# Patient Record
Sex: Male | Born: 1960 | Race: Black or African American | Hispanic: No | Marital: Married | Smoking: Light tobacco smoker
Health system: Southern US, Community
[De-identification: ages and names within clinical notes are randomized; demographics above are authoritative.]

## PROBLEM LIST (undated history)

## (undated) DIAGNOSIS — M1990 Unspecified osteoarthritis, unspecified site: Secondary | ICD-10-CM

## (undated) DIAGNOSIS — E785 Hyperlipidemia, unspecified: Secondary | ICD-10-CM

## (undated) DIAGNOSIS — I10 Essential (primary) hypertension: Secondary | ICD-10-CM

## (undated) DIAGNOSIS — T7840XA Allergy, unspecified, initial encounter: Secondary | ICD-10-CM

## (undated) DIAGNOSIS — R634 Abnormal weight loss: Secondary | ICD-10-CM

## (undated) DIAGNOSIS — E119 Type 2 diabetes mellitus without complications: Secondary | ICD-10-CM

## (undated) HISTORY — DX: Allergy, unspecified, initial encounter: T78.40XA

## (undated) HISTORY — PX: COLONOSCOPY: SHX174

## (undated) HISTORY — PX: LAPAROSCOPIC GASTRIC SLEEVE RESECTION: SHX5895

---

## 2010-07-23 ENCOUNTER — Inpatient Hospital Stay (HOSPITAL_COMMUNITY): Admission: EM | Admit: 2010-07-23 | Discharge: 2010-07-27 | Payer: Self-pay | Admitting: Emergency Medicine

## 2010-08-10 ENCOUNTER — Encounter: Admission: RE | Admit: 2010-08-10 | Discharge: 2010-08-20 | Payer: Self-pay | Admitting: Internal Medicine

## 2011-02-04 LAB — DIFFERENTIAL
Eosinophils Absolute: 0 10*3/uL (ref 0.0–0.7)
Eosinophils Relative: 0 % (ref 0–5)
Lymphs Abs: 2.7 10*3/uL (ref 0.7–4.0)
Monocytes Relative: 8 % (ref 3–12)
Neutrophils Relative %: 65 % (ref 43–77)

## 2011-02-04 LAB — GLUCOSE, CAPILLARY
Glucose-Capillary: 156 mg/dL — ABNORMAL HIGH (ref 70–99)
Glucose-Capillary: 163 mg/dL — ABNORMAL HIGH (ref 70–99)
Glucose-Capillary: 163 mg/dL — ABNORMAL HIGH (ref 70–99)
Glucose-Capillary: 177 mg/dL — ABNORMAL HIGH (ref 70–99)
Glucose-Capillary: 185 mg/dL — ABNORMAL HIGH (ref 70–99)
Glucose-Capillary: 187 mg/dL — ABNORMAL HIGH (ref 70–99)
Glucose-Capillary: 201 mg/dL — ABNORMAL HIGH (ref 70–99)
Glucose-Capillary: 228 mg/dL — ABNORMAL HIGH (ref 70–99)
Glucose-Capillary: 237 mg/dL — ABNORMAL HIGH (ref 70–99)
Glucose-Capillary: 274 mg/dL — ABNORMAL HIGH (ref 70–99)
Glucose-Capillary: 295 mg/dL — ABNORMAL HIGH (ref 70–99)
Glucose-Capillary: 323 mg/dL — ABNORMAL HIGH (ref 70–99)
Glucose-Capillary: 372 mg/dL — ABNORMAL HIGH (ref 70–99)
Glucose-Capillary: 395 mg/dL — ABNORMAL HIGH (ref 70–99)
Glucose-Capillary: 395 mg/dL — ABNORMAL HIGH (ref 70–99)
Glucose-Capillary: 411 mg/dL — ABNORMAL HIGH (ref 70–99)
Glucose-Capillary: 424 mg/dL — ABNORMAL HIGH (ref 70–99)
Glucose-Capillary: 532 mg/dL — ABNORMAL HIGH (ref 70–99)
Glucose-Capillary: 579 mg/dL (ref 70–99)
Glucose-Capillary: >600 mg/dL (ref 70–99)

## 2011-02-04 LAB — POCT I-STAT, CHEM 8
Creatinine, Ser: 1.6 mg/dL — ABNORMAL HIGH (ref 0.4–1.5)
Glucose, Bld: 674 mg/dL (ref 70–99)
Hemoglobin: 15.3 g/dL (ref 13.0–17.0)
Potassium: 3.9 mEq/L (ref 3.5–5.1)

## 2011-02-04 LAB — CBC
HCT: 34.7 % — ABNORMAL LOW (ref 39.0–52.0)
HCT: 36.1 % — ABNORMAL LOW (ref 39.0–52.0)
Hemoglobin: 13.9 g/dL (ref 13.0–17.0)
MCH: 27.9 pg (ref 26.0–34.0)
MCHC: 34.3 g/dL (ref 30.0–36.0)
MCHC: 34.3 g/dL (ref 30.0–36.0)
MCV: 77.8 fL — ABNORMAL LOW (ref 78.0–100.0)
MCV: 78 fL (ref 78.0–100.0)
MCV: 81.2 fL (ref 78.0–100.0)
Platelets: 178 10*3/uL (ref 150–400)
Platelets: 185 10*3/uL (ref 150–400)
RBC: 4.99 MIL/uL (ref 4.22–5.81)
RDW: 14.5 % (ref 11.5–15.5)
RDW: 14.6 % (ref 11.5–15.5)
WBC: 9.6 10*3/uL (ref 4.0–10.5)

## 2011-02-04 LAB — BASIC METABOLIC PANEL
BUN: 14 mg/dL (ref 6–23)
CO2: 27 mEq/L (ref 19–32)
Calcium: 9.9 mg/dL (ref 8.4–10.5)
Chloride: 106 mEq/L (ref 96–112)
Creatinine, Ser: 1.13 mg/dL (ref 0.4–1.5)
GFR calc Af Amer: 46 mL/min — ABNORMAL LOW (ref >60)
GFR calc Af Amer: >60 mL/min (ref >60)
GFR calc non Af Amer: 38 mL/min — ABNORMAL LOW (ref >60)
GFR calc non Af Amer: >60 mL/min (ref >60)
Glucose, Bld: 374 mg/dL — ABNORMAL HIGH (ref 70–99)
Potassium: 3.3 mEq/L — ABNORMAL LOW (ref 3.5–5.1)
Potassium: 4 mEq/L (ref 3.5–5.1)
Sodium: 136 mEq/L (ref 135–145)
Sodium: 139 mEq/L (ref 135–145)

## 2011-02-04 LAB — CARDIAC PANEL(CRET KIN+CKTOT+MB+TROPI)
CK, MB: 4.8 ng/mL — ABNORMAL HIGH (ref 0.3–4.0)
Relative Index: 1.6 (ref 0.0–2.5)
Total CK: 291 U/L — ABNORMAL HIGH (ref 7–232)
Troponin I: 0.03 ng/mL (ref 0.00–0.06)
Troponin I: 0.03 ng/mL (ref 0.00–0.06)

## 2011-02-04 LAB — HEPATIC FUNCTION PANEL
Albumin: 3.6 g/dL (ref 3.5–5.2)
Alkaline Phosphatase: 59 U/L (ref 39–117)
Indirect Bilirubin: 0.8 mg/dL (ref 0.3–0.9)
Total Bilirubin: 1 mg/dL (ref 0.3–1.2)
Total Protein: 6.6 g/dL (ref 6.0–8.3)

## 2011-02-04 LAB — LIPID PANEL
Cholesterol: 191 mg/dL (ref 0–200)
HDL: 37 mg/dL — ABNORMAL LOW (ref >39)
LDL Cholesterol: 121 mg/dL — ABNORMAL HIGH (ref 0–99)
Total CHOL/HDL Ratio: 5.2 RATIO

## 2011-02-04 LAB — URINALYSIS, ROUTINE W REFLEX MICROSCOPIC
Ketones, ur: 40 mg/dL — AB
Leukocytes, UA: NEGATIVE
Nitrite: NEGATIVE
Protein, ur: NEGATIVE mg/dL

## 2011-02-04 LAB — KETONES, QUALITATIVE

## 2011-02-04 LAB — HEMOGLOBIN A1C: Mean Plasma Glucose: 358 mg/dL — ABNORMAL HIGH (ref <117)

## 2011-04-13 ENCOUNTER — Ambulatory Visit (HOSPITAL_COMMUNITY)
Admission: RE | Admit: 2011-04-13 | Discharge: 2011-04-13 | Disposition: A | Discharge status: Home / Self Care | Payer: BC Managed Care – PPO | Source: Ambulatory Visit | Attending: Cardiology | Admitting: Cardiology

## 2011-04-13 DIAGNOSIS — Z01810 Encounter for preprocedural cardiovascular examination: Secondary | ICD-10-CM | POA: Insufficient documentation

## 2011-04-13 DIAGNOSIS — E119 Type 2 diabetes mellitus without complications: Secondary | ICD-10-CM | POA: Insufficient documentation

## 2011-04-13 DIAGNOSIS — E785 Hyperlipidemia, unspecified: Secondary | ICD-10-CM | POA: Insufficient documentation

## 2011-04-13 DIAGNOSIS — I10 Essential (primary) hypertension: Secondary | ICD-10-CM | POA: Insufficient documentation

## 2011-04-13 LAB — GLUCOSE, CAPILLARY: Glucose-Capillary: 148 mg/dL — ABNORMAL HIGH (ref 70–99)

## 2011-05-18 NOTE — Procedures (Signed)
  NAMEELROY, SCHEMBRI NO.:  [id number]  MEDICAL RECORD NO.:  [id number]           PATIENT TYPE:  O  LOCATION:  [facility]                         FACILITY:  [facility]  PHYSICIAN:  Pamella Pert, MD DATE OF BIRTH:  1961/10/02  DATE OF PROCEDURE:  04/13/2011 DATE OF DISCHARGE:                   PERIPHERAL VASCULAR INVASIVE PROCEDURE   PROCEDURES PERFORMED: 1. Abdominal aortogram. 2. Selective right and left renal arteriogram.  INDICATION:  Mr. [name] is a pleasant 50 year old gentleman who is morbid obesity with the BMI of 48, his height is 79 inches.  He is having difficulty in controlling his hypertension.  He also has diabetes, hyperlipidemia.  In spite of him being on more than 6 medications, he continues to have difficult to control hypertension. Hence he was brought to the Peripheral [facility] to evaluate for renovascular hypertension.  Abdominal aortogram.  Abdominal aortogram reveals presence of 2 renal arteries.  These were extremely difficult to visualize in spite of pigtail injection of 30 mL of contrast.  Selective right and left arteriography.  There was 1 renal artery on each side.  They were widely patent.  There was no evidence of renal artery stenosis.  A total of 100 mL of contrast was utilized for diagnostic angiography.  Please note that procedure was performed via radial access.  A 5-French 125 cm pigtail and a 125 cm JR-6 diagnostic catheters were utilized for performing angiography.  TECHNIQUE OF THE PROCEDURE:  Under sterile precautions using a 6 French right radial access, a 125 cm pigtail catheter was advanced in the abdominal aorta and the abdominal aortogram was performed  to well visualize the renal artery and to selectively engage the renal artery, a 125 cm JR-4 diagnostic catheter 5 [place] was utilized. Selective right and left renal arteriography was performed.  The catheter was then pulled out of the body over an  exchange length J-wire. The patient tolerated the procedure well.  Hemostasis was obtained by applying TR band.     Pamella Pert, MD    JRG/MEDQ  D:  04/13/2011  T:  04/13/2011  Job:  [id number]  cc:   Candyce Churn. Allyne Gee, M.D.  Electronically Signed by Yates Decamp MD on 05/18/2011 09:28:06 AM

## 2013-11-08 ENCOUNTER — Other Ambulatory Visit: Payer: Self-pay | Admitting: Neurosurgery

## 2013-11-08 DIAGNOSIS — R2689 Other abnormalities of gait and mobility: Secondary | ICD-10-CM

## 2014-07-15 ENCOUNTER — Encounter (HOSPITAL_COMMUNITY): Payer: Self-pay | Admitting: Emergency Medicine

## 2014-07-15 ENCOUNTER — Emergency Department (HOSPITAL_COMMUNITY): Payer: BC Managed Care – PPO

## 2014-07-15 ENCOUNTER — Observation Stay (HOSPITAL_COMMUNITY)
Admission: EM | Admit: 2014-07-15 | Discharge: 2014-07-17 | Disposition: A | Discharge status: Home / Self Care | Payer: BC Managed Care – PPO | Attending: Internal Medicine | Admitting: Internal Medicine

## 2014-07-15 DIAGNOSIS — Y9389 Activity, other specified: Secondary | ICD-10-CM | POA: Insufficient documentation

## 2014-07-15 DIAGNOSIS — R42 Dizziness and giddiness: Secondary | ICD-10-CM | POA: Diagnosis not present

## 2014-07-15 DIAGNOSIS — R55 Syncope and collapse: Secondary | ICD-10-CM | POA: Diagnosis present

## 2014-07-15 DIAGNOSIS — R404 Transient alteration of awareness: Secondary | ICD-10-CM | POA: Insufficient documentation

## 2014-07-15 DIAGNOSIS — Z043 Encounter for examination and observation following other accident: Principal | ICD-10-CM | POA: Insufficient documentation

## 2014-07-15 DIAGNOSIS — I10 Essential (primary) hypertension: Secondary | ICD-10-CM | POA: Diagnosis not present

## 2014-07-15 DIAGNOSIS — E876 Hypokalemia: Secondary | ICD-10-CM | POA: Insufficient documentation

## 2014-07-15 DIAGNOSIS — Y92410 Unspecified street and highway as the place of occurrence of the external cause: Secondary | ICD-10-CM | POA: Insufficient documentation

## 2014-07-15 DIAGNOSIS — Z7982 Long term (current) use of aspirin: Secondary | ICD-10-CM | POA: Diagnosis not present

## 2014-07-15 DIAGNOSIS — R001 Bradycardia, unspecified: Secondary | ICD-10-CM | POA: Diagnosis present

## 2014-07-15 DIAGNOSIS — E119 Type 2 diabetes mellitus without complications: Secondary | ICD-10-CM | POA: Diagnosis not present

## 2014-07-15 DIAGNOSIS — Z79899 Other long term (current) drug therapy: Secondary | ICD-10-CM | POA: Insufficient documentation

## 2014-07-15 DIAGNOSIS — I498 Other specified cardiac arrhythmias: Secondary | ICD-10-CM | POA: Insufficient documentation

## 2014-07-15 HISTORY — DX: Type 2 diabetes mellitus without complications: E11.9

## 2014-07-15 HISTORY — DX: Essential (primary) hypertension: I10

## 2014-07-15 HISTORY — DX: Hyperlipidemia, unspecified: E78.5

## 2014-07-15 LAB — COMPREHENSIVE METABOLIC PANEL
ALBUMIN: 3.7 g/dL (ref 3.5–5.2)
ALT: 28 U/L (ref 0–53)
ANION GAP: 11 (ref 5–15)
AST: 34 U/L (ref 0–37)
Alkaline Phosphatase: 60 U/L (ref 39–117)
BUN: 19 mg/dL (ref 6–23)
CALCIUM: 9.3 mg/dL (ref 8.4–10.5)
CO2: 30 mEq/L (ref 19–32)
CREATININE: 1.23 mg/dL (ref 0.50–1.35)
Chloride: 101 mEq/L (ref 96–112)
GFR calc Af Amer: 75 mL/min — ABNORMAL LOW (ref >90)
GFR, EST NON AFRICAN AMERICAN: 65 mL/min — AB (ref >90)
Glucose, Bld: 105 mg/dL — ABNORMAL HIGH (ref 70–99)
Potassium: 3.5 mEq/L — ABNORMAL LOW (ref 3.7–5.3)
Sodium: 142 mEq/L (ref 137–147)
Total Bilirubin: 1.1 mg/dL (ref 0.3–1.2)
Total Protein: 6.7 g/dL (ref 6.0–8.3)

## 2014-07-15 LAB — CBC WITH DIFFERENTIAL/PLATELET
BASOS ABS: 0 10*3/uL (ref 0.0–0.1)
BASOS PCT: 0 % (ref 0–1)
EOS PCT: 1 % (ref 0–5)
Eosinophils Absolute: 0 10*3/uL (ref 0.0–0.7)
HEMATOCRIT: 40.4 % (ref 39.0–52.0)
Hemoglobin: 13.8 g/dL (ref 13.0–17.0)
Lymphocytes Relative: 32 % (ref 12–46)
Lymphs Abs: 1.7 10*3/uL (ref 0.7–4.0)
MCH: 28.6 pg (ref 26.0–34.0)
MCHC: 34.2 g/dL (ref 30.0–36.0)
MCV: 83.8 fL (ref 78.0–100.0)
MONO ABS: 0.6 10*3/uL (ref 0.1–1.0)
MONOS PCT: 12 % (ref 3–12)
Neutro Abs: 2.8 10*3/uL (ref 1.7–7.7)
Neutrophils Relative %: 55 % (ref 43–77)
Platelets: 152 10*3/uL (ref 150–400)
RBC: 4.82 MIL/uL (ref 4.22–5.81)
RDW: 13.9 % (ref 11.5–15.5)
WBC: 5.2 10*3/uL (ref 4.0–10.5)

## 2014-07-15 LAB — CK: CK TOTAL: 314 U/L — AB (ref 7–232)

## 2014-07-15 LAB — TROPONIN I: Troponin I: <0.3 ng/mL (ref <0.30)

## 2014-07-15 LAB — I-STAT CG4 LACTIC ACID, ED: LACTIC ACID, VENOUS: 0.44 mmol/L — AB (ref 0.5–2.2)

## 2014-07-15 MED ORDER — SODIUM CHLORIDE 0.9 % IJ SOLN
3.0000 mL | Freq: Two times a day (BID) | INTRAMUSCULAR | Status: DC
  Administered 2014-07-15 – 2014-07-17 (×4): 3 mL via INTRAVENOUS

## 2014-07-15 MED ORDER — HYDRALAZINE HCL 20 MG/ML IJ SOLN
10.0000 mg | INTRAMUSCULAR | Status: DC | PRN
  Administered 2014-07-15: 10 mg via INTRAVENOUS
  Filled 2014-07-15: qty 1

## 2014-07-15 MED ORDER — ONDANSETRON HCL 4 MG/2ML IJ SOLN: 4.0000 mg | Freq: Four times a day (QID) | INTRAMUSCULAR | Status: DC | PRN

## 2014-07-15 MED ORDER — HYDROCODONE-ACETAMINOPHEN 5-325 MG PO TABS: 1.0000 | ORAL_TABLET | ORAL | Status: DC | PRN

## 2014-07-15 MED ORDER — ONDANSETRON HCL 4 MG PO TABS: 4.0000 mg | ORAL_TABLET | Freq: Four times a day (QID) | ORAL | Status: DC | PRN

## 2014-07-15 MED ORDER — SODIUM CHLORIDE 0.9 % IV SOLN
Freq: Once | INTRAVENOUS | Status: AC
  Administered 2014-07-15: 20 mL/h via INTRAVENOUS

## 2014-07-15 MED ORDER — ACETAMINOPHEN 325 MG PO TABS: 650.0000 mg | ORAL_TABLET | Freq: Four times a day (QID) | ORAL | Status: DC | PRN

## 2014-07-15 MED ORDER — CHLORTHALIDONE 25 MG PO TABS
25.0000 mg | ORAL_TABLET | Freq: Every day | ORAL | Status: DC
  Administered 2014-07-16 – 2014-07-17 (×2): 25 mg via ORAL
  Filled 2014-07-15 (×2): qty 1

## 2014-07-15 MED ORDER — AMLODIPINE BESYLATE 5 MG PO TABS
5.0000 mg | ORAL_TABLET | Freq: Every day | ORAL | Status: DC
  Administered 2014-07-16: 5 mg via ORAL
  Filled 2014-07-15 (×2): qty 1

## 2014-07-15 MED ORDER — HEPARIN SODIUM (PORCINE) 5000 UNIT/ML IJ SOLN
5000.0000 [IU] | Freq: Three times a day (TID) | INTRAMUSCULAR | Status: DC
  Administered 2014-07-16 – 2014-07-17 (×4): 5000 [IU] via SUBCUTANEOUS
  Filled 2014-07-15 (×6): qty 1

## 2014-07-15 MED ORDER — SPIRONOLACTONE 25 MG PO TABS
25.0000 mg | ORAL_TABLET | Freq: Every day | ORAL | Status: DC
  Administered 2014-07-16 – 2014-07-17 (×2): 25 mg via ORAL
  Filled 2014-07-15 (×2): qty 1

## 2014-07-15 MED ORDER — ACETAMINOPHEN 650 MG RE SUPP: 650.0000 mg | Freq: Four times a day (QID) | RECTAL | Status: DC | PRN

## 2014-07-15 MED ORDER — NEBIVOLOL HCL 10 MG PO TABS
10.0000 mg | ORAL_TABLET | Freq: Every day | ORAL | Status: DC
  Administered 2014-07-16: 10 mg via ORAL
  Filled 2014-07-15 (×2): qty 1
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
## 2014-07-15 NOTE — ED Notes (Signed)
I Stat Lactic Acid results shown to Dr. Peggye Pitt
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
## 2014-07-16 ENCOUNTER — Encounter (HOSPITAL_COMMUNITY): Payer: Self-pay | Admitting: Cardiology

## 2014-07-16 DIAGNOSIS — R404 Transient alteration of awareness: Secondary | ICD-10-CM | POA: Diagnosis not present

## 2014-07-16 DIAGNOSIS — Z043 Encounter for examination and observation following other accident: Secondary | ICD-10-CM | POA: Diagnosis not present

## 2014-07-16 DIAGNOSIS — I10 Essential (primary) hypertension: Secondary | ICD-10-CM | POA: Diagnosis not present

## 2014-07-16 DIAGNOSIS — R55 Syncope and collapse: Secondary | ICD-10-CM | POA: Diagnosis not present

## 2014-07-16 LAB — LIPID PANEL
Cholesterol: 149 mg/dL (ref 0–200)
HDL: 77 mg/dL (ref >39)
LDL CALC: 60 mg/dL (ref 0–99)
Total CHOL/HDL Ratio: 1.9 RATIO
Triglycerides: 59 mg/dL (ref <150)
VLDL: 12 mg/dL (ref 0–40)

## 2014-07-16 LAB — BASIC METABOLIC PANEL
Anion gap: 11 (ref 5–15)
BUN: 16 mg/dL (ref 6–23)
CO2: 26 mEq/L (ref 19–32)
Calcium: 8.9 mg/dL (ref 8.4–10.5)
Chloride: 102 mEq/L (ref 96–112)
Creatinine, Ser: 0.97 mg/dL (ref 0.50–1.35)
GFR calc Af Amer: >90 mL/min (ref >90)
GLUCOSE: 73 mg/dL (ref 70–99)
POTASSIUM: 3.4 meq/L — AB (ref 3.7–5.3)
Sodium: 139 mEq/L (ref 137–147)

## 2014-07-16 LAB — RAPID URINE DRUG SCREEN, HOSP PERFORMED
Amphetamines: NOT DETECTED
BARBITURATES: NOT DETECTED
Benzodiazepines: NOT DETECTED
COCAINE: NOT DETECTED
Opiates: NOT DETECTED
TETRAHYDROCANNABINOL: NOT DETECTED

## 2014-07-16 LAB — GLUCOSE, CAPILLARY: GLUCOSE-CAPILLARY: 111 mg/dL — AB (ref 70–99)

## 2014-07-16 LAB — HEMOGLOBIN A1C
Hgb A1c MFr Bld: 5.1 % (ref <5.7)
Mean Plasma Glucose: 100 mg/dL (ref <117)

## 2014-07-16 MED ORDER — POTASSIUM CHLORIDE CRYS ER 20 MEQ PO TBCR
40.0000 meq | EXTENDED_RELEASE_TABLET | Freq: Once | ORAL | Status: AC
  Administered 2014-07-16: 40 meq via ORAL
  Filled 2014-07-16: qty 2
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
## 2014-07-16 NOTE — Progress Notes (Signed)
  Echocardiogram 2D Echocardiogram has been performed.  Mauricio Po 07/16/2014, 12:55 PM

## 2014-07-17 DIAGNOSIS — I498 Other specified cardiac arrhythmias: Secondary | ICD-10-CM

## 2014-07-17 DIAGNOSIS — I10 Essential (primary) hypertension: Secondary | ICD-10-CM | POA: Diagnosis present

## 2014-07-17 DIAGNOSIS — R001 Bradycardia, unspecified: Secondary | ICD-10-CM | POA: Diagnosis present

## 2014-07-17 MED ORDER — AMLODIPINE BESYLATE 10 MG PO TABS: 10.0000 mg | ORAL_TABLET | Freq: Every day | ORAL | Status: DC

## 2014-07-17 MED ORDER — LISINOPRIL 5 MG PO TABS: 5.0000 mg | ORAL_TABLET | Freq: Every day | ORAL | Status: DC

## 2014-07-17 MED ORDER — AMLODIPINE BESYLATE 10 MG PO TABS
10.0000 mg | ORAL_TABLET | Freq: Every day | ORAL | Status: DC
  Administered 2014-07-17: 10 mg via ORAL
  Filled 2014-07-17: qty 1

## 2014-07-17 MED ORDER — LISINOPRIL 5 MG PO TABS
5.0000 mg | ORAL_TABLET | Freq: Every day | ORAL | Status: DC
  Administered 2014-07-17: 5 mg via ORAL
  Filled 2014-07-17: qty 1
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
## 2014-07-17 NOTE — Discharge Instructions (Signed)
No driving for 6 months per Dr. Einar Gip  Cardiac Diet This diet can help prevent heart disease and stroke. Many factors influence your heart health, including eating and exercise habits. Coronary risk rises a lot with abnormal blood fat (lipid) levels. Cardiac meal planning includes limiting unhealthy fats, increasing healthy fats, and making other small dietary changes. General guidelines are as follows:  Adjust calorie intake to reach and maintain desirable body weight.  Limit total fat intake to less than 30% of total calories. Saturated fat should be less than 7% of calories.  Saturated fats are found in animal products and in some vegetable products. Saturated vegetable fats are found in coconut oil, cocoa butter, palm oil, and palm kernel oil. Read labels carefully to avoid these products as much as possible. Use butter in moderation. Choose tub margarines and oils that have 2 grams of fat or less. Good cooking oils are canola and olive oils.  Practice low-fat cooking techniques. Do not fry food. Instead, broil, bake, boil, steam, grill, roast on a rack, stir-fry, or microwave it. Other fat reducing suggestions include:  Remove the skin from poultry.  Remove all visible fat from meats.  Skim the fat off stews, soups, and gravies before serving them.  Steam vegetables in water or broth instead of sauting them in fat.  Avoid foods with trans fat (or hydrogenated oils), such as commercially fried foods and commercially baked goods. Commercial shortening and deep-frying fats will contain trans fat.  Increase intake of fruits, vegetables, whole grains, and legumes to replace foods high in fat.  Increase consumption of nuts, legumes, and seeds to at least 4 servings weekly. One serving of a legume equals  cup, and 1 serving of nuts or seeds equals  cup.  Choose whole grains more often. Have 3 servings per day (a serving is 1 ounce [oz]).  Eat 4 to 5 servings of vegetables per day. A  serving of vegetables is 1 cup of raw leafy vegetables;  cup of raw or cooked cut-up vegetables;  cup of vegetable juice.  Eat 4 to 5 servings of fruit per day. A serving of fruit is 1 medium whole fruit;  cup of dried fruit;  cup of fresh, frozen, or canned fruit;  cup of 100% fruit juice.  Increase your intake of dietary fiber to 20 to 30 grams per day. Insoluble fiber may help lower your risk of heart disease and may help curb your appetite.  Soluble fiber binds cholesterol to be removed from the blood. Foods high in soluble fiber are dried beans, citrus fruits, oats, apples, bananas, broccoli, Brussels sprouts, and eggplant.  Try to include foods fortified with plant sterols or stanols, such as yogurt, breads, juices, or margarines. Choose several fortified foods to achieve a daily intake of 2 to 3 grams of plant sterols or stanols.  Foods with omega-3 fats can help reduce your risk of heart disease. Aim to have a 3.5 oz portion of fatty fish twice per week, such as salmon, mackerel, albacore tuna, sardines, lake trout, or herring. If you wish to take a fish oil supplement, choose one that contains 1 gram of both DHA and EPA.  Limit processed meats to 2 servings (3 oz portion) weekly.  Limit the sodium in your diet to 1500 milligrams (mg) per day. If you have high blood pressure, talk to a registered dietitian about a DASH (Dietary Approaches to Stop Hypertension) eating plan.  Limit sweets and beverages with added sugar, such as soda,  to no more than 5 servings per week. One serving is:   1 tablespoon sugar.  1 tablespoon jelly or jam.   cup sorbet.  1 cup lemonade.   cup regular soda. CHOOSING FOODS Starches  Allowed: Breads: All kinds (wheat, rye, raisin, white, oatmeal, [place], [place], and English muffin bread). Low-fat rolls: English muffins, frankfurter and hamburger buns, bagels, pita bread, tortillas (not fried). Pancakes, waffles, biscuits, and muffins made with  recommended oil.  Avoid: Products made with saturated or trans fats, oils, or whole milk products. Butter rolls, cheese breads, croissants. Commercial doughnuts, muffins, sweet rolls, biscuits, waffles, pancakes, store-bought mixes. Crackers  Allowed: Low-fat crackers and snacks: Animal, graham, rye, saltine (with recommended oil, no lard), oyster, and matzo crackers. Bread sticks, melba toast, rusks, flatbread, pretzels, and light popcorn.  Avoid: High-fat crackers: cheese crackers, butter crackers, and those made with coconut, palm oil, or trans fat (hydrogenated oils). Buttered popcorn. Cereals  Allowed: Hot or cold whole-grain cereals.  Avoid: Cereals containing coconut, hydrogenated vegetable fat, or animal fat. Potatoes / Pasta / Rice  Allowed: All kinds of potatoes, rice, and pasta (such as macaroni, spaghetti, and noodles).  Avoid: Pasta or rice prepared with cream sauce or high-fat cheese. Chow mein noodles, [place] fries. Vegetables  Allowed: All vegetables and vegetable juices.  Avoid: Fried vegetables. Vegetables in cream, butter, or high-fat cheese sauces. Limit coconut. Fruit in cream or custard. Protein  Allowed: Limit your intake of meat, seafood, and poultry to no more than 6 oz (cooked weight) per day. All lean, well-trimmed beef, veal, pork, and lamb. All chicken and [place] without skin. All fish and shellfish. Wild game: wild duck, rabbit, pheasant, and venison. Egg whites or low-cholesterol egg substitutes may be used as desired. Meatless dishes: recipes with dried beans, peas, lentils, and tofu (soybean curd). Seeds and nuts: all seeds and most nuts.  Avoid: Prime grade and other heavily marbled and fatty meats, such as short ribs, spare ribs, rib eye roast or steak, frankfurters, sausage, bacon, and high-fat luncheon meats, mutton. Caviar. Commercially fried fish. Domestic duck, goose, venison sausage. Organ meats: liver, gizzard, heart, chitterlings, brains, kidney,  sweetbreads. Dairy  Allowed: Low-fat cheeses: nonfat or low-fat cottage cheese (1% or 2% fat), cheeses made with part skim milk, such as mozzarella, farmers, string, or ricotta. (Cheeses should be labeled no more than 2 to 6 grams fat per oz.). Skim (or 1%) milk: liquid, powdered, or evaporated. Buttermilk made with low-fat milk. Drinks made with skim or low-fat milk or cocoa. Chocolate milk or cocoa made with skim or low-fat (1%) milk. Nonfat or low-fat yogurt.  Avoid: Whole milk cheeses, including colby, cheddar, [place], Monterey Jack, [place], [place], [place], American, Swiss, and blue. Creamed cottage cheese, cream cheese. Whole milk and whole milk products, including buttermilk or yogurt made from whole milk, drinks made from whole milk. Condensed milk, evaporated whole milk, and 2% milk. Soups and Combination Foods  Allowed: Low-fat low-sodium soups: broth, dehydrated soups, homemade broth, soups with the fat removed, homemade cream soups made with skim or low-fat milk. Low-fat spaghetti, lasagna, chili, and Spanish rice if low-fat ingredients and low-fat cooking techniques are used.  Avoid: Cream soups made with whole milk, cream, or high-fat cheese. All other soups. Desserts and Sweets  Allowed: Sherbet, fruit ices, gelatins, meringues, and angel food cake. Homemade desserts with recommended fats, oils, and milk products. Jam, jelly, honey, marmalade, sugars, and syrups. Pure sugar candy, such as gum drops, hard candy, jelly beans, marshmallows, mints, and  small amounts of dark chocolate.  Avoid: Commercially prepared cakes, pies, cookies, frosting, pudding, or mixes for these products. Desserts containing whole milk products, chocolate, coconut, lard, palm oil, or palm kernel oil. Ice cream or ice cream drinks. Candy that contains chocolate, coconut, butter, hydrogenated fat, or unknown ingredients. Buttered syrups. Fats and Oils  Allowed: Vegetable oils: safflower, sunflower, corn,  soybean, cottonseed, sesame, canola, olive, or peanut. Non-hydrogenated margarines. Salad dressing or mayonnaise: homemade or commercial, made with a recommended oil. Low or nonfat salad dressing or mayonnaise.  Limit added fats and oils to 6 to 8 tsp per day (includes fats used in cooking, baking, salads, and spreads on bread). Remember to count the "hidden fats" in foods.  Avoid: Solid fats and shortenings: butter, lard, salt pork, bacon drippings. Gravy containing meat fat, shortening, or suet. Cocoa butter, coconut. Coconut oil, palm oil, palm kernel oil, or hydrogenated oils: these ingredients are often used in bakery products, nondairy creamers, whipped toppings, candy, and commercially fried foods. Read labels carefully. Salad dressings made of unknown oils, sour cream, or cheese, such as blue cheese and Roquefort. Cream, all kinds: half-and-half, light, heavy, or whipping. Sour cream or cream cheese (even if "light" or low-fat). Nondairy cream substitutes: coffee creamers and sour cream substitutes made with palm, palm kernel, hydrogenated oils, or coconut oil. Beverages  Allowed: Coffee (regular or decaffeinated), tea. Diet carbonated beverages, mineral water. Alcohol: Check with your caregiver. Moderation is recommended.  Avoid: Whole milk, regular sodas, and juice drinks with added sugar. Condiments  Allowed: All seasonings and condiments. Cocoa powder. "Cream" sauces made with recommended ingredients.  Avoid: Carob powder made with hydrogenated fats. SAMPLE MENU Breakfast   cup orange juice   cup oatmeal  1 slice toast  1 tsp margarine  1 cup skim milk Lunch  [place] sandwich with 2 oz [place], 2 slices bread  Lettuce and tomato slices  Fresh fruit  Carrot sticks  Coffee or tea Snack  Fresh fruit or low-fat crackers Dinner  3 oz lean ground beef  1 baked potato  1 tsp margarine   cup asparagus  Lettuce salad  1 tbs non-creamy dressing   cup peach  slices  1 cup skim milk Document Released: 08/17/2008 Document Revised: 05/09/2012 Document Reviewed: 01/08/2014 [employer] Patient Information 2015 [place], [place]. This information is not intended to replace advice given to you by your health care provider. Make sure you discuss any questions you have with your health care provider.   Holter Monitoring A Holter monitor is a small device with electrodes (small sticky patches) that attach to your chest. It records the electrical activity of your heart and is worn continuously for 24-48 hours.  A HOLTER MONITOR IS USED TO  Detect heart problems such as:  Heart arrhythmia. Is an abnormal or irregular heartbeat. With some heart arrhythmias, you may not feel or know that you have an irregular heart rhythm.  Palpitations, such as feeling your heart racing or fluttering. It is possible to have heart palpitations and not have a heart arrhythmia.  A heart rhythm that is too slow or too fast.  If you have problems fainting, near fainting or feeling light-headed, a Holter monitor may be worn to see if your heart is the cause. HOLTER MONITOR PREPARATION   Electrodes will be attached to the skin on your chest.  If you have hair on your chest, small areas may have to be shaved. This is done to help the patches stick better and make the  recording more accurate.  The electrodes are attached by wires to the Holter monitor. The Holter monitor clips to your clothing. You will wear the monitor at all times, even while exercising and sleeping. HOME CARE INSTRUCTIONS   Wear your monitor at all times.  The wires and the monitor must stay dry. Do not get the monitor wet.  Do not bathe, swim or use a hot tub with it on.  You may do a "sponge" bath while you have the monitor on.  Keep your skin clean, do not put body lotion or moisturizer on your chest.  It's possible that your skin under the electrodes could become irritated. To keep this from  happening, you may put the electrodes in slightly different places on your chest.  Your caregiver will also ask you to keep a diary of your activities, such as walking or doing chores. Be sure to note what you are doing if you experience heart symptoms such as palpitations. This will help your caregiver determine what might be contributing to your symptoms. The information stored in your monitor will be reviewed by your caregiver alongside your diary entries.  Make sure the monitor is safely clipped to your clothing or in a location close to your body that your caregiver recommends.  The monitor and electrodes are removed when the test is over. Return the monitor as directed.  Be sure to follow up with your caregiver and discuss your Holter monitor results. SEEK IMMEDIATE MEDICAL CARE IF:  You faint or feel lightheaded.  You have trouble breathing.  You get pain in your chest, upper arm or jaw.  You feel sick to your stomach and your skin is pale, cool, or damp.  You think something is wrong with the way your heart is beating. MAKE SURE YOU:   Understand these instructions.  Will watch your condition.  Will get help right away if you are not doing well or get worse. Document Released: 08/06/2004 Document Revised: 01/31/2012 Document Reviewed: 12/19/2008 [facility] Patient Information 2015 [place], [place]. This information is not intended to replace advice given to you by your health care provider. Make sure you discuss any questions you have with your health care provider.

## 2014-07-17 NOTE — Progress Notes (Signed)
Subjective:  Doing well. No dizziness or palpitations  Objective:  Vital Signs in the last 24 hours: Temp:  [98.5 F (36.9 C)-98.8 F (37.1 C)] 98.5 F (36.9 C) (08/26 0420) Pulse Rate:  [46-62] 46 (08/26 0420) Resp:  [16-18] 18 (08/25 2130) BP: (142-161)/(77-93) 151/91 mmHg (08/26 0420) SpO2:  [100 %] 100 % (08/26 0420) Weight:  [121.5 kg (267 lb 13.7 oz)] 121.5 kg (267 lb 13.7 oz) (08/26 0420) Orthostatic negative   Intake/Output from previous day: 08/25 0701 - 08/26 0700 In: 3 [I.V.:3] Out: 575 [Urine:575]  Physical Exam: General appearance: alert, cooperative, appears stated age and no distress  Lungs: clear to auscultation bilaterally  Heart: S1, S2 normal, S4 present and no rub  Abdomen: soft, non-tender; bowel sounds normal; no masses, no organomegaly  Extremities: extremities normal, atraumatic, no cyanosis or edema  Pulses: 2+ and symmetric  Neurologic: Grossly normal  Lab Results: BMP  Recent Labs  07/15/14 1835 07/16/14 0552  NA 142 139  K 3.5* 3.4*  CL 101 102  CO2 30 26  GLUCOSE 105* 73  BUN 19 16  CREATININE 1.23 0.97  CALCIUM 9.3 8.9  GFRNONAA 65* >90  GFRAA 75* >90    CBC  Recent Labs Lab 07/15/14 1835  WBC 5.2  RBC 4.82  HGB 13.8  HCT 40.4  PLT 152  MCV 83.8  MCH 28.6  MCHC 34.2  RDW 13.9  LYMPHSABS 1.7  MONOABS 0.6  EOSABS 0.0  BASOSABS 0.0    HEMOGLOBIN A1C Lab Results  Component Value Date   HGBA1C 5.1 07/16/2014   MPG 100 07/16/2014    Cardiac Panel (last 3 results)  Recent Labs  07/15/14 [place] <0.30    BNP (last 3 results) No results found for this basename: PROBNP,  in the last 8760 hours  TSH No results found for this basename: TSH,  in the last 8760 hours  CHOLESTEROL  Recent Labs  07/16/14 0535  CHOL 149    Hepatic Function Panel  Recent Labs  07/15/14 1835  PROT 6.7  ALBUMIN 3.7  AST 34  ALT 28  ALKPHOS 60  BILITOT 1.1   telemetry: Normal sinus rhythm, rare  PVC.    Assessment/Plan:   1. Syncope, no clear-cut explanation. Differential diagnosis include neurogenic syncope versus hypertension induced due to neurologic hypoperfusion from uncontrolled hypertension.  Echocardiogram 07/16/2014: essentially revealing mild LVH, EF 55-60% without any wall motion modality. There was no significant  2.  Hypertension  Recommendation: From cardiac standpoint he is stable for discharge.  I have recommended a 30 day event monitor,  Patient will arrive in our office today for placement of the event monitor patient advised to not to drive until cleared by cardiology for patient safety and Public Safety.  I will also consider performing a stress test to evaluate for myocardial ischemia given his prior cardiac vascular history specifically uncontrolled diabetes and uncontrolled hypertension.  All these have now resolved except for uncontrolled hypertension  Which persist due to noncompliance.  I will see him in the office following the tests.  Laverda Page, M.D. 07/17/2014, 10:17 AM [facility] Pager: [phone] Office: [phone] If no answer: [phone]

## 2016-10-05 ENCOUNTER — Other Ambulatory Visit: Payer: Self-pay | Admitting: Orthopedic Surgery

## 2016-10-05 DIAGNOSIS — M545 Low back pain, unspecified: Secondary | ICD-10-CM

## 2016-10-18 ENCOUNTER — Ambulatory Visit
Admission: RE | Admit: 2016-10-18 | Discharge: 2016-10-18 | Disposition: A | Discharge status: Home / Self Care | Payer: BLUE CROSS/BLUE SHIELD | Source: Ambulatory Visit | Attending: Orthopedic Surgery | Admitting: Orthopedic Surgery

## 2016-10-18 DIAGNOSIS — M545 Low back pain, unspecified: Secondary | ICD-10-CM

## 2016-11-01 ENCOUNTER — Ambulatory Visit: Payer: Self-pay | Admitting: Physician Assistant

## 2016-11-03 ENCOUNTER — Encounter: Payer: Self-pay | Admitting: Family Medicine

## 2016-11-03 ENCOUNTER — Ambulatory Visit (INDEPENDENT_AMBULATORY_CARE_PROVIDER_SITE_OTHER): Payer: BLUE CROSS/BLUE SHIELD | Admitting: Family Medicine

## 2016-11-03 VITALS — BP 154/92 | HR 98 | Temp 98.6°F | Resp 18 | Ht 79.0 in | Wt 258.0 lb

## 2016-11-03 DIAGNOSIS — Z0000 Encounter for general adult medical examination without abnormal findings: Secondary | ICD-10-CM

## 2016-11-03 DIAGNOSIS — I10 Essential (primary) hypertension: Secondary | ICD-10-CM

## 2016-11-03 DIAGNOSIS — Z125 Encounter for screening for malignant neoplasm of prostate: Secondary | ICD-10-CM | POA: Diagnosis not present

## 2016-11-03 DIAGNOSIS — Z01818 Encounter for other preprocedural examination: Secondary | ICD-10-CM

## 2016-11-03 NOTE — Patient Instructions (Signed)
     IF you received an x-ray today, you will receive an invoice from [facility]. Please contact [facility] at [phone] with questions or concerns regarding your invoice.   IF you received labwork today, you will receive an invoice from [facility]. Please contact [facility] at [phone] with questions or concerns regarding your invoice.   Our billing staff will not be able to assist you with questions regarding bills from these companies.  You will be contacted with the lab results as soon as they are available. The fastest way to get your results is to activate your My Chart account. Instructions are located on the last page of this paperwork. If you have not heard from us regarding the results in 2 weeks, please contact this office.      

## 2016-11-03 NOTE — Progress Notes (Signed)
Chief Complaint  Patient presents with  . Establish Care    surgical clearance    Subjective:  [name] is a 55 y.o. male here for a health maintenance visit.  Patient is established pt here for surgical clearance and general health maintenance exam.  Hypertension Pt reports that he was enrolled in a research trial at [facility] and was started on several medication to control his bp. He currently denies chest pains, palpitation and shortness of breath.  He denies medication side effects and is compliant with his meds. His bp typically runs between [id number] systolic which is down from 180s to 200s. He states that he sticks to a DASH diet  Patient Active Problem List   Diagnosis Date Noted  . Bradycardia 07/17/2014  . Essential hypertension 07/17/2014  . Syncope 07/15/2014    Past Medical History:  Diagnosis Date  . Diabetes mellitus without complication ([place]) Resolved 2014 after weight loss  . Hyperlipidemia Resolved 2014 after weight loss  . Hypertension     Past Surgical History:  Procedure Laterality Date  . LAPAROSCOPIC GASTRIC SLEEVE RESECTION       Outpatient Medications Prior to Visit  Medication Sig Dispense Refill  . aspirin EC 81 MG tablet Take 81 mg by mouth daily.    . Multiple Vitamins-Minerals (MULTIVITAMIN WITH MINERALS) tablet Take 1 tablet by mouth daily.    . chlorthalidone (HYGROTON) 25 MG tablet Take 25 mg by mouth daily.    Marland Kitchen spironolactone (ALDACTONE) 25 MG tablet Take 25 mg by mouth daily.    Marland Kitchen amLODipine (NORVASC) 10 MG tablet Take 1 tablet (10 mg total) by mouth daily. (Patient not taking: Reported on 11/03/2016) 30 tablet 1  . lisinopril (PRINIVIL,ZESTRIL) 5 MG tablet Take 1 tablet (5 mg total) by mouth daily. (Patient not taking: Reported on 11/03/2016) 30 tablet 1   No facility-administered medications prior to visit.     No Known Allergies   Family History  Problem Relation Age of Onset  . Diabetes Mother   . Hypertension Mother   .  Diabetes Father   . Hypertension Father      Health Habits: Dental Exam: up to date Eye Exam: up to date Exercise: 0  times/week on average Current exercise activities: walking/running  Social History   Social History  . Marital status: Married    Spouse name: N/A  . Number of children: N/A  . Years of education: N/A   Occupational History  . Not on file.   Social History Main Topics  . Smoking status: Never Smoker  . Smokeless tobacco: Never Used  . Alcohol use No  . Drug use: No  . Sexual activity: Not on file   Other Topics Concern  . Not on file   Social History Narrative  . No narrative on file   History  Alcohol Use No   History  Smoking Status  . Never Smoker  Smokeless Tobacco  . Never Used   History  Drug Use No     Health Maintenance: See under health Maintenance activity for review of completion dates as well.  There is no immunization history on file for this patient.    Depression Screen-PHQ2/9 Depression screen [facility] 2/9 11/03/2016  Decreased Interest 0  Down, Depressed, Hopeless 0  PHQ - 2 Score 0      Depression Severity and Treatment Recommendations:  0-4= None  5-9= Mild / Treatment: Support, educate to call if worse; return in one month  10-14= Moderate /  Treatment: Support, watchful waiting; Antidepressant or Psycotherapy  15-19= Moderately severe / Treatment: Antidepressant OR Psychotherapy  >= 20 = Major depression, severe / Antidepressant AND Psychotherapy    Review of Systems   Review of Systems  Constitutional: Positive for weight loss. Negative for chills and fever.       Weight loss through bariatic surgery  HENT: Negative for ear discharge, ear pain, hearing loss and tinnitus.   Eyes: Negative for blurred vision and double vision.  Respiratory: Negative for cough, hemoptysis, sputum production and shortness of breath.   Cardiovascular: Negative for chest pain and palpitations.  Gastrointestinal: Negative for  abdominal pain, nausea and vomiting.  Genitourinary: Negative for dysuria and urgency.  Musculoskeletal: Positive for joint pain.       Multiple joint pain due to arthritis  Skin: Negative for itching and rash.  Neurological: Negative for dizziness, tingling, tremors and headaches.  Psychiatric/Behavioral: Negative for hallucinations and substance abuse. The patient is not nervous/anxious.     See HPI for ROS as well.    Objective:   Vitals:   11/03/16 1618  BP: (!) 154/92  Pulse: 98  Resp: 18  Temp: 98.6 F (37 C)  TempSrc: Oral  SpO2: 99%  Weight: 258 lb (117 kg)  Height: 6\' 7"  (2.007 m)    Body mass index is 29.06 kg/m.  Physical Exam  Constitutional: He is oriented to person, place, and time. He appears well-developed and well-nourished.  HENT:  Head: Normocephalic and atraumatic.  Right Ear: External ear normal.  Left Ear: External ear normal.  Mouth/Throat: Oropharynx is clear and moist.  Eyes: Conjunctivae and EOM are normal. Pupils are equal, round, and reactive to light.  Neck: Normal range of motion. Neck supple. No thyromegaly present.  Cardiovascular: Normal rate, regular rhythm and normal heart sounds.   No murmur heard. Pulmonary/Chest: Effort normal and breath sounds normal. No respiratory distress. He has no wheezes.  Abdominal: Soft. Bowel sounds are normal. He exhibits no distension. There is no tenderness.  Musculoskeletal:  OA changes of knees Ambulates with walker  Neurological: He is alert and oriented to person, place, and time. He has normal reflexes. No cranial nerve deficit.  Skin: Skin is warm and dry. No erythema.  Psychiatric: He has a normal mood and affect. His behavior is normal. Judgment and thought content normal.    ECG- sinus rhythm, no twi, no st segment changes   Assessment/Plan:   Patient was seen for a health maintenance exam.  Counseled the patient on health maintenance issues. Reviewed her health mainteance schedule and  ordered appropriate tests (see orders.) Counseled on regular exercise and weight management. Recommend regular eye exams and dental cleaning.   The following issues were addressed today for health maintenance:   Mack[place] was seen today for establish care.  Diagnoses and all orders for this visit:  Pre-operative clearance- cleared for surgery, normal CBC, normal lytes, ECG without signs of ischemia.  Cleared for surgery. -     EKG 12-Lead -     Comprehensive metabolic panel -     CBC with Differential/Platelet -     Cancel: POCT urinalysis dipstick -     TSH  Essential hypertension- bp improved compared to previous Continue medication compliance ECG reviewed electronically by Cardiology who does not recommend any additional clearance -     EKG 12-Lead -     Comprehensive metabolic panel -     CBC with Differential/Platelet -     Cancel: POCT urinalysis  dipstick  Screening for prostate cancer -     PSA  Health maintenance examination- reviewed age appropriate screenings Discussed psa screening Lipid screening Vaccinations reviewed    No Follow-up on file.    Body mass index is 29.06 kg/m.:  Discussed the patient's BMI with patient. The BMI body mass index is 29.06 kg/m.     Future Appointments Date Time Provider Rankin  11/25/2016 10:15 AM [facility] PAT 2 [facility] None    Patient Instructions       IF you received an x-ray today, you will receive an invoice from [facility]. Please contact [facility] at [phone] with questions or concerns regarding your invoice.   IF you received labwork today, you will receive an invoice from [employer]. Please contact [facility] at [phone] with questions or concerns regarding your invoice.   Our billing staff will not be able to assist you with questions regarding bills from these companies.  You will be contacted with the lab results as soon as they are available. The  fastest way to get your results is to activate your My Chart account. Instructions are located on the last page of this paperwork. If you have not heard from [place] regarding the results in 2 weeks, please contact this office.     \

## 2016-11-04 LAB — PSA: PROSTATE SPECIFIC AG, SERUM: 0.6 ng/mL (ref 0.0–4.0)

## 2016-11-05 LAB — COMPREHENSIVE METABOLIC PANEL
ALT: 23 IU/L (ref 0–44)
AST: 21 IU/L (ref 0–40)
Albumin/Globulin Ratio: 1.5 (ref 1.2–2.2)
Albumin: 4.4 g/dL (ref 3.5–5.5)
Alkaline Phosphatase: 70 IU/L (ref 39–117)
BILIRUBIN TOTAL: 1.1 mg/dL (ref 0.0–1.2)
BUN/Creatinine Ratio: 14 (ref 9–20)
BUN: 14 mg/dL (ref 6–24)
CALCIUM: 10.1 mg/dL (ref 8.7–10.2)
CHLORIDE: 95 mmol/L — AB (ref 96–106)
CO2: 30 mmol/L — ABNORMAL HIGH (ref 18–29)
Creatinine, Ser: 1.03 mg/dL (ref 0.76–1.27)
GFR calc non Af Amer: 81 mL/min/{1.73_m2} (ref >59)
GFR, EST AFRICAN AMERICAN: 93 mL/min/{1.73_m2} (ref >59)
GLUCOSE: 87 mg/dL (ref 65–99)
Globulin, Total: 2.9 g/dL (ref 1.5–4.5)
Potassium: 3.6 mmol/L (ref 3.5–5.2)
Sodium: 138 mmol/L (ref 134–144)
Total Protein: 7.3 g/dL (ref 6.0–8.5)

## 2016-11-05 LAB — CBC WITH DIFFERENTIAL/PLATELET
BASOS ABS: 0 10*3/uL (ref 0.0–0.2)
Basos: 0 %
EOS (ABSOLUTE): 0.2 10*3/uL (ref 0.0–0.4)
Eos: 3 %
HEMOGLOBIN: 14 g/dL (ref 13.0–17.7)
Hematocrit: 41.4 % (ref 37.5–51.0)
IMMATURE GRANS (ABS): 0 10*3/uL (ref 0.0–0.1)
IMMATURE GRANULOCYTES: 0 %
LYMPHS: 27 %
Lymphocytes Absolute: 1.6 10*3/uL (ref 0.7–3.1)
MCH: 27.9 pg (ref 26.6–33.0)
MCHC: 33.8 g/dL (ref 31.5–35.7)
MCV: 83 fL (ref 79–97)
Monocytes Absolute: 0.7 10*3/uL (ref 0.1–0.9)
Monocytes: 13 %
NEUTROS PCT: 57 %
Neutrophils Absolute: 3.3 10*3/uL (ref 1.4–7.0)
PLATELETS: 188 10*3/uL (ref 150–379)
RBC: 5.02 x10E6/uL (ref 4.14–5.80)
RDW: 14.4 % (ref 12.3–15.4)
WBC: 5.7 10*3/uL (ref 3.4–10.8)

## 2016-11-05 LAB — TSH: TSH: 0.535 u[IU]/mL (ref 0.450–4.500)

## 2016-11-22 DIAGNOSIS — G709 Myoneural disorder, unspecified: Secondary | ICD-10-CM

## 2016-11-22 HISTORY — PX: SPINE SURGERY: SHX786

## 2016-11-22 HISTORY — DX: Myoneural disorder, unspecified: G70.9

## 2016-11-25 ENCOUNTER — Encounter (HOSPITAL_COMMUNITY): Payer: Self-pay

## 2016-11-25 ENCOUNTER — Encounter (HOSPITAL_COMMUNITY)
Admission: RE | Admit: 2016-11-25 | Discharge: 2016-11-25 | Disposition: A | Discharge status: Home / Self Care | Payer: Managed Care, Other (non HMO) | Source: Ambulatory Visit | Attending: Orthopedic Surgery | Admitting: Orthopedic Surgery

## 2016-11-25 DIAGNOSIS — Z01812 Encounter for preprocedural laboratory examination: Secondary | ICD-10-CM | POA: Diagnosis present

## 2016-11-25 DIAGNOSIS — E119 Type 2 diabetes mellitus without complications: Secondary | ICD-10-CM | POA: Diagnosis not present

## 2016-11-25 HISTORY — DX: Unspecified osteoarthritis, unspecified site: M19.90

## 2016-11-25 HISTORY — DX: Abnormal weight loss: R63.4

## 2016-11-25 LAB — CBC
HCT: 41.7 % (ref 39.0–52.0)
Hemoglobin: 13.9 g/dL (ref 13.0–17.0)
MCH: 28.3 pg (ref 26.0–34.0)
MCHC: 33.3 g/dL (ref 30.0–36.0)
MCV: 84.8 fL (ref 78.0–100.0)
PLATELETS: 158 10*3/uL (ref 150–400)
RBC: 4.92 MIL/uL (ref 4.22–5.81)
RDW: 14.6 % (ref 11.5–15.5)
WBC: 4.8 10*3/uL (ref 4.0–10.5)

## 2016-11-25 LAB — BASIC METABOLIC PANEL
Anion gap: 8 (ref 5–15)
BUN: 12 mg/dL (ref 6–20)
CALCIUM: 9.4 mg/dL (ref 8.9–10.3)
CO2: 28 mmol/L (ref 22–32)
Chloride: 102 mmol/L (ref 101–111)
Creatinine, Ser: 1.07 mg/dL (ref 0.61–1.24)
GFR calc Af Amer: >60 mL/min (ref >60)
GLUCOSE: 90 mg/dL (ref 65–99)
Potassium: 3.3 mmol/L — ABNORMAL LOW (ref 3.5–5.1)
SODIUM: 138 mmol/L (ref 135–145)

## 2016-11-25 LAB — SURGICAL PCR SCREEN
MRSA, PCR: NEGATIVE
STAPHYLOCOCCUS AUREUS: NEGATIVE

## 2016-11-25 NOTE — Pre-Procedure Instructions (Addendum)
JAHAIRE HARWELL  11/25/2016      [id number], [place] [address] Phone: [phone] Fax: [phone]    Your procedure is scheduled on Wednesday January 10.  Report to [facility] Admitting at 6:30 A.M.  Call this number if you have problems the morning of surgery:  [phone]   Remember:  Do not eat food or drink liquids after midnight.  Take these medicines the morning of surgery with A SIP OF WATER: pregabalin (lyrica), tramadol (ultram) if needed  7 days prior to surgery STOP taking any Aspirin, Aleve, Naproxen, Ibuprofen, Motrin, Advil, Goody's, BC's, all herbal medications, fish oil, and all vitamins   Do not wear jewelry, make-up or nail polish.  Do not wear lotions, powders, or perfumes, or deoderant.  Do not shave 48 hours prior to surgery.  Men may shave face and neck.  Do not bring valuables to the hospital.  [facility] is not responsible for any belongings or valuables.  Contacts, dentures or bridgework may not be worn into surgery.  Leave your suitcase in the car.  After surgery it may be brought to your room.  For patients admitted to the hospital, discharge time will be determined by your treatment team.  Patients discharged the day of surgery will not be allowed to drive home.    Special instructions:    [facility]- Preparing For Surgery  Before surgery, you can play an important role. Because skin is not sterile, your skin needs to be as free of germs as possible. You can reduce the number of germs on your skin by washing with CHG (chlorahexidine gluconate) Soap before surgery.  CHG is an antiseptic cleaner which kills germs and bonds with the skin to continue killing germs even after washing.  Please do not use if you have an allergy to CHG or antibacterial soaps. If your skin becomes reddened/irritated stop using the CHG.  Do not shave (including legs and  underarms) for at least 48 hours prior to first CHG shower. It is OK to shave your face.  Please follow these instructions carefully.   1. Shower the NIGHT BEFORE SURGERY and the MORNING OF SURGERY with CHG.   2. If you chose to wash your hair, wash your hair first as usual with your normal shampoo.  3. After you shampoo, rinse your hair and body thoroughly to remove the shampoo.  4. Use CHG as you would any other liquid soap. You can apply CHG directly to the skin and wash gently with a scrungie or a clean washcloth.   5. Apply the CHG Soap to your body ONLY FROM THE NECK DOWN.  Do not use on open wounds or open sores. Avoid contact with your eyes, ears, mouth and genitals (private parts). Wash genitals (private parts) with your normal soap.  6. Wash thoroughly, paying special attention to the area where your surgery will be performed.  7. Thoroughly rinse your body with warm water from the neck down.  8. DO NOT shower/wash with your normal soap after using and rinsing off the CHG Soap.  9. Pat yourself dry with a CLEAN TOWEL.   10. Wear CLEAN PAJAMAS   11. Place CLEAN SHEETS on your bed the night of your first shower and DO NOT SLEEP WITH PETS.    Day of Surgery: Do not apply any deodorants/lotions. Please wear clean clothes to the hospital/surgery center.  Please read over the following fact sheets that you were given. MRSA Information

## 2016-11-25 NOTE — Progress Notes (Addendum)
PCP - Delia Chimes Cardiologist - [place]  Chest x-ray - not needed EKG - 11/03/16 Stress Test - requesting from dr Einar Gip ECHO - denies Cardiac Cath - denies  Patient has already stopped aspirin two weeks ago  Sending to anesthesia for review a the request of Dr. Rolena Infante  Patient denies shortness of breath, fever, cough and chest pain at PAT appointment

## 2016-11-26 LAB — HEMOGLOBIN A1C
HEMOGLOBIN A1C: 5.3 % (ref 4.8–5.6)
MEAN PLASMA GLUCOSE: 105 mg/dL

## 2016-11-26 NOTE — Progress Notes (Signed)
Anesthesia Chart Review:  Pt is a 56 year old male scheduled for L2-5 lumbar laminectomy/ decompression microdiscectomy on 12/01/2016 with Melina Schools, MD  PMH includes:  HTN, DM , hyperlipidemia. Never smoker. BMI 30.5  PMH includes:  ASA, inspra, olmesartan-hctz  Preoperative labs reviewed.  HgbA1c 5.3, glucose 90  EKG 11/03/16: sinus bradycardia (56 bpm)  Nuclear stress test 07/26/14 [facility] cardiovascular): LV end-diastolic volume moderately increased it to 17 mL. The perfusion imaging study demonstrates prominent diaphragmatic attenuation artifact without evidence of ischemia, scarring in this region cannot be excluded. Mild global hypokinesis, EF 44%. This represents an intermediate risk study at most. Finding may suggest nonischemic cardiomyopathy.  Echo 07/16/14:  - Left ventricle: The cavity size was normal. There was mildconcentric hypertrophy. Systolic function was normal. Theestimated ejection fraction was in the range of 55% to 60%. Wallmotion was normal; there were no regional wall motionabnormalities. Left ventricular diastolic function parameterswere normal. - Left atrium: The atrium was mildly dilated. The LA dimension is 5cm but Volume/BSA ratio is low (please see patient height) - Recommendations: Consider transesophageal echocardiography ifclinically indicated in order to exclude intracardiac thrombus.  Carotid duplex 07/16/14: 1-39 percent stenosis B ICA  Saw Delia Chimes, MD with internal medicine for pre-op eval and was cleared for surgery.   If no changes, I anticipate pt can proceed with surgery as scheduled.   Willeen Cass, FNP-BC [facility] Short Stay Surgical Center/Anesthesiology Phone: [phone] 11/26/2016 3:33 PM

## 2016-11-30 MED ORDER — DEXTROSE 5 % IV SOLN
3.0000 g | INTRAVENOUS | Status: AC
  Administered 2016-12-01: 3 g via INTRAVENOUS
  Administered 2016-12-01: 2 g via INTRAVENOUS
  Filled 2016-11-30: qty 3000

## 2016-11-30 NOTE — Anesthesia Preprocedure Evaluation (Signed)
Anesthesia Evaluation  Patient identified by MRN, date of birth, ID band Patient awake    Reviewed: Allergy & Precautions, H&P , Patient's Chart, lab work & pertinent test results, reviewed documented beta blocker date and time   Airway Mallampati: II  TM Distance: >3 FB Neck ROM: full    Dental no notable dental hx.    Pulmonary    Pulmonary exam normal breath sounds clear to auscultation       Cardiovascular hypertension,  Rhythm:regular Rate:Normal     Neuro/Psych    GI/Hepatic   Endo/Other  diabetes  Renal/GU      Musculoskeletal   Abdominal   Peds  Hematology   Anesthesia Other Findings   Reproductive/Obstetrics                             Anesthesia Physical Anesthesia Plan  ASA: III  Anesthesia Plan: General   Post-op Pain Management:    Induction: Intravenous  Airway Management Planned: Oral ETT  Additional Equipment:   Intra-op Plan:   Post-operative Plan: Extubation in OR  Informed Consent: I have reviewed the patients History and Physical, chart, labs and discussed the procedure including the risks, benefits and alternatives for the proposed anesthesia with the patient or authorized representative who has indicated his/her understanding and acceptance.   Dental Advisory Given and Dental advisory given  Plan Discussed with: CRNA and Surgeon  Anesthesia Plan Comments: (  Discussed general anesthesia, including possible nausea, instrumentation of airway, sore throat,pulmonary aspiration, etc. I asked if the were any outstanding questions, or  concerns before we proceeded.)        Anesthesia Quick Evaluation  

## 2016-12-01 ENCOUNTER — Inpatient Hospital Stay (HOSPITAL_COMMUNITY): Payer: Managed Care, Other (non HMO)

## 2016-12-01 ENCOUNTER — Observation Stay (HOSPITAL_COMMUNITY): Payer: Managed Care, Other (non HMO)

## 2016-12-01 ENCOUNTER — Inpatient Hospital Stay (HOSPITAL_COMMUNITY): Payer: Managed Care, Other (non HMO) | Admitting: Emergency Medicine

## 2016-12-01 ENCOUNTER — Encounter (HOSPITAL_COMMUNITY)
Admission: RE | Disposition: A | Discharge status: Rehabilitation Facility | Payer: Self-pay | Source: Ambulatory Visit | Attending: Orthopedic Surgery

## 2016-12-01 ENCOUNTER — Encounter (HOSPITAL_COMMUNITY): Payer: Self-pay | Admitting: Urology

## 2016-12-01 ENCOUNTER — Observation Stay (HOSPITAL_COMMUNITY)
Admission: RE | Admit: 2016-12-01 | Discharge: 2016-12-04 | Disposition: A | Discharge status: Rehabilitation Facility | Payer: Managed Care, Other (non HMO) | Source: Ambulatory Visit | Attending: Orthopedic Surgery | Admitting: Orthopedic Surgery

## 2016-12-01 ENCOUNTER — Inpatient Hospital Stay (HOSPITAL_COMMUNITY): Payer: Managed Care, Other (non HMO) | Admitting: Anesthesiology

## 2016-12-01 DIAGNOSIS — Z7982 Long term (current) use of aspirin: Secondary | ICD-10-CM | POA: Diagnosis not present

## 2016-12-01 DIAGNOSIS — I10 Essential (primary) hypertension: Secondary | ICD-10-CM

## 2016-12-01 DIAGNOSIS — M48062 Spinal stenosis, lumbar region with neurogenic claudication: Secondary | ICD-10-CM | POA: Diagnosis not present

## 2016-12-01 DIAGNOSIS — G9589 Other specified diseases of spinal cord: Secondary | ICD-10-CM | POA: Diagnosis not present

## 2016-12-01 DIAGNOSIS — K5903 Drug induced constipation: Secondary | ICD-10-CM

## 2016-12-01 DIAGNOSIS — R7303 Prediabetes: Secondary | ICD-10-CM

## 2016-12-01 DIAGNOSIS — M549 Dorsalgia, unspecified: Secondary | ICD-10-CM | POA: Diagnosis present

## 2016-12-01 DIAGNOSIS — M170 Bilateral primary osteoarthritis of knee: Secondary | ICD-10-CM | POA: Insufficient documentation

## 2016-12-01 DIAGNOSIS — G8918 Other acute postprocedural pain: Secondary | ICD-10-CM | POA: Diagnosis not present

## 2016-12-01 DIAGNOSIS — K5900 Constipation, unspecified: Secondary | ICD-10-CM | POA: Diagnosis not present

## 2016-12-01 DIAGNOSIS — G8220 Paraplegia, unspecified: Secondary | ICD-10-CM | POA: Diagnosis not present

## 2016-12-01 DIAGNOSIS — R29898 Other symptoms and signs involving the musculoskeletal system: Secondary | ICD-10-CM

## 2016-12-01 DIAGNOSIS — Z419 Encounter for procedure for purposes other than remedying health state, unspecified: Secondary | ICD-10-CM

## 2016-12-01 HISTORY — PX: LUMBAR LAMINECTOMY/DECOMPRESSION MICRODISCECTOMY: SHX5026

## 2016-12-01 HISTORY — PX: LUMBAR LAMINECTOMY: SHX95

## 2016-12-01 LAB — ABO/RH: ABO/RH(D): B POS

## 2016-12-01 LAB — TYPE AND SCREEN
ABO/RH(D): B POS
ANTIBODY SCREEN: NEGATIVE

## 2016-12-01 LAB — ECHO INTRAOPERATIVE TEE

## 2016-12-01 LAB — GLUCOSE, CAPILLARY: Glucose-Capillary: 78 mg/dL (ref 65–99)

## 2016-12-01 SURGERY — LUMBAR LAMINECTOMY/DECOMPRESSION MICRODISCECTOMY 3 LEVELS
Anesthesia: General | Site: Spine Lumbar

## 2016-12-01 MED ORDER — OXYCODONE-ACETAMINOPHEN 10-325 MG PO TABS: 1.0000 | ORAL_TABLET | ORAL | 0 refills | Status: DC | PRN

## 2016-12-01 MED ORDER — DEXAMETHASONE SODIUM PHOSPHATE 4 MG/ML IJ SOLN
4.0000 mg | Freq: Four times a day (QID) | INTRAMUSCULAR | Status: AC
  Administered 2016-12-01 – 2016-12-02 (×3): 4 mg via INTRAVENOUS
  Filled 2016-12-01 (×4): qty 1

## 2016-12-01 MED ORDER — GLYCOPYRROLATE 0.2 MG/ML IJ SOLN
INTRAMUSCULAR | Status: DC | PRN
  Administered 2016-12-01: 0.2 mg via INTRAVENOUS

## 2016-12-01 MED ORDER — LIDOCAINE HCL (CARDIAC) 20 MG/ML IV SOLN
INTRAVENOUS | Status: DC | PRN
  Administered 2016-12-01: 100 mg via INTRATRACHEAL

## 2016-12-01 MED ORDER — CEFAZOLIN SODIUM-DEXTROSE 2-4 GM/100ML-% IV SOLN
2.0000 g | Freq: Three times a day (TID) | INTRAVENOUS | Status: AC
  Administered 2016-12-01 – 2016-12-02 (×2): 2 g via INTRAVENOUS
  Filled 2016-12-01 (×2): qty 100

## 2016-12-01 MED ORDER — THROMBIN 20000 UNITS EX SOLR
CUTANEOUS | Status: AC
  Filled 2016-12-01: qty 20000

## 2016-12-01 MED ORDER — FENTANYL CITRATE (PF) 100 MCG/2ML IJ SOLN
INTRAMUSCULAR | Status: DC | PRN
Start: 2016-12-01 — End: 2016-12-01
  Administered 2016-12-01 (×2): 50 ug via INTRAVENOUS
  Administered 2016-12-01: 25 ug via INTRAVENOUS
  Administered 2016-12-01: 175 ug via INTRAVENOUS

## 2016-12-01 MED ORDER — ROCURONIUM BROMIDE 100 MG/10ML IV SOLN
INTRAVENOUS | Status: DC | PRN
  Administered 2016-12-01 (×3): 50 mg via INTRAVENOUS

## 2016-12-01 MED ORDER — OXYCODONE-ACETAMINOPHEN 5-325 MG PO TABS: 1.0000 | ORAL_TABLET | ORAL | Status: DC | PRN

## 2016-12-01 MED ORDER — ROCURONIUM BROMIDE 50 MG/5ML IV SOSY
PREFILLED_SYRINGE | INTRAVENOUS | Status: AC
  Filled 2016-12-01: qty 10

## 2016-12-01 MED ORDER — SUGAMMADEX SODIUM 200 MG/2ML IV SOLN
INTRAVENOUS | Status: DC | PRN
  Administered 2016-12-01: 200 mg via INTRAVENOUS

## 2016-12-01 MED ORDER — ONDANSETRON HCL 4 MG/2ML IJ SOLN
INTRAMUSCULAR | Status: AC
  Filled 2016-12-01: qty 2

## 2016-12-01 MED ORDER — LACTATED RINGERS IV SOLN
INTRAVENOUS | Status: DC | PRN
  Administered 2016-12-01 (×3): via INTRAVENOUS

## 2016-12-01 MED ORDER — ACETAMINOPHEN 325 MG PO TABS: 650.0000 mg | ORAL_TABLET | ORAL | Status: DC | PRN

## 2016-12-01 MED ORDER — DEXAMETHASONE 4 MG PO TABS
4.0000 mg | ORAL_TABLET | Freq: Four times a day (QID) | ORAL | Status: AC
  Filled 2016-12-01: qty 1

## 2016-12-01 MED ORDER — METHOCARBAMOL 1000 MG/10ML IJ SOLN
500.0000 mg | Freq: Four times a day (QID) | INTRAVENOUS | Status: DC | PRN
  Filled 2016-12-01: qty 5

## 2016-12-01 MED ORDER — METOPROLOL TARTARATE 1 MG/ML SYRINGE (5ML)
Status: DC | PRN
Start: 2016-12-01 — End: 2016-12-01
  Administered 2016-12-01 (×2): 2 mg via INTRAVENOUS
  Administered 2016-12-01: 1 mg via INTRAVENOUS

## 2016-12-01 MED ORDER — FENTANYL CITRATE (PF) 100 MCG/2ML IJ SOLN
INTRAMUSCULAR | Status: AC
  Filled 2016-12-01: qty 2

## 2016-12-01 MED ORDER — ONDANSETRON HCL 4 MG/2ML IJ SOLN
INTRAMUSCULAR | Status: DC | PRN
  Administered 2016-12-01 (×2): 4 mg via INTRAVENOUS

## 2016-12-01 MED ORDER — MORPHINE SULFATE (PF) 2 MG/ML IV SOLN: 2.0000 mg | INTRAVENOUS | Status: DC | PRN

## 2016-12-01 MED ORDER — FENTANYL CITRATE (PF) 100 MCG/2ML IJ SOLN
50.0000 ug | INTRAMUSCULAR | Status: DC | PRN
  Administered 2016-12-01 (×2): 50 ug via INTRAVENOUS

## 2016-12-01 MED ORDER — LORAZEPAM 2 MG/ML IJ SOLN
INTRAMUSCULAR | Status: AC
  Filled 2016-12-01: qty 3

## 2016-12-01 MED ORDER — PROPOFOL 10 MG/ML IV BOLUS
INTRAVENOUS | Status: AC
  Filled 2016-12-01: qty 20

## 2016-12-01 MED ORDER — PHENOL 1.4 % MT LIQD: 1.0000 | OROMUCOSAL | Status: DC | PRN

## 2016-12-01 MED ORDER — POLYETHYLENE GLYCOL 3350 17 G PO PACK: 17.0000 g | PACK | Freq: Every day | ORAL | Status: DC | PRN

## 2016-12-01 MED ORDER — PROPOFOL 500 MG/50ML IV EMUL
INTRAVENOUS | Status: DC | PRN
  Administered 2016-12-01: 25 ug/kg/min via INTRAVENOUS

## 2016-12-01 MED ORDER — GELATIN ABSORBABLE 100 EX MISC
CUTANEOUS | Status: DC | PRN
  Administered 2016-12-01: 20 mL via TOPICAL

## 2016-12-01 MED ORDER — PROPOFOL 10 MG/ML IV BOLUS: INTRAVENOUS | Status: DC | PRN

## 2016-12-01 MED ORDER — MENTHOL 3 MG MT LOZG: 1.0000 | LOZENGE | OROMUCOSAL | Status: DC | PRN

## 2016-12-01 MED ORDER — LORAZEPAM 2 MG/ML IJ SOLN
2.0000 mg | Freq: Once | INTRAMUSCULAR | Status: AC
  Administered 2016-12-01: 2 mg via INTRAVENOUS
  Filled 2016-12-01: qty 1

## 2016-12-01 MED ORDER — ACETAMINOPHEN 10 MG/ML IV SOLN
INTRAVENOUS | Status: DC | PRN
  Administered 2016-12-01: 1000 mg via INTRAVENOUS

## 2016-12-01 MED ORDER — DIAZEPAM 5 MG/ML IJ SOLN: 5.0000 mg | Freq: Once | INTRAMUSCULAR | Status: DC

## 2016-12-01 MED ORDER — BUPIVACAINE-EPINEPHRINE 0.25% -1:200000 IJ SOLN
INTRAMUSCULAR | Status: DC | PRN
  Administered 2016-12-01: 10 mL

## 2016-12-01 MED ORDER — METHOCARBAMOL 500 MG PO TABS: 500.0000 mg | ORAL_TABLET | Freq: Three times a day (TID) | ORAL | 0 refills | Status: DC | PRN

## 2016-12-01 MED ORDER — ARTIFICIAL TEARS OP OINT
TOPICAL_OINTMENT | OPHTHALMIC | Status: DC | PRN
  Administered 2016-12-01: 1 via OPHTHALMIC

## 2016-12-01 MED ORDER — IRBESARTAN 300 MG PO TABS
300.0000 mg | ORAL_TABLET | Freq: Every day | ORAL | Status: DC
  Administered 2016-12-02 – 2016-12-04 (×3): 300 mg via ORAL
  Filled 2016-12-01 (×3): qty 1

## 2016-12-01 MED ORDER — SODIUM CHLORIDE 0.9% FLUSH: 3.0000 mL | INTRAVENOUS | Status: DC | PRN

## 2016-12-01 MED ORDER — METOPROLOL TARTRATE 5 MG/5ML IV SOLN
INTRAVENOUS | Status: AC
  Filled 2016-12-01: qty 5

## 2016-12-01 MED ORDER — PROPOFOL 10 MG/ML IV BOLUS
INTRAVENOUS | Status: DC | PRN
  Administered 2016-12-01: 200 mg via INTRAVENOUS

## 2016-12-01 MED ORDER — LACTATED RINGERS IV SOLN: INTRAVENOUS | Status: DC

## 2016-12-01 MED ORDER — SUCCINYLCHOLINE CHLORIDE 200 MG/10ML IV SOSY
PREFILLED_SYRINGE | INTRAVENOUS | Status: AC
  Filled 2016-12-01: qty 10

## 2016-12-01 MED ORDER — DEXAMETHASONE SODIUM PHOSPHATE 10 MG/ML IJ SOLN
INTRAMUSCULAR | Status: AC
  Filled 2016-12-01: qty 1

## 2016-12-01 MED ORDER — ACETAMINOPHEN 10 MG/ML IV SOLN
1000.0000 mg | Freq: Four times a day (QID) | INTRAVENOUS | Status: AC
  Administered 2016-12-01 – 2016-12-02 (×4): 1000 mg via INTRAVENOUS
  Filled 2016-12-01 (×5): qty 100

## 2016-12-01 MED ORDER — METHOCARBAMOL 500 MG PO TABS
500.0000 mg | ORAL_TABLET | Freq: Four times a day (QID) | ORAL | Status: DC | PRN
  Filled 2016-12-01: qty 1

## 2016-12-01 MED ORDER — MIDAZOLAM HCL 2 MG/2ML IJ SOLN
INTRAMUSCULAR | Status: AC
  Filled 2016-12-01: qty 2

## 2016-12-01 MED ORDER — ACETAMINOPHEN 650 MG RE SUPP: 650.0000 mg | RECTAL | Status: DC | PRN

## 2016-12-01 MED ORDER — SODIUM CHLORIDE 0.9% FLUSH
3.0000 mL | Freq: Two times a day (BID) | INTRAVENOUS | Status: DC
  Administered 2016-12-01 – 2016-12-04 (×6): 3 mL via INTRAVENOUS

## 2016-12-01 MED ORDER — OLMESARTAN-AMLODIPINE-HCTZ 40-5-25 MG PO TABS: 1.0000 | ORAL_TABLET | Freq: Every day | ORAL | Status: DC

## 2016-12-01 MED ORDER — AMLODIPINE BESYLATE 5 MG PO TABS
5.0000 mg | ORAL_TABLET | Freq: Every day | ORAL | Status: DC
  Administered 2016-12-02 – 2016-12-04 (×3): 5 mg via ORAL
  Filled 2016-12-01 (×3): qty 1

## 2016-12-01 MED ORDER — ONDANSETRON HCL 4 MG PO TABS: 4.0000 mg | ORAL_TABLET | Freq: Three times a day (TID) | ORAL | 0 refills | Status: DC | PRN

## 2016-12-01 MED ORDER — ROCURONIUM BROMIDE 50 MG/5ML IV SOSY
PREFILLED_SYRINGE | INTRAVENOUS | Status: AC
  Filled 2016-12-01: qty 5

## 2016-12-01 MED ORDER — EPINEPHRINE PF 1 MG/ML IJ SOLN
INTRAMUSCULAR | Status: AC
  Filled 2016-12-01: qty 1

## 2016-12-01 MED ORDER — ONDANSETRON HCL 4 MG/2ML IJ SOLN: 4.0000 mg | INTRAMUSCULAR | Status: DC | PRN

## 2016-12-01 MED ORDER — HEMOSTATIC AGENTS (NO CHARGE) OPTIME
TOPICAL | Status: DC | PRN
  Administered 2016-12-01 (×2): 1 via TOPICAL

## 2016-12-01 MED ORDER — DIAZEPAM 5 MG/ML IJ SOLN
INTRAMUSCULAR | Status: AC
  Filled 2016-12-01: qty 2

## 2016-12-01 MED ORDER — ACETAMINOPHEN 10 MG/ML IV SOLN
INTRAVENOUS | Status: AC
  Filled 2016-12-01: qty 100

## 2016-12-01 MED ORDER — DEXAMETHASONE SODIUM PHOSPHATE 10 MG/ML IJ SOLN
INTRAMUSCULAR | Status: DC | PRN
  Administered 2016-12-01: 10 mg via INTRAVENOUS

## 2016-12-01 MED ORDER — SODIUM CHLORIDE 0.9 % IV SOLN: 250.0000 mL | INTRAVENOUS | Status: DC

## 2016-12-01 MED ORDER — PROPOFOL 1000 MG/100ML IV EMUL
INTRAVENOUS | Status: AC
  Filled 2016-12-01: qty 200

## 2016-12-01 MED ORDER — SUGAMMADEX SODIUM 200 MG/2ML IV SOLN
INTRAVENOUS | Status: AC
  Filled 2016-12-01: qty 2

## 2016-12-01 MED ORDER — HYDROCHLOROTHIAZIDE 25 MG PO TABS
25.0000 mg | ORAL_TABLET | Freq: Every day | ORAL | Status: DC
  Administered 2016-12-02 – 2016-12-03 (×2): 25 mg via ORAL
  Filled 2016-12-01 (×4): qty 1

## 2016-12-01 MED ORDER — SPIRONOLACTONE 25 MG PO TABS
25.0000 mg | ORAL_TABLET | Freq: Every day | ORAL | Status: DC
  Administered 2016-12-02 – 2016-12-04 (×3): 25 mg via ORAL
  Filled 2016-12-01 (×4): qty 1

## 2016-12-01 MED ORDER — LIDOCAINE 2% (20 MG/ML) 5 ML SYRINGE
INTRAMUSCULAR | Status: AC
  Filled 2016-12-01: qty 5

## 2016-12-01 MED ORDER — 0.9 % SODIUM CHLORIDE (POUR BTL) OPTIME
TOPICAL | Status: DC | PRN
  Administered 2016-12-01 (×2): 1000 mL

## 2016-12-01 MED ORDER — PHENYLEPHRINE HCL 10 MG/ML IJ SOLN
INTRAMUSCULAR | Status: DC | PRN
  Administered 2016-12-01: 50 ug/min via INTRAVENOUS

## 2016-12-01 MED ORDER — BUPIVACAINE HCL (PF) 0.25 % IJ SOLN
INTRAMUSCULAR | Status: AC
  Filled 2016-12-01: qty 30

## 2016-12-01 MED ORDER — ARTIFICIAL TEARS OP OINT
TOPICAL_OINTMENT | OPHTHALMIC | Status: AC
  Filled 2016-12-01: qty 3.5

## 2016-12-01 MED ORDER — MIDAZOLAM HCL 2 MG/2ML IJ SOLN
INTRAMUSCULAR | Status: DC | PRN
  Administered 2016-12-01 (×4): 1 mg via INTRAVENOUS

## 2016-12-01 MED ORDER — FENTANYL CITRATE (PF) 100 MCG/2ML IJ SOLN: 25.0000 ug | INTRAMUSCULAR | Status: DC | PRN

## 2016-12-01 MED ORDER — NAPHAZOLINE-GLYCERIN 0.012-0.2 % OP SOLN
1.0000 [drp] | Freq: Two times a day (BID) | OPHTHALMIC | Status: DC | PRN
  Filled 2016-12-01: qty 15

## 2016-12-01 SURGICAL SUPPLY — 68 items
ADH SKN CLS APL DERMABOND .7 (GAUZE/BANDAGES/DRESSINGS)
BUR EGG ELITE 4.0 (BURR) IMPLANT
BUR MATCHSTICK NEURO 3.0 LAGG (BURR) ×1 IMPLANT
CANISTER SUCTION 2500CC (MISCELLANEOUS) ×2 IMPLANT
CLSR STERI-STRIP ANTIMIC 1/2X4 (GAUZE/BANDAGES/DRESSINGS) ×1 IMPLANT
CORDS BIPOLAR (ELECTRODE) ×2 IMPLANT
COVER SURGICAL LIGHT HANDLE (MISCELLANEOUS) ×2 IMPLANT
DERMABOND ADVANCED (GAUZE/BANDAGES/DRESSINGS)
DERMABOND ADVANCED .7 DNX12 (GAUZE/BANDAGES/DRESSINGS) ×1 IMPLANT
DRAIN CHANNEL 15F RND FF W/TCR (WOUND CARE) ×2 IMPLANT
DRAPE POUCH INSTRU U-SHP 10X18 (DRAPES) ×2 IMPLANT
DRAPE SURG 17X23 STRL (DRAPES) ×2 IMPLANT
DRAPE U-SHAPE 47X51 STRL (DRAPES) ×2 IMPLANT
DRSG AQUACEL AG ADV 3.5X10 (GAUZE/BANDAGES/DRESSINGS) ×2 IMPLANT
DURAPREP 26ML APPLICATOR (WOUND CARE) ×2 IMPLANT
ELECT BLADE 4.0 EZ CLEAN MEGAD (MISCELLANEOUS) ×2
ELECT CAUTERY BLADE 6.4 (BLADE) ×2 IMPLANT
ELECT PENCIL ROCKER SW 15FT (MISCELLANEOUS) ×2 IMPLANT
ELECT REM PT RETURN 9FT ADLT (ELECTROSURGICAL) ×2
ELECTRODE BLDE 4.0 EZ CLN MEGD (MISCELLANEOUS) IMPLANT
ELECTRODE REM PT RTRN 9FT ADLT (ELECTROSURGICAL) ×1 IMPLANT
EVACUATOR 1/8 PVC DRAIN (DRAIN) IMPLANT
EVACUATOR SILICONE 100CC (DRAIN) ×2 IMPLANT
FILTER STRAW FLUID ASPIR (MISCELLANEOUS) ×1 IMPLANT
GAUZE SPONGE 4X4 12PLY STRL (GAUZE/BANDAGES/DRESSINGS) ×1 IMPLANT
GLOVE BIO SURGEON STRL SZ 6.5 (GLOVE) ×2 IMPLANT
GLOVE BIOGEL PI IND STRL 6.5 (GLOVE) ×1 IMPLANT
GLOVE BIOGEL PI IND STRL 8.5 (GLOVE) ×1 IMPLANT
GLOVE BIOGEL PI INDICATOR 6.5 (GLOVE) ×1
GLOVE BIOGEL PI INDICATOR 8.5 (GLOVE) ×1
GLOVE SS BIOGEL STRL SZ 8.5 (GLOVE) ×1 IMPLANT
GLOVE SUPERSENSE BIOGEL SZ 8.5 (GLOVE) ×1
GOWN STRL REUS W/ TWL LRG LVL3 (GOWN DISPOSABLE) ×1 IMPLANT
GOWN STRL REUS W/TWL 2XL LVL3 (GOWN DISPOSABLE) ×4 IMPLANT
GOWN STRL REUS W/TWL LRG LVL3 (GOWN DISPOSABLE) ×2
KIT BASIN OR (CUSTOM PROCEDURE TRAY) ×2 IMPLANT
KIT ROOM TURNOVER OR (KITS) ×2 IMPLANT
NDL SPNL 18GX3.5 QUINCKE PK (NEEDLE) ×2 IMPLANT
NEEDLE 22X1 1/2 (OR ONLY) (NEEDLE) ×2 IMPLANT
NEEDLE SPNL 18GX3.5 QUINCKE PK (NEEDLE) ×4 IMPLANT
NS IRRIG 1000ML POUR BTL (IV SOLUTION) ×2 IMPLANT
PACK LAMINECTOMY ORTHO (CUSTOM PROCEDURE TRAY) ×2 IMPLANT
PACK UNIVERSAL I (CUSTOM PROCEDURE TRAY) ×2 IMPLANT
PAD ARMBOARD 7.5X6 YLW CONV (MISCELLANEOUS) ×6 IMPLANT
PATTIES SURGICAL .5 X.5 (GAUZE/BANDAGES/DRESSINGS) IMPLANT
PATTIES SURGICAL .5 X1 (DISPOSABLE) ×3 IMPLANT
SPONGE LAP 4X18 X RAY DECT (DISPOSABLE) ×6 IMPLANT
SPONGE SURGIFOAM ABS GEL 100 (HEMOSTASIS) ×1 IMPLANT
STRIP CLOSURE SKIN 1/2X4 (GAUZE/BANDAGES/DRESSINGS) ×1 IMPLANT
SURGIFLO W/THROMBIN 8M KIT (HEMOSTASIS) ×2 IMPLANT
SUT BONE WAX W31G (SUTURE) ×2 IMPLANT
SUT ETHILON 2 0 PSLX (SUTURE) ×1 IMPLANT
SUT MON AB 3-0 SH 27 (SUTURE) ×2
SUT MON AB 3-0 SH27 (SUTURE) ×1 IMPLANT
SUT STRATAFIX 1PDS 45CM VIOLET (SUTURE) ×1 IMPLANT
SUT VIC AB 0 CT1 27 (SUTURE) ×2
SUT VIC AB 0 CT1 27XBRD ANBCTR (SUTURE) ×1 IMPLANT
SUT VIC AB 1 CTX 18 (SUTURE) ×2 IMPLANT
SUT VIC AB 1 CTX 36 (SUTURE)
SUT VIC AB 1 CTX36XBRD ANBCTR (SUTURE) ×2 IMPLANT
SUT VIC AB 2-0 CT1 18 (SUTURE) ×3 IMPLANT
SYR BULB IRRIGATION 50ML (SYRINGE) ×2 IMPLANT
SYR CONTROL 10ML LL (SYRINGE) ×2 IMPLANT
SYR TB 1ML LUER SLIP (SYRINGE) ×1 IMPLANT
TOWEL OR 17X24 6PK STRL BLUE (TOWEL DISPOSABLE) ×2 IMPLANT
TOWEL OR 17X26 10 PK STRL BLUE (TOWEL DISPOSABLE) ×2 IMPLANT
WATER STERILE IRR 1000ML POUR (IV SOLUTION) ×1 IMPLANT
YANKAUER SUCT BULB TIP NO VENT (SUCTIONS) ×2 IMPLANT

## 2016-12-01 NOTE — Progress Notes (Signed)
Patient noted to have significant weakness in LE's (R>L) in PACU. After he was noted to be more awake and alert there was improvement in his clinical exam.   Noted to be unable to stand and support weight and so stat MRI ordered.  In the MRI suite I evaluated the patient. According to his wife his right LE weakness was significant prior to surgery.  So much so that they required EMS to bring patient to the hospital this AM.  Clinically he had 4/5 left LE strength (improved from pre-op) and sensation was grossly intact to light touch.  Right LE: grossly 3/5 strength and sensation to light touch was intact.  Reflexes were 1+ symmetrical  Negative clonus/babinski Back pain noted  MRI:  Edema noted in the distal thoracic cord but no frank external compression.  Surgical site no complication noted.  Spoke with Dr Jeralyn Ruths (radiologist) about the scan.  May represent vascular insult.  Plan:  Patients overall clinically exam is slowly improving.  Given the rapid pre-op decline and inablitiy to stand and ambulate without assistance the patient may have had new acute insult which I did not appreciate pre-operatively. Since there is no external compression to the cord I do not think returning to the OR and extending the decompression would be advantageous. Will monitor clinical exam Continue IV decardon

## 2016-12-01 NOTE — Progress Notes (Signed)
Patient returned from [facility] at 72.

## 2016-12-01 NOTE — Brief Op Note (Signed)
12/01/2016  12:51 PM  PATIENT:  [name]  55 y.o. male  PRE-OPERATIVE DIAGNOSIS:  Lumbar Spinal Stenosis  POST-OPERATIVE DIAGNOSIS:  Lumbar Spinal Stenosis  PROCEDURE:  Procedure(s): Lumbar decompression L2-5 LUMBAR LAMINECTOMY/DECOMPRESSION MICRODISCECTOMY 3 LEVELS (N/A)  SURGEON:  Surgeon(s) and Role:    * Melina Schools, MD - Primary  PHYSICIAN ASSISTANT:   ASSISTANTS: Carmen Mayo   ANESTHESIA:   general  EBL:  Total I/O In: 2000 [I.V.:2000] Out: 2400 [Urine:2000; Blood:400]  BLOOD ADMINISTERED:none  DRAINS: 1 JP drain   LOCAL MEDICATIONS USED:  MARCAINE     SPECIMEN:  No Specimen  DISPOSITION OF SPECIMEN:  N/A  COUNTS:  YES  TOURNIQUET:  * No tourniquets in log *  DICTATION: .Other Dictation: Dictation Number [id number]  PLAN OF CARE: Admit to inpatient   PATIENT DISPOSITION:  PACU - hemodynamically stable.

## 2016-12-01 NOTE — Anesthesia Postprocedure Evaluation (Signed)
Anesthesia Post Note  Patient: [name]  Procedure(s) Performed: Procedure(s) (LRB): Lumbar decompression L2-5 LUMBAR LAMINECTOMY/DECOMPRESSION MICRODISCECTOMY 3 LEVELS (N/A)  Patient location during evaluation: PACU Anesthesia Type: General Level of consciousness: sedated Pain management: satisfactory to patient Vital Signs Assessment: post-procedure vital signs reviewed and stable Respiratory status: spontaneous breathing Cardiovascular status: stable Anesthetic complications: no       Last Vitals:  Vitals:   12/01/16 1445 12/01/16 1500  BP: (!) 158/105   Pulse: 64 79  Resp: 11 19  Temp:  36.7 C    Last Pain:  Vitals:   12/01/16 1500  TempSrc:   PainSc: 3                  Shaye Elling EDWARD

## 2016-12-01 NOTE — H&P (Signed)
History of Present Illness  The patient is a 56 year old male who comes in today for a preoperative History and Physical. The patient is scheduled for a L2-5 Lumbar Decompression to be performed by Dr. Duane Lope D. Rolena Infante, MD at [facility] on 12/01/16 . Please see the hospital record for complete dictated history and physical. The pt reports since he has lost weight he nolonger has Diabetes or Sleep apnea.   Problem List/Past Medical  Knee pain (M25.569)  Primary osteoarthritis of both knees (M17.0)  Chronic midline low back pain without sciatica (M54.5)  Spinal stenosis of lumbar region with neurogenic claudication [id number])  Problems Reconciled   Allergies  No Known Drug Allergies [09/21/2016]: Allergies Reconciled   Family History Diabetes Mellitus  Maternal Grandmother, Mother. Hypertension  Maternal Grandmother, Mother. Rheumatoid Arthritis  Maternal Grandmother, Mother.  Social History Tobacco / smoke exposure  09/18/2016: no Tobacco use  Never smoker. 09/18/2016 Children  2 Current work status  working full time Exercise  Exercises daily Living situation  live with spouse Marital status  married No history of drug/alcohol rehab  Not under pain contract  Number of flights of stairs before winded  greater than 5  Medication History  Lyrica (75MG  Capsule, 1 (one) Capsule Oral two times daily, as needed, Taken starting 11/08/2016) Active. Olmesartan-Amlodipine-HCTZ (40-10-25MG  Tablet, Oral) Active. (qd) Multi Vitamin Mens (Oral) Active. (qd) Eplerenone (50MG  Tablet, Oral) Active. (qd) Calcium 600 (1500 (600 Ca)MG Tablet, Oral) Active. (qd) Medications Reconciled  Vitals  11/26/2016 8:34 AM Weight: 262 lb Height: 77.5in Body Surface Area: 2.52 m Body Mass Index: 30.67 kg/m  Temp.: 98.63F  Pulse: 62 (Regular)  BP: 168/102 (Sitting, Left Arm, Standard)  General General Appearance-Not in acute  distress. Orientation-Oriented X3. Build & Nutrition-Well nourished and Well developed.  Integumentary General Characteristics Surgical Scars - no surgical scar evidence of previous lumbar surgery. Lumbar Spine-Skin examination of the lumbar spine is without deformity, skin lesions, lacerations or abrasions.  Chest and Lung Exam Auscultation Breath sounds - Normal and Clear.  Cardiovascular Auscultation Rhythm - Regular rate and rhythm.  Abdomen Palpation/Percussion Palpation and Percussion of the abdomen reveal - Soft, Non Tender and No Rebound tenderness.  Peripheral Vascular Lower Extremity Palpation - Posterior tibial pulse - Bilateral - 2+. Dorsalis pedis pulse - Bilateral - 2+.  Neurologic Sensation Lower Extremity - Bilateral - sensation is diminished in the lower extremity. Reflexes Patellar Reflex - Bilateral - 2+. Achilles Reflex - Bilateral - 2+. Clonus - Bilateral - clonus not present. Hoffman's Sign - Bilateral - Hoffman's sign not present. Testing Seated Straight Leg Raise - Bilateral - Seated straight leg raise negative.  Musculoskeletal Spine/Ribs/Pelvis  Lumbosacral Spine: Inspection and Palpation - Tenderness - no soft tissue tenderness to palpation, left lumbar paraspinals tender to palpation and right lumbar paraspinals tender to palpation. Strength and Tone: Strength - Hip Flexion - Left - 2/5. Right - 3/5. Knee Extension - Bilateral - 3/5. Knee Flexion - Bilateral - 3-/5. Ankle Dorsiflexion - Bilateral - 4-/5. Ankle Plantarflexion - Bilateral - 4-/5. Heel walk - Bilateral - unable to heel walk. Toe Walk - Bilateral - unable to walk on toes. ROM - Flexion - mildly decreased range of motion. Extension - moderately decreased range of motion and painful. Left Lateral Bending - moderately decreased range of motion and painful. Right Lateral Bending - moderately decreased range of motion and painful. Right Rotation - moderately decreased range of motion and  painful. Left Rotation - moderately decreased range of  motion and painful. Pain - neither flexion or extension is more painful than the other. Lumbosacral Spine - Waddell's Signs - no Waddell's signs present. Lower Extremity Range of Motion - No true hip, knee or ankle pain with range of motion. Gait and Baird - wheelchair.   His MRI from 10/18/2016 demonstrate severe central stenosis at L3-4, C4-5 moderate to severe at L2-3 with significant with severe bilateral lateral recess stenosis at L2-3. At L5-S1, there is collapse and degeneration of the disk, but mild to moderate stenosis.  Assessment & Plan   Posterior Lumbar Decompression/disectomy: Risks of surgery include infection, bleeding, nerve damage, death, stroke, paralysis, failure to heal, need for further surgery, ongoing or worse pain, need for further surgery, CSF leak, loss of bowel or bladder, and recurrent disc herniation or Stenosis which would necessitate need for further surgery.  Goal Of Surgery: Discussed that goal of surgery is to reduce pain and improve function and quality of life. Patient is aware that despite all appropriate treatment that there pain and function could be the same, worse, or different.  At this point in time, I do think his principal issue is the severe central stenosis at L2-3, L3-4, L4-5. The patient has classic neurogenic claudication secondary to his spinal stenosis. I have reviewed the imaging studies with the patient and his wife. We have gone over the surgery, which would be a lumbar decompression. We reviewed all the risks and benefits as well as the goals of surgery. All of his questions were addressed. I will put him on restricted duties at work. I am going to start him on some Lyrica for the neuropathic pain as well as an anti-inflammatory. We will also use some tramadol for breakthrough pain at night.

## 2016-12-01 NOTE — Progress Notes (Signed)
MRI notified of patient's stat mri per Dr. Rolena Infante. MRI to notify rn when scanner is available for patient.

## 2016-12-01 NOTE — Anesthesia Procedure Notes (Signed)
Procedure Name: Intubation Date/Time: 12/01/2016 8:49 AM Performed by: Lyndle Herrlich Pre-anesthesia Checklist: Patient identified, Emergency Drugs available, Suction available and Patient being monitored Patient Re-evaluated:Patient Re-evaluated prior to inductionOxygen Delivery Method: Circle system utilized Preoxygenation: Pre-oxygenation with 100% oxygen Intubation Type: IV induction Ventilation: Mask ventilation without difficulty Laryngoscope Size: Mac and 3 Grade View: Grade I Tube type: Oral Tube size: 7.5 mm Number of attempts: 1 Airway Equipment and Method: Stylet Placement Confirmation: ETT inserted through vocal cords under direct vision,  positive ETCO2 and breath sounds checked- equal and bilateral Secured at: 23 cm Tube secured with: Tape Dental Injury: Teeth and Oropharynx as per pre-operative assessment

## 2016-12-01 NOTE — Progress Notes (Signed)
Orthopedic Tech Progress Note Patient Details:  [name] 10-31-1961 [id number] Patient has brace. [name], male   DOB: 1961/06/12, 56 y.o.   MRN: [id number]   [name] 12/01/2016, 3:32 PM

## 2016-12-01 NOTE — OR Nursing (Signed)
NTs return from tx of patient and update to pt status upon arrival to [facility] and change to [facility] room. Dr. Rolena Infante updated by phone and expect MRI order.

## 2016-12-01 NOTE — Transfer of Care (Signed)
Immediate Anesthesia Transfer of Care Note  Patient: [name]  Procedure(s) Performed: Procedure(s): Lumbar decompression L2-5 LUMBAR LAMINECTOMY/DECOMPRESSION MICRODISCECTOMY 3 LEVELS (N/A)  Patient Location: PACU  Anesthesia Type:General  Level of Consciousness: awake, alert  and patient cooperative  Airway & Oxygen Therapy: Patient Spontanous Breathing and Patient connected to nasal cannula oxygen  Post-op Assessment: Report given to RN, Post -op Vital signs reviewed and stable, Patient moving all extremities X 4 and Patient able to stick tongue midline  Post vital signs: Reviewed and stable  Last Vitals:  Vitals:   12/01/16 1330 12/01/16 1345  BP: (!) 159/109   Pulse: 97 86  Resp: 18 18  Temp: 36.6 C     Last Pain:  Vitals:   12/01/16 0715  TempSrc: Oral         Complications: No apparent anesthesia complications

## 2016-12-02 ENCOUNTER — Encounter (HOSPITAL_COMMUNITY): Payer: Self-pay | Admitting: Orthopedic Surgery

## 2016-12-02 DIAGNOSIS — G8220 Paraplegia, unspecified: Secondary | ICD-10-CM | POA: Diagnosis not present

## 2016-12-02 DIAGNOSIS — M48062 Spinal stenosis, lumbar region with neurogenic claudication: Secondary | ICD-10-CM | POA: Diagnosis not present

## 2016-12-02 LAB — GLUCOSE, CAPILLARY: Glucose-Capillary: 115 mg/dL — ABNORMAL HIGH (ref 65–99)

## 2016-12-02 MED ORDER — CEFAZOLIN IN D5W 1 GM/50ML IV SOLN
1.0000 g | Freq: Three times a day (TID) | INTRAVENOUS | Status: DC
  Administered 2016-12-02 – 2016-12-03 (×3): 1 g via INTRAVENOUS
  Filled 2016-12-02 (×4): qty 50

## 2016-12-02 NOTE — Progress Notes (Signed)
Patients tray arrived to unit and delivered to patient.

## 2016-12-02 NOTE — Op Note (Signed)
[name], [name] NO.:  [id number]  MEDICAL RECORD NO.:  [id number]  LOCATION:                                 FACILITY:  PHYSICIAN:  Lexis Potenza D. Rolena Infante, M.D.      DATE OF BIRTH:  DATE OF PROCEDURE:  12/01/2016 DATE OF DISCHARGE:                              OPERATIVE REPORT   PREOPERATIVE DIAGNOSIS:  Severe lumbar spinal stenosis L2-L3, L3-L4, L4- L5.  POSTOPERATIVE DIAGNOSIS:  Severe lumbar spinal stenosis L2-L3, L3-L4, L4- L5.  OPERATIVE PROCEDURE:  Lumbar decompression L2 through L5.  COMPLICATIONS:  None.  FIRST ASSISTANT:  [facility]  HISTORY:  This is a very pleasant 56 year old gentleman, who has been having progressive back, buttock, and bilateral leg symptoms consistent with lumbar spinal stenosis with neurogenic claudication.  Attempts at conservative management have failed to alleviate his symptoms, and so we elected to proceed with surgery.  All appropriate risks, benefits, and alternatives were discussed with the patient.  Consent was obtained.  OPERATIVE NOTE:  The patient was brought to the operating room and placed supine on the operating table.  After successful induction of general anesthesia and endotracheal intubation; TEDs, SCDs, and a Foley were inserted.  He was turned prone onto the [place] frame.  All bony prominences were well padded, and back was prepped and draped in a standard fashion.  Time-out was taken confirming patient, procedure, and all other pertinent important data.  Two needles were placed in the back, and x-ray was taken to localize the skin incision.  The skin incision was marked and then infiltrated with 0.25% Marcaine with epinephrine.  Midline incision was made, and sharp dissection was carried out down to the deep fascia.  The fascia was sharply incised and using Cobb and Bovie, I stripped the paraspinal muscles to expose the spinous process of L2, L3, L4, and a portion of L5.  I then stripped out over  the facet capsule, taking great care not to violate the capsule itself.  Once I had the posterior aspect of the spine exposed, I then placed a Penfield 4 underneath the L4 lamina and confirmed that it was at the appropriate level.  Once I confirmed this, I removed the spinous process of L4 and L3 and the inferior portion of L2 and the inferior portion of L5.  At this point, I had marked out my levels for decompression.  Using a 3-0 Karlin curette, I developed a plane underneath the L4 lamina and used my 3 mm Kerrison to perform a generous laminotomy of L4.  I then undercut into the L5 the superior portion of the L5 lamina, completing the central decompression at L4-L5.  I then went into the lateral recess and removed the very thickened ligamentum flavum, bone spurs, and facet spurs, I was able to visualize the L5 pedicle.  Once I visualized the L5 pedicle, I identified the L5 nerve root as it was traversing into the foramen.  I performed a foraminotomy of L5.  I then continued my decompression superiorly, decompressing into the lateral recess at the L4-L5 level.  I then turned my attention centrally.  Using my 3 mm Kerrison punch,  I continued to complete my L4 laminectomy centrally.  I then continued superiorly, removing the ligamentum flavum between L3 and L4 and then using the same technique completed the central decompression using my 3 and 4 mm Kerrison punches.  I then continued up and resected the inferior two-thirds of the L2 lamina inferior third of the centrally region of L2.  Once I had the central decompression complete, I then worked into the right lateral recess.  I completed my decompression so that I could palpate and visualize the medial border of each pedicle of the levels.  Once I had the lateral recess decompression completed, I then performed foraminotomies using Kerrison punch.  At this point, once I had completed the right-sided decompression, I could easily pass my  [facility] elevator up to L2 pedicle out the L2 foramen, out the L3 foramen, L4 foramen, L5 foramen.  There was also no significant residual stenosis in the central region lateral recess foramen on that side.  Once I had completed the right-sided decompression, I then went to the contralateral side and then worked into the left lateral recess using the same technique.  Using my Kerrison punches, I resected the thickened calcified ligamentum flavum and the bone spurs.  Once I completed this, again I could easily pass my Surveyor, quantity from L2 down to L5.  I was able to visualize each pedicle.  The medial border of each pedicle confirming my satisfactory decompression and I could palpate out into the foramen.  An x-ray was taken to confirm that I expand the appropriate level and when compared to the preoperative MRI, I was satisfied with the decompression.  The wound was copiously irrigated with normal saline and then using bipolar electrocautery and FloSeal, I obtained hemostasis.  I did place a drain through a separate stab incision.  At this point, I then irrigated the wound with copious amount of normal saline and then closed the wound.  This was closed in a layered fashion with #1 interrupted Vicryl sutures and #1 barbed suture then a layer of 0 Vicryl, 2-0 Vicryl, and 3-0 Monocryl for the skin. Steri-Strips and dry dressing were applied, and the patient was ultimately taken to the PACU without incident.  At the end of the case, all needle and sponge counts were correct.  There were no adverse intraoperative events.     Grenda Lora D. Rolena Infante, M.D.     DDB/MEDQ  D:  12/01/2016  T:  12/02/2016  Job:  [id number]

## 2016-12-02 NOTE — Care Management Note (Signed)
Case Management Note  Patient Details  Name: [name] MRN: [id number] Date of Birth: 07-03-1961  Subjective/Objective:                  Patient underwent a Lumbar decompression L2-5 LUMBAR LAMINECTOMY/DECOMPRESSION MICRODISCECTOMY 3 LEVELS. Lives at home with spouse. CM will follow for discharge needs pending PT/OT evals and physician orders.   Action/Plan:   Expected Discharge Date:                  Expected Discharge Plan:     In-House Referral:     Discharge planning Services     Post Acute Care Choice:    Choice offered to:     DME Arranged:    DME Agency:     HH Arranged:    HH Agency:     Status of Service:     If discussed at [employer] of Stay Meetings, dates discussed:    Additional Comments:  Rolm Baptise, RN 12/02/2016, 12:06 PM

## 2016-12-02 NOTE — Progress Notes (Signed)
Rehab Admissions Coordinator Note:  Patient was screened by [name] for appropriateness for an Inpatient Acute Rehab Consult.  Noted PT recommending CIR.  At this time, we are recommending Inpatient Rehab consult.  [name] 12/02/2016, 2:03 PM  I can be reached at [phone].

## 2016-12-02 NOTE — Progress Notes (Addendum)
    Subjective: 1 Day Post-Op Procedure(s) (LRB): Lumbar decompression L2-5 LUMBAR LAMINECTOMY/DECOMPRESSION MICRODISCECTOMY 3 LEVELS (N/A) Patient reports pain as 0 on 0-10 scale.   Denies CP or SOB.  Foley just removed.  Pt has not voided yet. Positive flatus.Denies nausea.  Pt has not been ambulating yet. TED hose and compression devices in place Objective: Vital signs in last 24 hours: Temp:  [97.5 F (36.4 C)-98.9 F (37.2 C)] 98.8 F (37.1 C) (01/11 0500) Pulse Rate:  [54-97] 71 (01/11 0500) Resp:  [11-21] 18 (01/11 0500) BP: (127-185)/(50-109) 138/67 (01/11 0500) SpO2:  [94 %-100 %] 99 % (01/11 0500)  Intake/Output from previous day: 01/10 0701 - 01/11 0700 In: 3763 [P.O.:60; I.V.:3403; IV Piggyback:300] Out: [id number] [Urine:2800; Drains:275; Blood:400] Intake/Output this shift: No intake/output data recorded.  Labs: No results for input(s): HGB in the last 72 hours. No results for input(s): WBC, RBC, HCT, PLT in the last 72 hours. No results for input(s): NA, K, CL, CO2, BUN, CREATININE, GLUCOSE, CALCIUM in the last 72 hours. No results for input(s): LABPT, INR in the last 72 hours.  Physical Exam: Neurologically intact Sensation noted Dorsiflexion/plantar flexion Bandage an drain still in place Abdomen soft   Assessment/Plan: 1 Day Post-Op Procedure(s) (LRB): Lumbar decompression L2-5 LUMBAR LAMINECTOMY/DECOMPRESSION MICRODISCECTOMY 3 LEVELS (N/A) Advance diet Up with therapy  Social Work consult in place for Rehab post op Consider pull drain later today  Mayo, Darla Lesches for Dr. Melina Schools [facility] [phone] 12/02/2016, 7:04 AM  Patient markedly improved from yesterday Stood and walked short distance Agree with CIR Patient slowly improving with T11 cord edema, s/p L2-5 decompression with LE weakness R > L.

## 2016-12-02 NOTE — Evaluation (Signed)
Physical Therapy Evaluation Patient Details Name: [name] MRN: [id number] DOB: 28-Apr-1961 Today's Date: 12/02/2016   History of Present Illness  PAtient is a 56 y/o male admitted with severe central stenosis at L2-3, L3-4, L4-5 and neurogenic claudication, now s/p Lumbar decompression L2-5 LUMBAR LAMINECTOMY/DECOMPRESSION MICRODISCECTOMY 3 LEVELS  Clinical Impression  Patient presents with significant deficits in mobility requiring +2 A for transfers and unable to ambulate any distance due to R LE weakness.  Patient currently not safe to d/c home with second level bedroom and needing +2 A for transfers.  Feel he will need CIR level rehab to maximize safety, mobility and for caregiver education prior to d/c home.  PT to follow during acute stay.     Follow Up Recommendations CIR    Equipment Recommendations  Other (comment) (TBA)    Recommendations for Other Services       Precautions / Restrictions Precautions Precautions: Fall;Back Precaution Booklet Issued: Yes (comment) Required Braces or Orthoses: Spinal Brace Spinal Brace: Lumbar corset;Applied in sitting position      Mobility  Bed Mobility Overal bed mobility: Needs Assistance Bed Mobility: Rolling;Sidelying to Sit Rolling: Min assist Sidelying to sit: Mod assist       General bed mobility comments: assist for guiding legs off bed; support under trunk and increased time coming upright cues for technique  Transfers Overall transfer level: Needs assistance Equipment used: Rolling walker (2 wheeled) Transfers: Sit to/from Stand Sit to Stand: From elevated surface;Max assist;+2 physical assistance         General transfer comment: attempted with +1 A, but pt unable, assist with elevated surface of bed and increased time difficulty tranitioning hands to walker with R LE buckling  Ambulation/Gait Ambulation/Gait assistance: Mod assist;+2 safety/equipment Ambulation Distance (Feet): 2 Feet Assistive device:  Rolling walker (2 wheeled) Gait Pattern/deviations: Step-to pattern;Shuffle;Trunk flexed;Wide base of support;Decreased stride length     General Gait Details: 2-3 steps forward and back with RW, R LE buckling, difficulty progressing and heavy UE use on walker; did not pivot to sit in recliner due to low seat and pt 6'7" with long legs and would not be able to stand from low seat  Stairs            Wheelchair Mobility    Modified Rankin (Stroke Patients Only)       Balance Overall balance assessment: Needs assistance Sitting-balance support: Bilateral upper extremity supported Sitting balance-Leahy Scale: Poor Sitting balance - Comments: difficulty getting balanced at first reportedly, reliant on UE support at EOB, but only S level A   Standing balance support: Bilateral upper extremity supported Standing balance-Leahy Scale: Zero Standing balance comment: heavy UE reilance and mod support for safety                             Pertinent Vitals/Pain Pain Assessment: 0-10 Pain Score: 0-No pain (at rest, surgical pain with mobiltiy)    Home Living Family/patient expects to be discharged to:: Private residence Living Arrangements: Spouse/significant other;Children Available Help at Discharge: Family Type of Home: House Home Access: Stairs to enter Entrance Stairs-Rails: None Entrance Stairs-Number of Steps: 2 Home Layout: Bed/bath upstairs;Two level Home Equipment: Walker - 2 wheels;Shower seat      Prior Function Level of Independence: Needs assistance   Gait / Transfers Assistance Needed: was getting some help couple of days prior to surgery; last two times negotiated stairs bumping on his bottom  ADL's / Homemaking  Assistance Needed: was unable to get into shower just prior to surgery, was doing sponge baths  Comments: was using walker couple of days prior to surgery     Hand Dominance   Dominant Hand: Right    Extremity/Trunk Assessment    Upper Extremity Assessment Upper Extremity Assessment: Overall WFL for tasks assessed    Lower Extremity Assessment Lower Extremity Assessment: LLE deficits/detail;RLE deficits/detail RLE Deficits / Details: AAROM limited knee flexion with h/o arthritis, strength hip flexion 2/5, knee extension 3-/5, ankle DF 3-/5 RLE Sensation: decreased light touch (proprioception not specifically testing, but difficulty placing for tranfers) LLE Deficits / Details: AAROM limited knee flexion about 70 in supine (more in sitting), strength hip flexion 2+/5, knee extension 4/5, ankle DF 4-/5 LLE Sensation: decreased light touch       Communication   Communication: No difficulties  Cognition Arousal/Alertness: Awake/alert Behavior During Therapy: WFL for tasks assessed/performed Overall Cognitive Status: Within Functional Limits for tasks assessed                      General Comments General comments (skin integrity, edema, etc.): back brace very tight will need large corset    Exercises     Assessment/Plan    PT Assessment Patient needs continued PT services  PT Problem List Decreased strength;Decreased activity tolerance;Decreased balance;Decreased knowledge of use of DME;Pain;Impaired sensation;Decreased mobility;Decreased safety awareness          PT Treatment Interventions DME instruction;Therapeutic activities;Therapeutic exercise;Patient/family education;Balance training;Stair training;Functional mobility training;Gait training    PT Goals (Current goals can be found in the Care Plan section)  Acute Rehab PT Goals Patient Stated Goal: To get stronger, return to independent PT Goal Formulation: With patient Time For Goal Achievement: 12/16/16 Potential to Achieve Goals: Good    Frequency Min 5X/week   Barriers to discharge        Co-evaluation               End of Session Equipment Utilized During Treatment: Gait belt;Back brace Activity Tolerance: Patient  limited by fatigue;Patient limited by pain Patient left: in bed;with call bell/phone within reach Nurse Communication: Mobility status;Need for lift equipment    Functional Assessment Tool Used: Clinical Judgement Functional Limitation: Mobility: Walking and moving around Mobility: Walking and Moving Around Current Status [phone]): At least 60 percent but less than 80 percent impaired, limited or restricted Mobility: Walking and Moving Around Goal Status [phone]): At least 20 percent but less than 40 percent impaired, limited or restricted    Time: 1050-1134 PT Time Calculation (min) (ACUTE ONLY): 44 min   Charges:   PT Evaluation $PT Eval Moderate Complexity: 1 Procedure PT Treatments $Therapeutic Activity: 8-22 mins   PT G Codes:   PT G-Codes **NOT FOR INPATIENT CLASS** Functional Assessment Tool Used: Clinical Judgement Functional Limitation: Mobility: Walking and moving around Mobility: Walking and Moving Around Current Status [id number]): At least 60 percent but less than 80 percent impaired, limited or restricted Mobility: Walking and Moving Around Goal Status [phone]): At least 20 percent but less than 40 percent impaired, limited or restricted    Reginia Naas 12/02/2016, 1:53 PM  Magda Kiel, [place] 12/02/2016

## 2016-12-02 NOTE — Consult Note (Signed)
Physical Medicine and Rehabilitation Consult Reason for Consult: Lumbar stenosis with radiculopathy Referring Physician: Dr. Rolena Infante   HPI: [name] is a 56 y.o. right handed male with history of hypertension, diabetes mellitus. Per chart review patient lives with wife and daughter. Daughter has recently lost her job and can assist as needed. Wife works during the day. Patient independent prior to admission and still working. 2 level home with bedroom upstairs. Presented 12/01/2016 with low back pain radiating to the lower extremities. X-rays and imaging revealed severe lumbar stenosis with radiculopathy L2-3, 3-4 4-5. No change with conservative care. Underwent lumbar decompression L2-L5 12/01/2016 per Dr. Rolena Infante. Hospital course pain management. Back brace when out of bed applied sitting position. Bouts of constipation with laxative assistance. Physical therapy evaluation completed with recommendations of physical medicine rehabilitation consult. Patient has not worked out for about a year because of increasing back pain. Also has noted right greater than left leg pain.  Review of Systems  Constitutional: Negative for chills and fever.       Lower extremity weakness  HENT: Negative for hearing loss and tinnitus.   Eyes: Negative for blurred vision and double vision.  Respiratory: Negative for cough and shortness of breath.   Cardiovascular: Negative for chest pain, palpitations and leg swelling.  Gastrointestinal: Positive for constipation. Negative for nausea and vomiting.  Genitourinary: Positive for urgency. Negative for dysuria, flank pain and hematuria.  Musculoskeletal: Positive for back pain and myalgias.  Skin: Negative for rash.  Neurological: Negative for seizures.  All other systems reviewed and are negative.  Past Medical History:  Diagnosis Date  . Arthritis   . Diabetes mellitus without complication ([place]) Resolved 2014 after weight loss  . Hyperlipidemia  Resolved 2014 after weight loss  . Hypertension   . Weight loss    Past Surgical History:  Procedure Laterality Date  . LAPAROSCOPIC GASTRIC SLEEVE RESECTION    . LUMBAR LAMINECTOMY  12/01/2016   Lumbar decompression L2-5 LUMBAR LAMINECTOMY/DECOMPRESSION MICRODISCECTOMY 3 LEVELS (N/A)  . LUMBAR LAMINECTOMY/DECOMPRESSION MICRODISCECTOMY N/A 12/01/2016   Procedure: Lumbar decompression L2-5 LUMBAR LAMINECTOMY/DECOMPRESSION MICRODISCECTOMY 3 LEVELS;  Surgeon: Melina Schools, MD;  Location: [facility];  Service: Orthopedics;  Laterality: N/A;   Family History  Problem Relation Age of Onset  . Diabetes Mother   . Hypertension Mother   . Diabetes Father   . Hypertension Father    Social History:  reports that he has never smoked. He has never used smokeless tobacco. He reports that he does not drink alcohol or use drugs. Allergies:  Allergies  Allergen Reactions  . No Known Allergies    Medications Prior to Admission  Medication Sig Dispense Refill  . aspirin EC 81 MG tablet Take 81 mg by mouth daily.    Marland Kitchen eplerenone (INSPRA) 50 MG tablet Take 25 mg by mouth daily.     . Multiple Vitamins-Minerals (MULTIVITAMIN WITH MINERALS) tablet Take 1 tablet by mouth daily.    . Olmesartan-Amlodipine-HCTZ (TRIBENZOR) 40-5-25 MG TABS Take 1 tablet by mouth daily.     . pregabalin (LYRICA) 50 MG capsule Take 50 mg by mouth daily.    . traMADol (ULTRAM) 50 MG tablet Take 50 mg by mouth every 6 (six) hours as needed for moderate pain.       Home: Home Living Family/patient expects to be discharged to:: Private residence Living Arrangements: Spouse/significant other, Children Available Help at Discharge: Family Type of Home: House Home Access: Stairs to enter [employer] of  Steps: 2 Entrance Stairs-Rails: None Home Layout: Bed/bath upstairs, Two level Alternate Level Stairs-Number of Steps: 12 Alternate Level Stairs-Rails: Right Bathroom Shower/Tub: Chiropodist:  Standard Bathroom Accessibility:  (unsure) Home Equipment: Walker - 2 wheels, Shower seat Additional Comments: states he slid up/down stairs on his butt due to wekness; unable to get in tub for last few weeks due to weakness   Functional History: Prior Function Level of Independence: Needs assistance Gait / Transfers Assistance Needed: was getting some help couple of days prior to surgery; last two times negotiated stairs bumping on his bottom ADL's / Homemaking Assistance Needed: was unable to get into shower just prior to surgery, was doing sponge baths (family assisted as needed) Comments: was using walker couple of days prior to surgery Functional Status:  Mobility: Bed Mobility Overal bed mobility: Needs Assistance Bed Mobility: Rolling, Sit to Sidelying Rolling: Min assist Sidelying to sit: Mod assist Sit to sidelying: Max assist General bed mobility comments: total A to lift BLE back onto bed. Mod A to scoot back while on side Transfers Overall transfer level: Needs assistance Equipment used: Rolling walker (2 wheeled) Transfers: Sit to/from Stand Sit to Stand: From elevated surface, Max assist, +2 physical assistance General transfer comment: unable with +1. will need +2 Max A. May benefi tform suing [place] Ambulation/Gait Ambulation/Gait assistance: Mod assist, +2 safety/equipment Ambulation Distance (Feet): 2 Feet Assistive device: Rolling walker (2 wheeled) Gait Pattern/deviations: Step-to pattern, Shuffle, Trunk flexed, Wide base of support, Decreased stride length General Gait Details: 2-3 steps forward and back with RW, R LE buckling, difficulty progressing and heavy UE use on walker; did not pivot to sit in recliner due to low seat and pt 6'7" with long legs and would not be able to stand from low seat    ADL: ADL Overall ADL's : Needs assistance/impaired Grooming: Set up, Sitting Upper Body Bathing: Set up, Sitting Lower Body Bathing: Maximal assistance,  Sitting/lateral leans Upper Body Dressing : Minimal assistance Upper Body Dressing Details (indicate cue type and reason): for corsett Lower Body Dressing: Total assistance, Sitting/lateral leans Toilet Transfer Details (indicate cue type and reason): unable to safely attempt at this time. Will need drop arm BSC Toileting- Clothing Manipulation and Hygiene: Maximal assistance Functional mobility during ADLs: +2 for physical assistance (unable to stand pt at this time. Pt reports Max A +2`) General ADL Comments: Pt given back precaution sheet. Began to review precautions. Pt will need AE  Cognition: Cognition Overall Cognitive Status: Within Functional Limits for tasks assessed Orientation Level: Oriented X4 Cognition Arousal/Alertness: Awake/alert Behavior During Therapy: WFL for tasks assessed/performed Overall Cognitive Status: Within Functional Limits for tasks assessed  Blood pressure 133/61, pulse 62, temperature 98.7 F (37.1 C), temperature source Oral, resp. rate 20, SpO2 99 %. Physical Exam  Constitutional: He is oriented to person, place, and time. He appears well-developed.  HENT:  Head: Normocephalic.  Eyes: EOM are normal.  Neck: Normal range of motion. Neck supple. No thyromegaly present.  Cardiovascular: Normal rate, regular rhythm and normal heart sounds.   Respiratory: Effort normal and breath sounds normal. No respiratory distress.  GI: Soft. Bowel sounds are normal. He exhibits no distension. There is no tenderness. There is no rebound.  Neurological: He is alert and oriented to person, place, and time.  Skin:  Back incision is dressed with Hemovac drain in place  Motor strength is 5/5 bilateral deltoid, biceps, triceps, grip. 2 minus in the right hip flexor, knee extensor, hip adductor, ankle  dorsiflexor and plantar flexor. 3 minus in the left hip flexor, knee extensor 4 minus and ankle dorsiflexor, plantar flexor Sensation reduced in the right L1-S1 dermatomal  distribution, intact in the upper limbs, intact in the left lower extremity  Results for orders placed or performed during the hospital encounter of 12/01/16 (from the past 24 hour(s))  Glucose, capillary     Status: Abnormal   Collection Time: 12/02/16 11:32 AM  Result Value Ref Range   Glucose-Capillary 115 (H) 65 - 99 mg/dL   Dg Lumbar Spine 2-3 Views  Addendum Date: 12/01/2016   ADDENDUM REPORT: 12/01/2016 12:52 ADDENDUM: Additional imaging was at at after the initial interpretation. The second image demonstrates posterior surgical instruments directed at the L4-5 level. The third image (image labeled #2) demonstrates posterior surgical instruments extending from L2-3 to the L5 vertebral body. Electronically Signed   By: Rolm Baptise M.D.   On: 12/01/2016 12:52   Result Date: 12/01/2016 CLINICAL DATA:  L2-L5 lumbar decompression. EXAM: LUMBAR SPINE - 2-3 VIEW COMPARISON:  MRI 10/18/2016 FINDINGS: Posterior needles are directed along the superior aspect of the L2 and L4 spinous processes. IMPRESSION: Intraoperative localization as above. Electronically Signed: By: Rolm Baptise M.D. On: 12/01/2016 09:58   Mr Lumbar Spine Wo Contrast  Result Date: 12/01/2016 CLINICAL DATA:  Bilateral lower extremity weakness. Lumbar decompression earlier today. EXAM: MRI LUMBAR SPINE WITHOUT CONTRAST TECHNIQUE: Multiplanar, multisequence MR imaging of the lumbar spine was performed. No intravenous contrast was administered. COMPARISON:  10/18/2016 FINDINGS: The patient had difficulty tolerating the examination. The study is motion degraded, and there is decreased signal through the lower thoracic spine. The axial sequences do not cross reference to the sagittal sequences. Segmentation:  Standard. Alignment:  Unchanged.  No listhesis. Vertebrae: Preserved vertebral body heights without evidence of fracture or suspicious osseous lesion. Mild degenerative endplate edema at 075-GRM. Congenitally short pedicles. Conus  medullaris: Extends to the L2 level. Sagittal T2 and STIR sequences demonstrate new abnormal hyperintense signal abnormality within the distal cord extending from the L1 level superiorly into the visualized lower thoracic spine. Axial T2 sequences are motion degraded, however some images clearly demonstrate T2 hyperintensity involving much of the cross-section of the cord, greatest centrally (for example series 3 images 5 and 17). Paraspinal and other soft tissues: Postoperative changes in the posterior soft tissues from L2-L5 with a surgical drain in place. Fluid is present in the surgical bed without a compressive epidural collection identified. T2 hyperintense lesions are again noted in left kidney, likely cysts. Disc levels: T10-11: Minimal disc bulging, congenitally short pedicles, and facet hypertrophy result in mild spinal stenosis. The neural foramina are not well evaluated. T11-12: Minimal disc bulging, congenitally short pedicles, and moderate facet and ligamentum flavum hypertrophy result in mild-to-moderate spinal stenosis and mild-to-moderate bilateral neural foraminal stenosis. T12-L1: Congenitally short pedicles and facet and ligamentum flavum hypertrophy result in mild spinal stenosis. No significant neural foraminal stenosis. L1-2: The possible left paracentral/subarticular disc fragment described on the prior study is less well seen on the current examination due to study quality though is likely still present. Circumferential disc bulging, congenitally short pedicles, and facet and ligamentum flavum hypertrophy result in mild spinal stenosis, mild right and moderate left lateral recess stenosis, and mild bilateral neural foraminal stenosis, similar to prior. L2-3: Interval laminectomies with mild residual spinal stenosis, improved from prior. Disc bulging and facet hypertrophy result in mild bilateral neural foraminal stenosis, unchanged. L3-4: Interval laminectomies without residual spinal  stenosis. Disc bulging and  facet hypertrophy result in mild right and mild-to-moderate left neural foraminal stenosis, unchanged. L4-5: Interval laminectomies without residual spinal stenosis. Disc bulging and facet hypertrophy result in mild-to-moderate bilateral neural foraminal stenosis, unchanged. L5-S1: Moderate disc space narrowing with disc desiccation. Interval left laminotomy. Circumferential disc bulging and facet hypertrophy result in moderate right and mild residual left lateral recess stenosis and moderate right greater than left neural foraminal stenosis. IMPRESSION: 1. New T2 edema throughout the distal spinal cord extending from L1 into the visualized lower thoracic spine. No high-grade compressive spinal stenosis is identified, and this may reflect ischemia from a vascular insult. 2. Interval L2-L5 posterior decompression as above. 3. 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 Findings discussed via telephone with Dr. Rolena Infante on 12/01/2016 at 6:30 p.m. Electronically Signed   By: Logan Bores M.D.   On: 12/01/2016 19:03    Assessment/Plan: Diagnosis: Thoracic myelopathy with paraparesis, right greater than left lower limb 1. Does the need for close, 24 hr/day medical supervision in concert with the patient's rehab needs make it unreasonable for this patient to be served in a less intensive setting? Yes 2. Co-Morbidities requiring supervision/potential complications: Suspect neurogenic bladder. No void in approximate 6 hours post-cath,  hypertension, diabetes. 3. Due to bladder management, bowel management, safety, skin/wound care, disease management, medication administration, pain management and patient education, does the patient require 24 hr/day rehab nursing? Yes 4. Does the patient require coordinated care of a physician, rehab nurse, PT (1-2 hrs/day, 5 days/week) and OT (1-2 hrs/day, 5 days/week) to address physical and functional deficits in the context of  the above medical diagnosis(es)? Yes Addressing deficits in the following areas: balance, endurance, locomotion, strength, transferring, bowel/bladder control, bathing, dressing, feeding, grooming and toileting 5. Can the patient actively participate in an intensive therapy program of at least 3 hrs of therapy per day at least 5 days per week? Yes 6. The potential for patient to make measurable gains while on inpatient rehab is excellent 7. Anticipated functional outcomes upon discharge from inpatient rehab are modified independent and supervision  with PT, modified independent and supervision with OT, n/a with SLP. 8. Estimated rehab length of stay to reach the above functional goals is: 12-15d 9. Does the patient have adequate social supports and living environment to accommodate these discharge functional goals? Yes 10. Anticipated D/C setting: Home 11. Anticipated post D/C treatments: [place] therapy 12. Overall Rehab/Functional Prognosis: excellent  RECOMMENDATIONS: This patient's condition is appropriate for continued rehabilitative care in the following setting: CIR Patient has agreed to participate in recommended program. Yes Note that insurance prior authorization may be required for reimbursement for recommended care.  Comment: We will need to assess bladder, check post void residuals, may need to institute intermittent catheterization program   ANGIULLI,DANIEL J., PA-C 12/02/2016

## 2016-12-02 NOTE — Progress Notes (Signed)
RN spoke with [facility] with service response that states patients dinner tray was reordered at 0645 and should be getting to unit. RN stated she would be happy to pick tray up. Deena states tray is in transit.  RN will continue to monitor.

## 2016-12-03 DIAGNOSIS — M48062 Spinal stenosis, lumbar region with neurogenic claudication: Secondary | ICD-10-CM | POA: Diagnosis not present

## 2016-12-03 MED ORDER — MAGNESIUM CITRATE PO SOLN
0.5000 | Freq: Once | ORAL | Status: DC
  Filled 2016-12-03: qty 296

## 2016-12-03 MED ORDER — FLEET ENEMA 7-19 GM/118ML RE ENEM: 1.0000 | ENEMA | Freq: Once | RECTAL | Status: DC

## 2016-12-03 NOTE — Progress Notes (Signed)
OT Evaluation - late entry  PTA, pt independent with ADL and mobility although mobility and ADL tasks had become more difficult @ 2 weeks PTA. Pt currently requires Max A +2 for mobility and Max A with LB ADL. Pt is excellent CIR candidate and will benefit from intensive rehab to maximize functional level of performance and facilitate safe DC home. Pt very motivated to wok hard. Will follow acutely.    12/02/16 1400  OT Visit Information  Last OT Received On 12/02/16  Assistance Needed +2  History of Present Illness PAtient is a 56 y/o male admitted with severe central stenosis at L2-3, L3-4, L4-5 and neurogenic claudication, now s/p Lumbar decompression L2-5 LUMBAR LAMINECTOMY/DECOMPRESSION MICRODISCECTOMY 3 LEVELS  Precautions  Precautions Fall;Back  Precaution Booklet Issued Yes (comment)  Required Braces or Orthoses Spinal Brace (too small - PT calling office)  Spinal Brace Lumbar corset;Applied in sitting position  Home Living  Family/patient expects to be discharged to: Private residence  Living Arrangements Spouse/significant other;Children  Available Help at Discharge Family  Type of [place]ill to enter  Entrance Stairs-Number of Steps 2  Entrance Stairs-Rails None  Home Layout Bed/bath upstairs;Two level  Alternate Level Stairs-Number of Steps 12  Alternate Level Stairs-Rails Right  Bathroom Shower/Tub Tub/shower unit  Corporate treasurer (unsure)  Home Equipment Walker - 2 wheels;Shower seat  Additional Comments states he slid up/down stairs on his butt due to wekness; unable to get in tub for last few weeks due to weakness   Prior Function  Level of Independence Needs assistance  Gait / Transfers Assistance Needed was getting some help couple of days prior to surgery; last two times negotiated stairs bumping on his bottom  ADL's / [place] was unable to get into shower just prior to surgery, was doing  sponge baths (family assisted as needed)  Comments was using walker couple of days prior to surgery  Communication  Communication No difficulties  Pain Assessment  Pain Assessment Faces  Faces Pain Scale 4  Pain Location back  Pain Descriptors / Indicators Discomfort;Grimacing  Pain Intervention(s) Limited activity within patient's tolerance  Cognition  Arousal/Alertness Awake/alert  Behavior During Therapy WFL for tasks assessed/performed  Overall Cognitive Status Within Functional Limits for tasks assessed  Upper Extremity Assessment  Upper Extremity Assessment Overall WFL for tasks assessed  Lower Extremity Assessment  Lower Extremity Assessment Defer to PT evaluation  RLE Deficits / Details AAROM limited knee flexion with h/o arthritis, strength hip flexion 2/5, knee extension 3-/5, ankle DF 3-/5 (poor control/coordiantion of LE)  RLE Sensation decreased light touch (proprioception not specifically testing, but difficulty placing for tranfers)  LLE Deficits / Details AAROM limited knee flexion about 70 in supine (more in sitting), strength hip flexion 2+/5, knee extension 4/5, ankle DF 4-/5  LLE Sensation decreased light touch  Cervical / Trunk Assessment  Cervical / Trunk Assessment Other exceptions (surgery)  ADL  Overall ADL's  Needs assistance/impaired  Grooming Set up;Sitting  Upper Body Bathing Set up;Sitting  Lower Body Bathing Maximal assistance;Sitting/lateral leans  Upper Body Dressing  Minimal assistance  Upper Body Dressing Details (indicate cue type and reason) for corsett  Lower Body Dressing Total assistance;Sitting/lateral leans  Toilet Transfer Details (indicate cue type and reason) unable to safely attempt at this time. Will need drop arm BSC  Toileting- Clothing Manipulation and Hygiene Maximal assistance  Functional mobility during ADLs +2 for physical assistance (unable to stand pt at this  time. Pt reports Max A +2`)  General ADL Comments Pt given back  precaution sheet. Began to review precautions. Pt will need AE  Bed Mobility  Overal bed mobility Needs Assistance  Bed Mobility Rolling;Sit to Sidelying  Rolling Min assist  Sit to sidelying Max assist  General bed mobility comments total A to lift BLE back onto bed. Mod A to scoot back while on side  Transfers  Overall transfer level Needs assistance  General transfer comment unable with +1. will need +2 Max A. May benefi tform suing [place]  Balance  Overall balance assessment Needs assistance  Sitting-balance support Bilateral upper extremity supported  Sitting balance-Leahy Scale Poor  Sitting balance - Comments difficulty getting balanced at first reportedly, reliant on UE support at EOB, but only S level A  OT - End of Session  Equipment Utilized During Treatment Back brace  Activity Tolerance Patient tolerated treatment well  Patient left in bed;with call bell/phone within reach  Nurse Communication Mobility status;Precautions;Need for lift equipment (bariactric Keyser)  OT Assessment  OT Recommendation/Assessment Patient needs continued OT Services  OT Problem List Decreased strength;Decreased range of motion;Decreased activity tolerance;Impaired balance (sitting and/or standing);Decreased coordination;Decreased safety awareness;Decreased knowledge of use of DME or AE;Decreased knowledge of precautions;Impaired sensation;Impaired tone;Obesity;Pain  OT Plan  OT Frequency (ACUTE ONLY) Min 3X/week  OT Treatment/Interventions (ACUTE ONLY) Self-care/ADL training;DME and/or AE instruction;Therapeutic activities;Patient/family education;Balance training  OT Recommendation  Recommendations for Other Services Rehab consult  Follow Up Recommendations CIR;Supervision/Assistance - 24 hour  OT Equipment 3 in 1 bedside commode;Tub/shower bench;Wheelchair (measurements OT);Wheelchair cushion (measurements OT) (wc if pt does not progress)  Individuals Consulted  Consulted and Agree with  Results and Recommendations Patient  Acute Rehab OT Goals  Patient Stated Goal To get stronger, return to independent  OT Goal Formulation With patient  Time For Goal Achievement 12/16/16  Potential to Achieve Goals Good  OT Time Calculation  OT Start Time (ACUTE ONLY) 1129  OT Stop Time (ACUTE ONLY) 1148  OT Time Calculation (min) 19 min  OT General Charges  $OT Visit 1 Procedure  OT Evaluation  $OT Eval Moderate Complexity 1 Procedure  Written Expression  Dominant Hand Right   Maurie Boettcher, OTR/L  [phone] 12/03/2016

## 2016-12-03 NOTE — Progress Notes (Signed)
    Subjective: Procedure(s) (LRB): Lumbar decompression L2-5 LUMBAR LAMINECTOMY/DECOMPRESSION MICRODISCECTOMY 3 LEVELS (N/A) 2 Days Post-Op  Patient reports pain as 2 on 0-10 scale.  Reports decreased leg pain reports incisional back pain   Positive void Negative bowel movement Positive flatus Negative chest pain or shortness of breath  Objective: Vital signs in last 24 hours: Temp:  [98.7 F (37.1 C)-99.4 F (37.4 C)] 99 F (37.2 C) (01/12 0600) Pulse Rate:  [56-70] 70 (01/12 0600) Resp:  [18-20] 18 (01/12 0600) BP: (133-153)/(61-82) 151/82 (01/12 0600) SpO2:  [97 %-100 %] 100 % (01/12 0600)  Intake/Output from previous day: 01/11 0701 - 01/12 0700 In: 1063 [P.O.:560; I.V.:403; IV Piggyback:50] Out: 1960 [Urine:1800; Drains:160]  Labs: No results for input(s): WBC, RBC, HCT, PLT in the last 72 hours. No results for input(s): NA, K, CL, CO2, BUN, CREATININE, GLUCOSE, CALCIUM in the last 72 hours. No results for input(s): LABPT, INR in the last 72 hours.  Physical Exam: ABD soft Intact pulses distally Incision: dressing C/D/I and no drainage Compartment soft Neuro exam:  right LE: positive gloval weakness.  Sensation to light touch improving  Assessment/Plan: Patient stable  xrays n/a Continue mobilization with physical therapy Continue care  Advance diet Up with therapy  Await input about CIR placement. Constipation: will give enema and mag citrate No incontinence of bowel or bladder.  Neuro exam slowly improving.  I am optimistic about his ability to regain strength and ambulate again.   Drain removed, will d/c antibiotics Melina Schools, MD [place] [phone]

## 2016-12-03 NOTE — PMR Pre-admission (Signed)
PMR Admission Coordinator Pre-Admission Assessment  Patient: [name] is an 56 y.o., male MRN: [id number] DOB: August 01, 1961 Height:   Weight:                Insurance Information HMO:     PPO: yes     PCP:      IPA:      80/20:      OTHER:  PRIMARY: [employer]    Policy#: [id number]     Subscriber: pt CM Name: Butch Penny      Phone#: [phone] ext [phone]     Fax#: EPIC access Pre-Cert#: [id number] approved for 7 days      Employer:  Benefits:  Phone #: [phone]     Name: 12/03/16 Eff. Date: 11/22/16     Deduct: $1500       Out of Pocket Max: $3500      Life Max: none CIR: 80%      SNF: 80% 60 days Outpatient: $40 co pay per visit     Co-Pay: 20 visits combined Home Health: 80%      Co-Pay: 60 visits combined DME: 80%     Co-Pay: 20% Providers: in network  SECONDARY: none        Medicaid Application Date:       Case Manager:  Disability Application Date:       Case Worker:   Emergency Contact Information Contact Information    Name Relation Home Work Mobile   Drouillard,Valarie Spouse   [phone]     Current Medical History  Patient Admitting Diagnosis:  Thoracic myelopathy with paraparesis, right greater than left LE  History of Present Illness:: Alex F Diggsis a 56 y.o.right handed malewith history of hypertension, prediabetic .Presented01/08/2017 with low back pain radiating to the lower extremities. X-rays and imaging revealed severe lumbar stenosis with radiculopathy L2-3, 3-4 4-5. No change with conservative care. Underwent lumbar decompression L2-L5 12/01/2016 per Dr. Rolena Infante. Hospital course pain management. Back brace when out of bed applied sitting position. Bouts of constipation with laxative assistance.   Past Medical History  Past Medical History:  Diagnosis Date  . Arthritis   . Diabetes mellitus without complication ([place]) Resolved 2014 after weight loss  . Hyperlipidemia Resolved 2014 after weight loss  . Hypertension   . Weight loss     Family History   family history includes Diabetes in his father and mother; Hypertension in his father and mother.  Prior Rehab/Hospitalizations:  Has the patient had major surgery during 100 days prior to admission? No  Current Medications   Current Facility-Administered Medications:  .  0.9 %  sodium chloride infusion, 250 mL, Intravenous, Continuous, Melina Schools, MD .  acetaminophen (TYLENOL) tablet 650 mg, 650 mg, Oral, Q4H PRN **OR** acetaminophen (TYLENOL) suppository 650 mg, 650 mg, Rectal, Q4H PRN, Melina Schools, MD .  irbesartan (AVAPRO) tablet 300 mg, 300 mg, Oral, Daily, 300 mg at 12/03/16 0933 **AND** amLODipine (NORVASC) tablet 5 mg, 5 mg, Oral, Daily, Melina Schools, MD, 5 mg at 12/03/16 0933 .  hydrochlorothiazide (HYDRODIURIL) tablet 25 mg, 25 mg, Oral, Daily, Melina Schools, MD, 25 mg at 12/02/16 1751 .  lactated ringers infusion, , Intravenous, Continuous, Melina Schools, MD, Stopped at 12/02/16 [phone] .  [START ON 12/04/2016] magnesium citrate solution 0.5 Bottle, 0.5 Bottle, Oral, Once, Melina Schools, MD .  menthol-cetylpyridinium (CEPACOL) lozenge 3 mg, 1 lozenge, Oral, PRN **OR** phenol (CHLORASEPTIC) mouth spray 1 spray, 1 spray, Mouth/Throat, PRN, Melina Schools, MD .  methocarbamol (ROBAXIN) tablet 500  mg, 500 mg, Oral, Q6H PRN **OR** methocarbamol (ROBAXIN) 500 mg in dextrose 5 % 50 mL IVPB, 500 mg, Intravenous, Q6H PRN, Melina Schools, MD .  naphazoline-glycerin (CLEAR EYES) ophth solution 1-2 drop, 1-2 drop, Both Eyes, Q12H PRN, Melina Schools, MD .  ondansetron (ZOFRAN) injection 4 mg, 4 mg, Intravenous, Q4H PRN, Melina Schools, MD .  oxyCODONE-acetaminophen (PERCOCET/ROXICET) 5-325 MG per tablet 1-2 tablet, 1-2 tablet, Oral, Q4H PRN, Melina Schools, MD .  polyethylene glycol (MIRALAX / GLYCOLAX) packet 17 g, 17 g, Oral, Daily PRN, Melina Schools, MD .  sodium chloride flush (NS) 0.9 % injection 3 mL, 3 mL, Intravenous, Q12H, Melina Schools, MD, 3 mL at 12/03/16 0934 .  sodium chloride flush  (NS) 0.9 % injection 3 mL, 3 mL, Intravenous, PRN, Melina Schools, MD .  Derrill Memo ON 12/04/2016] sodium phosphate (FLEET) 7-19 GM/118ML enema 1 enema, 1 enema, Rectal, Once, Melina Schools, MD .  spironolactone (ALDACTONE) tablet 25 mg, 25 mg, Oral, Daily, Melina Schools, MD, 25 mg at 12/03/16 [id number]  Patients Current Diet: Diet regular Room service appropriate? Yes; Fluid consistency: Thin  Precautions / Restrictions Precautions Precautions: Fall, Back Precaution Booklet Issued: Yes (comment) Precaution Comments: pt education on precautions prior to tx.  Spinal Brace: Lumbar corset, Applied in sitting position Restrictions Weight Bearing Restrictions: No   Has the patient had 2 or more falls or a fall with injury in the past year?No  Prior Activity Level Community (5-7x/wk): working until [employer] but was I. uses RW 3 days prior to admit due to weakness. Was driving  Development worker, international aid / [place] Devices/Equipment: Environmental consultant (specify type), Contact lenses Home Equipment: Walker - 2 wheels, Shower seat  Prior Device Use: Indicate devices/aids used by the patient prior to current illness, exacerbation or injury? None of the above  Prior Functional Level Prior Function Level of Independence: Needs assistance Gait / Transfers Assistance Needed: was getting some help couple of days prior to surgery; last two times negotiated stairs bumping on his bottom ADL's / Homemaking Assistance Needed: was unable to get into shower just prior to surgery, was doing sponge baths Comments: was using walker couple of days prior to surgery  Self Care: Did the patient need help bathing, dressing, using the toilet or eating?  Independent  Indoor Mobility: Did the patient need assistance with walking from room to room (with or without device)? Independent  Stairs: Did the patient need assistance with internal or external stairs (with or without device)? Independent  Functional Cognition:  Did the patient need help planning regular tasks such as shopping or remembering to take medications? Independent  Current Functional Level Cognition  Overall Cognitive Status: Within Functional Limits for tasks assessed Orientation Level: Oriented X4    Extremity Assessment (includes Sensation/Coordination)  Upper Extremity Assessment: Overall WFL for tasks assessed  Lower Extremity Assessment: Defer to PT evaluation RLE Deficits / Details: AAROM limited knee flexion with h/o arthritis, strength hip flexion 2/5, knee extension 3-/5, ankle DF 3-/5 (poor control/coordiantion of LE) RLE Sensation: decreased light touch (proprioception not specifically testing, but difficulty placing for tranfers) LLE Deficits / Details: AAROM limited knee flexion about 70 in supine (more in sitting), strength hip flexion 2+/5, knee extension 4/5, ankle DF 4-/5 LLE Sensation: decreased light touch    ADLs  Overall ADL's : Needs assistance/impaired Grooming: Set up, Supervision/safety Grooming Details (indicate cue type and reason): completed oral care/washing face at sink sitting on stedy. Requried 1 hand to support trunk at all times  Upper Body Bathing: Set up, Sitting Lower Body Bathing: Maximal assistance, Sitting/lateral leans Upper Body Dressing : Minimal assistance Upper Body Dressing Details (indicate cue type and reason): for corsett Lower Body Dressing: Total assistance, Sitting/lateral leans Toilet Transfer: +2 for safety/equipment, Moderate assistance (sit - stand using Stedy and bed pad; simulated) Toilet Transfer Details (indicate cue type and reason): unable to safely attempt at this time. Will need drop arm BSC Toileting- Clothing Manipulation and Hygiene: Maximal assistance Toileting - Clothing Manipulation Details (indicate cue type and reason): using urinal with set up Functional mobility during ADLs: +2 for physical assistance, Moderate assistance (from elevated surface) General ADL  Comments: Pt given back precaution sheet. Began to review precautions. Pt will need AE    Mobility  Overal bed mobility: Needs Assistance Bed Mobility: Rolling, Sidelying to Sit Rolling: Min guard Sidelying to sit: +2 for physical assistance, Max assist Sit to sidelying: Max assist General bed mobility comments: Assist to lower BLE off bed and Assist to elevate trunk    Transfers  Overall transfer level: Needs assistance Equipment used:  Charlaine Dalton) Transfer via Lift Equipment: Stedy (bariatric) Transfers: Sit to/from Stand Sit to Stand: Mod assist, +2 physical assistance, From elevated surface General transfer comment: Pt sit 4 sit to stands with stedy: 1 from elevated bed and 1 from lower seated height of bed and 2 from stedy plates. Pt required mod A +2 using chux pad for all sit to stand transfers due to lack of quad control and trunk weakness. With final eccentric lower to chair, pt presented with B spacticity; PTA used tactile cuing for feet placement.      Ambulation / Gait / Stairs / Wheelchair Mobility  Ambulation/Gait Ambulation/Gait assistance: Mod assist, +2 safety/equipment Ambulation Distance (Feet): 2 Feet Assistive device: Rolling walker (2 wheeled) Gait Pattern/deviations: Step-to pattern, Shuffle, Trunk flexed, Wide base of support, Decreased stride length General Gait Details: 2-3 steps forward and back with RW, R LE buckling, difficulty progressing and heavy UE use on walker; did not pivot to sit in recliner due to low seat and pt 6'7" with long legs and would not be able to stand from low seat    Posture / Balance Dynamic Sitting Balance Sitting balance - Comments: Unable to complete funcitonal activity without support from 1UE due to decreased trunk control Balance Overall balance assessment: Needs assistance Sitting-balance support: Single extremity supported Sitting balance-Leahy Scale: Poor Sitting balance - Comments: Unable to complete funcitonal activity without  support from 1UE due to decreased trunk control Standing balance support: Bilateral upper extremity supported Standing balance-Leahy Scale: Zero Standing balance comment: heavy UE reilance and mod support during transfers for safety.     Special needs/care consideration BiPAP/CPAP  N/a CPM  N/a Continuous Drip IV  N/a Dialysis  N/a Life Vest  N/a Oxygen  N/a Special Bed  N/a Trach Size  N/a Wound Vac (area)  N/a Skin surgical incision Bowel mgmt: LBM 1/10 but received Mg Citrate on 12/03/16 Bladder mgmt: continent Diabetic mgmt. pre diabetic   Previous Home Environment Living Arrangements:  (57 yo ans 58 yo daughters in the home also)  Lives With: Spouse, Daughter Available Help at Discharge: Family, Available 24 hours/day Type of Home: House Home Layout: Bed/bath upstairs, Two level Alternate Level Stairs-Rails: Right Alternate Level Stairs-Number of Steps: 12 Home Access: Stairs to enter Entrance Stairs-Rails: None Entrance Stairs-Number of Steps: 2 Bathroom Shower/Tub: Tub/shower unit, Architectural technologist: Standard Bathroom Accessibility: Yes How Accessible: Accessible via walker [place]:  No Additional Comments: states he slid up/down stairs on his butt due to wekness; unable to get in tub for last few weeks due to weakness   Discharge Living Setting Plans for Discharge Living Setting: Patient's home, Lives with (comment) (wife and two daughter 24 and 94 yo) Type of Home at Discharge: House Discharge Home Layout: 1/2 bath on main level, Two level, Bed/bath upstairs Alternate Level Stairs-Rails: Right Alternate Level Stairs-Number of Steps: flight Discharge Home Access: Stairs to enter Entrance Stairs-Rails: None Entrance Stairs-Number of Steps: 2 Discharge Bathroom Shower/Tub: Tub/shower unit, Curtain Discharge Bathroom Toilet: Standard Discharge Bathroom Accessibility: Yes How Accessible: Accessible via walker Does the patient have any problems  obtaining your medications?: No  Social/Family/Support Systems Patient Roles: Spouse, Parent, Other (Comment) (employee) Contact Information: Mateo Flow, wife Anticipated Caregiver: wife and two daughters Anticipated Caregiver's Contact Information: see above Ability/Limitations of Caregiver: wife works, 35 yo daughter unemployed; 70 yo daughte in school Caregiver Availability: 24/7 Discharge Plan Discussed with Primary Caregiver: Yes Is Caregiver In Agreement with Plan?: Yes Does Caregiver/Family have Issues with Lodging/Transportation while Pt is in Rehab?: No  Goals/Additional Needs Patient/Family Goal for Rehab: Mod I to supervision with PT and OT Expected length of stay: ELOS 12-15 days Pt/Family Agrees to Admission and willing to participate: Yes Program Orientation Provided & Reviewed with Pt/Caregiver Including Roles  & Responsibilities: Yes  Decrease burden of Care through IP rehab admission: n/a  Possible need for SNF placement upon discharge: not anticipated  Patient Condition: [name] conditions remains as documented on consult 12/02/2016 when Rehab MD felt pt would benefit from the coordinated team approach offered for an acute inpt rehab admission. Pt overall mod assist with functional transfers. I will admit pt tomorrow, 12/04/16 when bed is available.    Preadmission Screen Completed By:  Cleatrice Burke, 12/03/2016 4:07 PM ______________________________________________________________________   Discussed status with Dr. Naaman Plummer on 12/03/2016 at  1616 and received telephone approval for admission today.  Admission Coordinator:  Cleatrice Burke, time [id number] Date 12/03/2016

## 2016-12-03 NOTE — Progress Notes (Signed)
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 BP: 99/65 mmHg, feet in dependent position; After 5 minutes with feet elevated BP improved to 136/93 mmHg.  Pt tolerated increased activity during session. He performed multiple transfers with no c/o of pain. Able to remain sitting in the chair after treatment.    Follow Up Recommendations  CIR     Equipment Recommendations  Other (comment) (to be assessed)    Recommendations for Other Services       Precautions / Restrictions Precautions Precautions: Fall;Back Precaution Comments: pt education on precautions prior to tx.  Required Braces or Orthoses: Spinal Brace (Pt required min A for set up for brace application with mod A to remove velcro straps.) Spinal Brace: Lumbar corset;Applied in sitting position Restrictions Weight Bearing Restrictions: No    Mobility  Bed Mobility Overal bed mobility: Needs Assistance Bed Mobility: Rolling;Sidelying to Sit Rolling: Min assist Sidelying to sit: +2 for physical assistance;Max assist (B UE for support; SPTA guiding B LE while OT gave max A for trunk elevation due to severe weakness and safety. )       General bed mobility comments: Pt required increased time  Transfers Overall transfer level: Needs assistance Equipment used:  Charlaine Dalton) Transfers: Sit to/from Stand Sit to Stand: Mod assist;+2 physical assistance         General transfer comment: Pt sit 4 sit to stands with stedy: 1 from elevated bed and 1 from lower seated height of bed and 2 from stedy plates. Pt required  mod A +2 using chux pad for all sit to stand transfers due to lack of quad control and trunk weakness. With final eccentric lower to chair, pt presented with B spacticity; PTA used tactile cuing for feet placement.    Ambulation/Gait                 Stairs            Wheelchair Mobility    Modified Rankin (Stroke Patients Only)       Balance Overall balance assessment: Needs assistance Sitting-balance support: Bilateral upper extremity supported;Feet supported Sitting balance-Leahy Scale: Poor Sitting balance - Comments: Pt relied on single UE support from OT during ADL.    Standing balance support: Bilateral upper extremity supported Standing balance-Leahy Scale: Zero Standing balance comment: heavy UE reilance and mod support during transfers for safety.                     Cognition Arousal/Alertness: Awake/alert Behavior During Therapy: WFL for tasks assessed/performed Overall Cognitive Status: Within Functional Limits for tasks assessed                      Exercises      General Comments        Pertinent Vitals/Pain Pain Assessment: No/denies pain Faces Pain Scale: Hurts a little bit Pain Location: lumbar region, near incision.  Pain Descriptors / Indicators: Tightness;Discomfort Pain Intervention(s): Monitored during session;Repositioned    Home Living  Prior Function            PT Goals (current goals can now be found in the care plan section) Acute Rehab PT Goals Patient Stated Goal: To get stronger, return to independent PT Goal Formulation: With patient Potential to Achieve Goals: Good Progress towards PT goals: Progressing toward goals    Frequency    Min 5X/week      PT Plan Current plan remains appropriate    Co-evaluation PT/OT/SLP Co-Evaluation/Treatment: Yes Reason for Co-Treatment: Complexity of the patient's impairments (multi-system involvement);For patient/therapist  safety;To address functional/ADL transfers PT goals addressed during session: Mobility/safety with mobility;Balance OT goals addressed during session: ADL's and self-care;Proper use of Adaptive equipment and DME;Strengthening/ROM     End of Session Equipment Utilized During Treatment: Gait belt;Back brace (used chux pad to elevated pt into standing. ) Activity Tolerance: Patient limited by fatigue;Patient limited by pain Patient left: in chair;with call bell/phone within reach;with chair alarm set     Time: 1310-1351 PT Time Calculation (min) (ACUTE ONLY): 41 min  Charges:  $Therapeutic Activity: 23-37 mins                    G Codes:      [facility] 12-11-16, 2:24 PM  Olena [place], [place] Pager [phone]

## 2016-12-03 NOTE — Progress Notes (Addendum)
I have requested OT eval completion today so that I can begin [employer] insurance approval for a possible inpt rehab admission. I met briefly with pt to discuss a possible admission Saturday pending insurance approval. will follow up this morning. [phone]

## 2016-12-03 NOTE — Progress Notes (Signed)
I have insurance approval to admit pt to inpt rehab tomorrow with [employer]. Pt in agreement. I will make the arrangements. RN CM made aware. [id number]

## 2016-12-03 NOTE — Progress Notes (Signed)
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 Used Stedy today to assist with mobility with +2 Mod A. Pt able to complete grooming tasks at sink while sitting in stedy. Requried support of 1 arm throughout task due to trunk weakness. OOB to chair today. Orthostatic/symptomatic. 93/66; feet elevated after 5 min 136/93. Teds on during treatment. Continue to recommend CIR. Pt states he is anxious about mobility and "uncertainty". Continue to recommend CIR for rehab.   Follow Up Recommendations  CIR;Supervision/Assistance - 24 hour    Equipment Recommendations  3 in 1 bedside commode;Tub/shower bench;Wheelchair (measurements OT);Wheelchair cushion (measurements OT)    Recommendations for Other Services Rehab consult    Precautions / Restrictions Precautions Precautions: Fall;Back Precaution Comments: pt education on precautions prior to tx.  Required Braces or Orthoses: Spinal Brace Spinal Brace: Lumbar corset;Applied in sitting position Restrictions Weight Bearing Restrictions: No       Mobility Bed Mobility Overal bed mobility: Needs Assistance Bed Mobility: Rolling;Sidelying to Sit Rolling: Min guard Sidelying to sit: +2 for physical assistance;Max assist       General bed mobility comments: Assist to lower BLE off bed and Assist to elevate trunk  Transfers Overall transfer level: Needs assistance Equipment used:  Charlaine Dalton) Transfers: Sit to/from Stand Sit to Stand: Mod assist;+2 physical assistance;From elevated surface         General transfer comment: ? Increased BLE spasticity during mobility   Balance Overall balance  assessment: Needs assistance Sitting-balance support: Single extremity supported Sitting balance-Leahy Scale: Poor Sitting balance - Comments: Unable to complete funcitonal activity without support from 1UE due to decreased trunk control   Standing balance support: Bilateral upper extremity supported Standing balance-Leahy Scale: Zero Standing balance comment: heavy UE reilance and mod support during transfers for safety.                    ADL Overall ADL's : Needs assistance/impaired     Grooming: Set up;Supervision/safety Grooming Details (indicate cue type and reason): completed oral care/washing face at sink sitting on stedy. Requried 1 hand to support trunk at all times     Lower Body Bathing: Maximal assistance;Sitting/lateral leans           Toilet Transfer: +2 for safety/equipment;Moderate assistance (sit - stand using Stedy and bed pad; simulated)     Toileting - Clothing Manipulation Details (indicate cue type and reason): using urinal with set up     Functional mobility during ADLs: +2 for physical assistance;Moderate assistance (from elevated surface)        Vision                     Perception     Praxis      Cognition   Behavior During Therapy: Anxious Overall Cognitive Status: Within Functional Limits for tasks assessed                       Extremity/Trunk Assessment     BLE strength appears slightly improved from yesterday. Able to lift LLE off bed. Unable to extend/flex  RLE aginast gravity Decreased proprioception BLE          Exercises  Shoulder Instructions       General Comments      Pertinent Vitals/ Pain       Pain Assessment: Faces Faces Pain Scale: Hurts little more Pain Location: lumbar region, near incision.  Pain Descriptors / Indicators: Grimacing Pain Intervention(s): Limited activity within patient's tolerance;Repositioned  Home Living                                           Prior Functioning/Environment              Frequency  Min 3X/week        Progress Toward Goals  OT Goals(current goals can now be found in the care plan section)  Progress towards OT goals: Progressing toward goals  Acute Rehab OT Goals Patient Stated Goal: To get stronger, return to independent OT Goal Formulation: With patient Time For Goal Achievement: 12/16/16 Potential to Achieve Goals: Good ADL Goals Pt Will Perform Lower Body Bathing: with set-up;with supervision;with adaptive equipment;with caregiver independent in assisting;sitting/lateral leans Pt Will Perform Lower Body Dressing: with set-up;with supervision;sitting/lateral leans;with caregiver independent in assisting;with adaptive equipment Pt Will Transfer to Toilet: with min guard assist;bedside commode;squat pivot transfer Pt Will Perform Toileting - Clothing Manipulation and hygiene: with min guard assist;with adaptive equipment;sitting/lateral leans Additional ADL Goal #1: Pt will demonstrate 3/3 back precautions with min vc during ADL  Plan Discharge plan remains appropriate    Co-evaluation    PT/OT/SLP Co-Evaluation/Treatment: Yes Reason for Co-Treatment: Complexity of the patient's impairments (multi-system involvement);For patient/therapist safety;To address functional/ADL transfers PT goals addressed during session: Mobility/safety with mobility;Balance OT goals addressed during session: ADL's and self-care;Proper use of Adaptive equipment and DME;Strengthening/ROM      End of Session Equipment Utilized During Treatment: Back brace   Activity Tolerance Patient tolerated treatment well   Patient Left in chair;with call bell/phone within reach;with chair alarm set   Nurse Communication Mobility status;Need for lift equipment        Time: [id number] OT Time Calculation (min): 41 min  Charges: OT General Charges $OT Visit: 1 Procedure OT Treatments $Self Care/Home Management :  8-22 mins  Vernida Mcnicholas,HILLARY 12/03/2016, 2:40 PM   [facility], OTR/L  [phone] 12/03/2016

## 2016-12-04 ENCOUNTER — Inpatient Hospital Stay (HOSPITAL_COMMUNITY)
Admission: RE | Admit: 2016-12-04 | Discharge: 2016-12-28 | DRG: 560 | Disposition: A | Discharge status: Home Health | Payer: Managed Care, Other (non HMO) | Source: Intra-hospital | Attending: Physical Medicine & Rehabilitation | Admitting: Physical Medicine & Rehabilitation

## 2016-12-04 ENCOUNTER — Encounter (HOSPITAL_COMMUNITY): Payer: Self-pay

## 2016-12-04 DIAGNOSIS — E119 Type 2 diabetes mellitus without complications: Secondary | ICD-10-CM | POA: Diagnosis present

## 2016-12-04 DIAGNOSIS — I169 Hypertensive crisis, unspecified: Secondary | ICD-10-CM | POA: Diagnosis present

## 2016-12-04 DIAGNOSIS — Z7982 Long term (current) use of aspirin: Secondary | ICD-10-CM

## 2016-12-04 DIAGNOSIS — Z4789 Encounter for other orthopedic aftercare: Principal | ICD-10-CM

## 2016-12-04 DIAGNOSIS — G8918 Other acute postprocedural pain: Secondary | ICD-10-CM

## 2016-12-04 DIAGNOSIS — K5903 Drug induced constipation: Secondary | ICD-10-CM

## 2016-12-04 DIAGNOSIS — M5416 Radiculopathy, lumbar region: Secondary | ICD-10-CM | POA: Diagnosis present

## 2016-12-04 DIAGNOSIS — R0989 Other specified symptoms and signs involving the circulatory and respiratory systems: Secondary | ICD-10-CM | POA: Diagnosis not present

## 2016-12-04 DIAGNOSIS — I951 Orthostatic hypotension: Secondary | ICD-10-CM | POA: Diagnosis present

## 2016-12-04 DIAGNOSIS — K5900 Constipation, unspecified: Secondary | ICD-10-CM | POA: Diagnosis present

## 2016-12-04 DIAGNOSIS — R29898 Other symptoms and signs involving the musculoskeletal system: Secondary | ICD-10-CM

## 2016-12-04 DIAGNOSIS — E785 Hyperlipidemia, unspecified: Secondary | ICD-10-CM | POA: Diagnosis present

## 2016-12-04 DIAGNOSIS — Z79899 Other long term (current) drug therapy: Secondary | ICD-10-CM

## 2016-12-04 DIAGNOSIS — M5441 Lumbago with sciatica, right side: Secondary | ICD-10-CM | POA: Diagnosis not present

## 2016-12-04 DIAGNOSIS — R7303 Prediabetes: Secondary | ICD-10-CM

## 2016-12-04 DIAGNOSIS — I952 Hypotension due to drugs: Secondary | ICD-10-CM | POA: Diagnosis not present

## 2016-12-04 DIAGNOSIS — M7989 Other specified soft tissue disorders: Secondary | ICD-10-CM | POA: Diagnosis not present

## 2016-12-04 DIAGNOSIS — Z903 Acquired absence of stomach [part of]: Secondary | ICD-10-CM | POA: Diagnosis not present

## 2016-12-04 DIAGNOSIS — I10 Essential (primary) hypertension: Secondary | ICD-10-CM

## 2016-12-04 DIAGNOSIS — Z419 Encounter for procedure for purposes other than remedying health state, unspecified: Secondary | ICD-10-CM

## 2016-12-04 DIAGNOSIS — M48062 Spinal stenosis, lumbar region with neurogenic claudication: Secondary | ICD-10-CM | POA: Diagnosis not present

## 2016-12-04 DIAGNOSIS — R7989 Other specified abnormal findings of blood chemistry: Secondary | ICD-10-CM | POA: Diagnosis not present

## 2016-12-04 DIAGNOSIS — M5415 Radiculopathy, thoracolumbar region: Secondary | ICD-10-CM | POA: Diagnosis not present

## 2016-12-04 DIAGNOSIS — R42 Dizziness and giddiness: Secondary | ICD-10-CM | POA: Diagnosis not present

## 2016-12-04 DIAGNOSIS — G8929 Other chronic pain: Secondary | ICD-10-CM

## 2016-12-04 MED ORDER — HYDROCHLOROTHIAZIDE 25 MG PO TABS
25.0000 mg | ORAL_TABLET | Freq: Every day | ORAL | Status: DC
  Administered 2016-12-04 – 2016-12-07 (×4): 25 mg via ORAL
  Filled 2016-12-04 (×4): qty 1

## 2016-12-04 MED ORDER — METHOCARBAMOL 500 MG PO TABS
500.0000 mg | ORAL_TABLET | Freq: Four times a day (QID) | ORAL | Status: DC | PRN
  Administered 2016-12-10 – 2016-12-21 (×14): 500 mg via ORAL
  Filled 2016-12-04 (×17): qty 1

## 2016-12-04 MED ORDER — SORBITOL 70 % SOLN
30.0000 mL | Freq: Every day | Status: DC | PRN
  Administered 2016-12-12 – 2016-12-18 (×2): 30 mL via ORAL
  Filled 2016-12-04 (×3): qty 30

## 2016-12-04 MED ORDER — ACETAMINOPHEN 325 MG PO TABS
650.0000 mg | ORAL_TABLET | ORAL | Status: DC | PRN
  Filled 2016-12-04: qty 2

## 2016-12-04 MED ORDER — NAPHAZOLINE-GLYCERIN 0.012-0.2 % OP SOLN
1.0000 [drp] | Freq: Two times a day (BID) | OPHTHALMIC | Status: DC | PRN
  Filled 2016-12-04: qty 15

## 2016-12-04 MED ORDER — SPIRONOLACTONE 25 MG PO TABS
25.0000 mg | ORAL_TABLET | Freq: Every day | ORAL | Status: DC
  Administered 2016-12-05 – 2016-12-18 (×14): 25 mg via ORAL
  Filled 2016-12-04 (×15): qty 1

## 2016-12-04 MED ORDER — POLYETHYLENE GLYCOL 3350 17 G PO PACK
17.0000 g | PACK | Freq: Every day | ORAL | Status: DC | PRN
  Administered 2016-12-07 – 2016-12-13 (×3): 17 g via ORAL
  Filled 2016-12-04 (×4): qty 1

## 2016-12-04 MED ORDER — ACETAMINOPHEN 650 MG RE SUPP: 650.0000 mg | RECTAL | Status: DC | PRN

## 2016-12-04 MED ORDER — IRBESARTAN 300 MG PO TABS
300.0000 mg | ORAL_TABLET | Freq: Every day | ORAL | Status: DC
  Administered 2016-12-05 – 2016-12-18 (×14): 300 mg via ORAL
  Filled 2016-12-04 (×15): qty 1

## 2016-12-04 MED ORDER — METHOCARBAMOL 1000 MG/10ML IJ SOLN
500.0000 mg | Freq: Four times a day (QID) | INTRAVENOUS | Status: DC | PRN
  Filled 2016-12-04: qty 5

## 2016-12-04 MED ORDER — OXYCODONE-ACETAMINOPHEN 5-325 MG PO TABS
1.0000 | ORAL_TABLET | ORAL | Status: DC | PRN
  Administered 2016-12-06: 1 via ORAL
  Filled 2016-12-04 (×3): qty 2
  Filled 2016-12-04: qty 1
  Filled 2016-12-04: qty 2

## 2016-12-04 MED ORDER — ONDANSETRON HCL 4 MG PO TABS: 4.0000 mg | ORAL_TABLET | Freq: Four times a day (QID) | ORAL | Status: DC | PRN

## 2016-12-04 MED ORDER — ONDANSETRON HCL 4 MG/2ML IJ SOLN: 4.0000 mg | Freq: Four times a day (QID) | INTRAMUSCULAR | Status: DC | PRN

## 2016-12-04 MED ORDER — AMLODIPINE BESYLATE 5 MG PO TABS
5.0000 mg | ORAL_TABLET | Freq: Every day | ORAL | Status: DC
  Administered 2016-12-05 – 2016-12-08 (×4): 5 mg via ORAL
  Filled 2016-12-04 (×4): qty 1

## 2016-12-04 NOTE — Progress Notes (Signed)
Charlett Blake, MD Physician Signed Physical Medicine and Rehabilitation  Consult Note Date of Service: 12/02/2016 2:53 PM  Related encounter: Admission (Discharged) from 12/01/2016 in [place] All Collapse All   [] Hide copied text [] Hover for attribution information      Physical Medicine and Rehabilitation Consult Reason for Consult: Lumbar stenosis with radiculopathy Referring Physician: Dr. Rolena Infante   HPI: [name] is a 56 y.o. right handed male with history of hypertension, diabetes mellitus. Per chart review patient lives with wife and daughter. Daughter has recently lost her job and can assist as needed. Wife works during the day. Patient independent prior to admission and still working. 2 level home with bedroom upstairs. Presented 12/01/2016 with low back pain radiating to the lower extremities. X-rays and imaging revealed severe lumbar stenosis with radiculopathy L2-3, 3-4 4-5. No change with conservative care. Underwent lumbar decompression L2-L5 12/01/2016 per Dr. Rolena Infante. Hospital course pain management. Back brace when out of bed applied sitting position. Bouts of constipation with laxative assistance. Physical therapy evaluation completed with recommendations of physical medicine rehabilitation consult. Patient has not worked out for about a year because of increasing back pain. Also has noted right greater than left leg pain.  Review of Systems  Constitutional: Negative for chills and fever.       Lower extremity weakness  HENT: Negative for hearing loss and tinnitus.   Eyes: Negative for blurred vision and double vision.  Respiratory: Negative for cough and shortness of breath.   Cardiovascular: Negative for chest pain, palpitations and leg swelling.  Gastrointestinal: Positive for constipation. Negative for nausea and vomiting.  Genitourinary: Positive for urgency. Negative for dysuria, flank pain and  hematuria.  Musculoskeletal: Positive for back pain and myalgias.  Skin: Negative for rash.  Neurological: Negative for seizures.  All other systems reviewed and are negative.      Past Medical History:  Diagnosis Date  . Arthritis   . Diabetes mellitus without complication ([place]) Resolved 2014 after weight loss  . Hyperlipidemia Resolved 2014 after weight loss  . Hypertension   . Weight loss         Past Surgical History:  Procedure Laterality Date  . LAPAROSCOPIC GASTRIC SLEEVE RESECTION    . LUMBAR LAMINECTOMY  12/01/2016   Lumbar decompression L2-5 LUMBAR LAMINECTOMY/DECOMPRESSION MICRODISCECTOMY 3 LEVELS (N/A)  . LUMBAR LAMINECTOMY/DECOMPRESSION MICRODISCECTOMY N/A 12/01/2016   Procedure: Lumbar decompression L2-5 LUMBAR LAMINECTOMY/DECOMPRESSION MICRODISCECTOMY 3 LEVELS;  Surgeon: Melina Schools, MD;  Location: [place];  Service: Orthopedics;  Laterality: N/A;        Family History  Problem Relation Age of Onset  . Diabetes Mother   . Hypertension Mother   . Diabetes Father   . Hypertension Father    Social History:  reports that he has never smoked. He has never used smokeless tobacco. He reports that he does not drink alcohol or use drugs. Allergies:      Allergies  Allergen Reactions  . No Known Allergies          Medications Prior to Admission  Medication Sig Dispense Refill  . aspirin EC 81 MG tablet Take 81 mg by mouth daily.    Marland Kitchen eplerenone (INSPRA) 50 MG tablet Take 25 mg by mouth daily.     . Multiple Vitamins-Minerals (MULTIVITAMIN WITH MINERALS) tablet Take 1 tablet by mouth daily.    . Olmesartan-Amlodipine-HCTZ (TRIBENZOR) 40-5-25 MG TABS Take 1 tablet by mouth daily.     Marland Kitchen  pregabalin (LYRICA) 50 MG capsule Take 50 mg by mouth daily.    . traMADol (ULTRAM) 50 MG tablet Take 50 mg by mouth every 6 (six) hours as needed for moderate pain.       Home: Home Living Family/patient expects to be discharged to:: Private  residence Living Arrangements: Spouse/significant other, Children Available Help at Discharge: Family Type of Home: House Home Access: Stairs to enter Technical brewer of Steps: 2 Entrance Stairs-Rails: None Home Layout: Bed/bath upstairs, Two level Alternate Level Stairs-Number of Steps: 12 Alternate Level Stairs-Rails: Right Bathroom Shower/Tub: Chiropodist: Standard Bathroom Accessibility:  (unsure) Home Equipment: Environmental consultant - 2 wheels, Shower seat Additional Comments: states he slid up/down stairs on his butt due to wekness; unable to get in tub for last few weeks due to weakness   Functional History: Prior Function Level of Independence: Needs assistance Gait / Transfers Assistance Needed: was getting some help couple of days prior to surgery; last two times negotiated stairs bumping on his bottom ADL's / Homemaking Assistance Needed: was unable to get into shower just prior to surgery, was doing sponge baths (family assisted as needed) Comments: was using walker couple of days prior to surgery Functional Status:  Mobility: Bed Mobility Overal bed mobility: Needs Assistance Bed Mobility: Rolling, Sit to Sidelying Rolling: Min assist Sidelying to sit: Mod assist Sit to sidelying: Max assist General bed mobility comments: total A to lift BLE back onto bed. Mod A to scoot back while on side Transfers Overall transfer level: Needs assistance Equipment used: Rolling walker (2 wheeled) Transfers: Sit to/from Stand Sit to Stand: From elevated surface, Max assist, +2 physical assistance General transfer comment: unable with +1. will need +2 Max A. May benefi tform suing [place] Ambulation/Gait Ambulation/Gait assistance: Mod assist, +2 safety/equipment Ambulation Distance (Feet): 2 Feet Assistive device: Rolling walker (2 wheeled) Gait Pattern/deviations: Step-to pattern, Shuffle, Trunk flexed, Wide base of support, Decreased stride length General Gait  Details: 2-3 steps forward and back with RW, R LE buckling, difficulty progressing and heavy UE use on walker; did not pivot to sit in recliner due to low seat and pt 6'7" with long legs and would not be able to stand from low seat    ADL: ADL Overall ADL's : Needs assistance/impaired Grooming: Set up, Sitting Upper Body Bathing: Set up, Sitting Lower Body Bathing: Maximal assistance, Sitting/lateral leans Upper Body Dressing : Minimal assistance Upper Body Dressing Details (indicate cue type and reason): for corsett Lower Body Dressing: Total assistance, Sitting/lateral leans Toilet Transfer Details (indicate cue type and reason): unable to safely attempt at this time. Will need drop arm BSC Toileting- Clothing Manipulation and Hygiene: Maximal assistance Functional mobility during ADLs: +2 for physical assistance (unable to stand pt at this time. Pt reports Max A +2`) General ADL Comments: Pt given back precaution sheet. Began to review precautions. Pt will need AE  Cognition: Cognition Overall Cognitive Status: Within Functional Limits for tasks assessed Orientation Level: Oriented X4 Cognition Arousal/Alertness: Awake/alert Behavior During Therapy: WFL for tasks assessed/performed Overall Cognitive Status: Within Functional Limits for tasks assessed  Blood pressure 133/61, pulse 62, temperature 98.7 F (37.1 C), temperature source Oral, resp. rate 20, SpO2 99 %. Physical Exam  Constitutional: He is oriented to person, place, and time. He appears well-developed.  HENT:  Head: Normocephalic.  Eyes: EOM are normal.  Neck: Normal range of motion. Neck supple. No thyromegaly present.  Cardiovascular: Normal rate, regular rhythm and normal heart sounds.   Respiratory: Effort  normal and breath sounds normal. No respiratory distress.  GI: Soft. Bowel sounds are normal. He exhibits no distension. There is no tenderness. There is no rebound.  Neurological: He is alert and oriented  to person, place, and time.  Skin:  Back incision is dressed with Hemovac drain in place  Motor strength is 5/5 bilateral deltoid, biceps, triceps, grip. 2 minus in the right hip flexor, knee extensor, hip adductor, ankle dorsiflexor and plantar flexor. 3 minus in the left hip flexor, knee extensor 4 minus and ankle dorsiflexor, plantar flexor Sensation reduced in the right L1-S1 dermatomal distribution, intact in the upper limbs, intact in the left lower extremity  Lab Results Last 24 Hours       Results for orders placed or performed during the hospital encounter of 12/01/16 (from the past 24 hour(s))  Glucose, capillary     Status: Abnormal   Collection Time: 12/02/16 11:32 AM  Result Value Ref Range   Glucose-Capillary 115 (H) 65 - 99 mg/dL      Imaging Results (Last 48 hours)  Dg Lumbar Spine 2-3 Views  Addendum Date: 12/01/2016   ADDENDUM REPORT: 12/01/2016 12:52 ADDENDUM: Additional imaging was at at after the initial interpretation. The second image demonstrates posterior surgical instruments directed at the L4-5 level. The third image (image labeled #2) demonstrates posterior surgical instruments extending from L2-3 to the L5 vertebral body. Electronically Signed   By: Rolm Baptise M.D.   On: 12/01/2016 12:52   Result Date: 12/01/2016 CLINICAL DATA:  L2-L5 lumbar decompression. EXAM: LUMBAR SPINE - 2-3 VIEW COMPARISON:  MRI 10/18/2016 FINDINGS: Posterior needles are directed along the superior aspect of the L2 and L4 spinous processes. IMPRESSION: Intraoperative localization as above. Electronically Signed: By: Rolm Baptise M.D. On: 12/01/2016 09:58   Mr Lumbar Spine Wo Contrast  Result Date: 12/01/2016 CLINICAL DATA:  Bilateral lower extremity weakness. Lumbar decompression earlier today. EXAM: MRI LUMBAR SPINE WITHOUT CONTRAST TECHNIQUE: Multiplanar, multisequence MR imaging of the lumbar spine was performed. No intravenous contrast was administered. COMPARISON:   10/18/2016 FINDINGS: The patient had difficulty tolerating the examination. The study is motion degraded, and there is decreased signal through the lower thoracic spine. The axial sequences do not cross reference to the sagittal sequences. Segmentation:  Standard. Alignment:  Unchanged.  No listhesis. Vertebrae: Preserved vertebral body heights without evidence of fracture or suspicious osseous lesion. Mild degenerative endplate edema at 075-GRM. Congenitally short pedicles. Conus medullaris: Extends to the L2 level. Sagittal T2 and STIR sequences demonstrate new abnormal hyperintense signal abnormality within the distal cord extending from the L1 level superiorly into the visualized lower thoracic spine. Axial T2 sequences are motion degraded, however some images clearly demonstrate T2 hyperintensity involving much of the cross-section of the cord, greatest centrally (for example series 3 images 5 and 17). Paraspinal and other soft tissues: Postoperative changes in the posterior soft tissues from L2-L5 with a surgical drain in place. Fluid is present in the surgical bed without a compressive epidural collection identified. T2 hyperintense lesions are again noted in left kidney, likely cysts. Disc levels: T10-11: Minimal disc bulging, congenitally short pedicles, and facet hypertrophy result in mild spinal stenosis. The neural foramina are not well evaluated. T11-12: Minimal disc bulging, congenitally short pedicles, and moderate facet and ligamentum flavum hypertrophy result in mild-to-moderate spinal stenosis and mild-to-moderate bilateral neural foraminal stenosis. T12-L1: Congenitally short pedicles and facet and ligamentum flavum hypertrophy result in mild spinal stenosis. No significant neural foraminal stenosis. L1-2: The possible left paracentral/subarticular  disc fragment described on the prior study is less well seen on the current examination due to study quality though is likely still present.  Circumferential disc bulging, congenitally short pedicles, and facet and ligamentum flavum hypertrophy result in mild spinal stenosis, mild right and moderate left lateral recess stenosis, and mild bilateral neural foraminal stenosis, similar to prior. L2-3: Interval laminectomies with mild residual spinal stenosis, improved from prior. Disc bulging and facet hypertrophy result in mild bilateral neural foraminal stenosis, unchanged. L3-4: Interval laminectomies without residual spinal stenosis. Disc bulging and facet hypertrophy result in mild right and mild-to-moderate left neural foraminal stenosis, unchanged. L4-5: Interval laminectomies without residual spinal stenosis. Disc bulging and facet hypertrophy result in mild-to-moderate bilateral neural foraminal stenosis, unchanged. L5-S1: Moderate disc space narrowing with disc desiccation. Interval left laminotomy. Circumferential disc bulging and facet hypertrophy result in moderate right and mild residual left lateral recess stenosis and moderate right greater than left neural foraminal stenosis. IMPRESSION: 1. New T2 edema throughout the distal spinal cord extending from L1 into the visualized lower thoracic spine. No high-grade compressive spinal stenosis is identified, and this may reflect ischemia from a vascular insult. 2. Interval L2-L5 posterior decompression as above. 3. 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 Findings discussed via telephone with Dr. Rolena Infante on 12/01/2016 at 6:30 p.m. Electronically Signed   By: Logan Bores M.D.   On: 12/01/2016 19:03     Assessment/Plan: Diagnosis: Thoracic myelopathy with paraparesis, right greater than left lower limb 1. Does the need for close, 24 hr/day medical supervision in concert with the patient's rehab needs make it unreasonable for this patient to be served in a less intensive setting? Yes 2. 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 No void in approximate 6 hours post-cath,  hypertension, diabetes. 3. Due to bladder management, bowel management, safety, skin/wound care, disease management, medication administration, pain management and patient education, does the patient require 24 hr/day rehab nursing? Yes 4. Does the patient require coordinated care of a physician, rehab nurse, PT (1-2 hrs/day, 5 days/week) and OT (1-2 hrs/day, 5 days/week) to address physical and functional deficits in the context of the above medical diagnosis(es)? Yes Addressing deficits in the following areas: balance, endurance, locomotion, strength, transferring, bowel/bladder control, bathing, dressing, feeding, grooming and toileting 5. Can the patient actively participate in an intensive therapy program of at least 3 hrs of therapy per day at least 5 days per week? Yes 6. The potential for patient to make measurable gains while on inpatient rehab is excellent 7. Anticipated functional outcomes upon discharge from inpatient rehab are modified independent and supervision  with PT, modified independent and supervision with OT, n/a with SLP. 8. Estimated rehab length of stay to reach the above functional goals is: 12-15d 9. Does the patient have adequate social supports and living environment to accommodate these discharge functional goals? Yes 10. Anticipated D/C setting: Home 11. Anticipated post D/C treatments: [facility] 12. Overall Rehab/Functional Prognosis: excellent  RECOMMENDATIONS: This patient's condition is appropriate for continued rehabilitative care in the following setting: CIR Patient has agreed to participate in recommended program. Yes Note that insurance prior authorization may be required for reimbursement for recommended care.  Comment: We will need to assess bladder, check post void residuals, may need to institute intermittent catheterization program   ANGIULLI,DANIEL J., PA-C 12/02/2016    Revision History  Routing History

## 2016-12-04 NOTE — H&P (Signed)
Physical Medicine and Rehabilitation Admission H&P    Chief complaint: Back pain  HPI: [name] is a 56 y.o. right handed male with history of hypertension, prediabetic. Per chart review and patient, patient lives with wife and daughter. Daughter has recently lost her job and can assist as needed. Wife works during the day. Patient has not worked for about a year due to increasing back pain. 2 level home with bedroom upstairs. Presented 12/01/2016 with low back pain radiating to the lower extremities. Imaging reviewed, showing severe lumbar stenosis with radiculopathy L2-3, 3-4 4-5. No change with conservative care. Underwent lumbar decompression L2-L5 12/01/2016 per Dr. Rolena Infante. Hospital course pain management. Back brace when out of bed applied sitting position. Bouts of constipation with laxative assistance. Physical therapy evaluation completed with recommendations of physical medicine rehabilitation consult. Patient was admitted for a comprehensive rehabilitation program.  Seen with Dr. Theda Sers and discussed.  Review of Systems  Constitutional: Negative for chills and fever.  HENT: Negative for hearing loss and tinnitus.   Eyes: Negative for blurred vision and double vision.  Respiratory: Negative for cough and shortness of breath.   Cardiovascular: Negative for chest pain, palpitations and leg swelling.  Gastrointestinal: Positive for constipation. Negative for nausea and vomiting.  Genitourinary: Positive for urgency. Negative for dysuria and hematuria.  Musculoskeletal: Positive for back pain, joint pain and myalgias.  Skin: Negative for rash.  Neurological: Positive for weakness. Negative for seizures.  All other systems reviewed and are negative.  Past Medical History:  Diagnosis Date  . Arthritis   . Diabetes mellitus without complication ([place]) Resolved 2014 after weight loss  . Hyperlipidemia Resolved 2014 after weight loss  . Hypertension   . Weight loss    Past  Surgical History:  Procedure Laterality Date  . LAPAROSCOPIC GASTRIC SLEEVE RESECTION    . LUMBAR LAMINECTOMY  12/01/2016   Lumbar decompression L2-5 LUMBAR LAMINECTOMY/DECOMPRESSION MICRODISCECTOMY 3 LEVELS (N/A)  . LUMBAR LAMINECTOMY/DECOMPRESSION MICRODISCECTOMY N/A 12/01/2016   Procedure: Lumbar decompression L2-5 LUMBAR LAMINECTOMY/DECOMPRESSION MICRODISCECTOMY 3 LEVELS;  Surgeon: Melina Schools, MD;  Location: [place];  Service: Orthopedics;  Laterality: N/A;   Family History  Problem Relation Age of Onset  . Diabetes Mother   . Hypertension Mother   . Diabetes Father   . Hypertension Father    Social History:  reports that he has never smoked. He has never used smokeless tobacco. He reports that he does not drink alcohol or use drugs. Allergies:  Allergies  Allergen Reactions  . No Known Allergies    Medications Prior to Admission  Medication Sig Dispense Refill  . aspirin EC 81 MG tablet Take 81 mg by mouth daily.    Marland Kitchen eplerenone (INSPRA) 50 MG tablet Take 25 mg by mouth daily.     . Multiple Vitamins-Minerals (MULTIVITAMIN WITH MINERALS) tablet Take 1 tablet by mouth daily.    . Olmesartan-Amlodipine-HCTZ (TRIBENZOR) 40-5-25 MG TABS Take 1 tablet by mouth daily.     . pregabalin (LYRICA) 50 MG capsule Take 50 mg by mouth daily.    . traMADol (ULTRAM) 50 MG tablet Take 50 mg by mouth every 6 (six) hours as needed for moderate pain.       Home: Home Living Family/patient expects to be discharged to:: Private residence Living Arrangements: Spouse/significant other, Children Available Help at Discharge: Family Type of Home: House Home Access: Stairs to enter Technical brewer of Steps: 2 Entrance Stairs-Rails: None Home Layout: Bed/bath upstairs, Two level Alternate Level Stairs-Number of  Steps: 12 Alternate Level Stairs-Rails: Right Bathroom Shower/Tub: Tub/shower unit Bathroom Toilet: Standard Bathroom Accessibility:  (unsure) Home Equipment: Walker - 2 wheels,  Shower seat Additional Comments: states he slid up/down stairs on his butt due to wekness; unable to get in tub for last few weeks due to weakness    Functional History: Prior Function Level of Independence: Needs assistance Gait / Transfers Assistance Needed: was getting some help couple of days prior to surgery; last two times negotiated stairs bumping on his bottom ADL's / Homemaking Assistance Needed: was unable to get into shower just prior to surgery, was doing sponge baths (family assisted as needed) Comments: was using walker couple of days prior to surgery  Functional Status:  Mobility: Bed Mobility Overal bed mobility: Needs Assistance Bed Mobility: Rolling, Sit to Sidelying Rolling: Min assist Sidelying to sit: Mod assist Sit to sidelying: Max assist General bed mobility comments: total A to lift BLE back onto bed. Mod A to scoot back while on side Transfers Overall transfer level: Needs assistance Equipment used: Rolling walker (2 wheeled) Transfers: Sit to/from Stand Sit to Stand: From elevated surface, Max assist, +2 physical assistance General transfer comment: unable with +1. will need +2 Max A. May benefi tform suing [place] Ambulation/Gait Ambulation/Gait assistance: Mod assist, +2 safety/equipment Ambulation Distance (Feet): 2 Feet Assistive device: Rolling walker (2 wheeled) Gait Pattern/deviations: Step-to pattern, Shuffle, Trunk flexed, Wide base of support, Decreased stride length General Gait Details: 2-3 steps forward and back with RW, R LE buckling, difficulty progressing and heavy UE use on walker; did not pivot to sit in recliner due to low seat and pt 6'7" with long legs and would not be able to stand from low seat    ADL: ADL Overall ADL's : Needs assistance/impaired Grooming: Set up, Sitting Upper Body Bathing: Set up, Sitting Lower Body Bathing: Maximal assistance, Sitting/lateral leans Upper Body Dressing : Minimal assistance Upper Body Dressing  Details (indicate cue type and reason): for corsett Lower Body Dressing: Total assistance, Sitting/lateral leans Toilet Transfer Details (indicate cue type and reason): unable to safely attempt at this time. Will need drop arm BSC Toileting- Clothing Manipulation and Hygiene: Maximal assistance Functional mobility during ADLs: +2 for physical assistance (unable to stand pt at this time. Pt reports Max A +2`) General ADL Comments: Pt given back precaution sheet. Began to review precautions. Pt will need AE  Cognition: Cognition Overall Cognitive Status: Within Functional Limits for tasks assessed Orientation Level: Oriented X4 Cognition Arousal/Alertness: Awake/alert Behavior During Therapy: WFL for tasks assessed/performed Overall Cognitive Status: Within Functional Limits for tasks assessed  Physical Exam: Blood pressure (!) 151/82, pulse 70, temperature 99 F (37.2 C), temperature source Oral, resp. rate 18, SpO2 100 %. Physical Exam  Vitals reviewed. Constitutional: He is oriented to person, place, and time. He appears well-developed and well-nourished.  HENT:  Head: Normocephalic.  Eyes: Conjunctivae and EOM are normal. Left eye exhibits no discharge.  Neck: Normal range of motion. Neck supple. No thyromegaly present.  Cardiovascular: Normal rate, regular rhythm and normal heart sounds.   Respiratory: Effort normal and breath sounds normal. No respiratory distress. He has no wheezes.  GI: Soft. Bowel sounds are normal. He exhibits no distension.  Musculoskeletal: He exhibits no edema or tenderness.  Neurological: He is alert and oriented to person, place, and time.  Sensation diminished to light touch RLE DTRs 1+ RLE Motor: B/l UE 5/5 proximal to distal LLE: 4-/5 HF, KE, 4/5 ADP/PF RLE: 2/5 HF, KE, 4-/5 ADP/PF  Skin: Skin is warm and dry.  Incision c/d/i  Psychiatric: He has a normal mood and affect. His behavior is normal.    Results for orders placed or performed during  the hospital encounter of 12/01/16 (from the past 48 hour(s))  Glucose, capillary     Status: None   Collection Time: 12/01/16  7:18 AM  Result Value Ref Range   Glucose-Capillary 78 65 - 99 mg/dL  Type and screen [place]     Status: None   Collection Time: 12/01/16  7:51 AM  Result Value Ref Range   ABO/RH(D) B POS    Antibody Screen NEG    Sample Expiration 12/04/2016   ABO/Rh     Status: None   Collection Time: 12/01/16  7:51 AM  Result Value Ref Range   ABO/RH(D) B POS   Glucose, capillary     Status: Abnormal   Collection Time: 12/02/16 11:32 AM  Result Value Ref Range   Glucose-Capillary 115 (H) 65 - 99 mg/dL   Dg Lumbar Spine 2-3 Views  Addendum Date: 12/01/2016   ADDENDUM REPORT: 12/01/2016 12:52 ADDENDUM: Additional imaging was at at after the initial interpretation. The second image demonstrates posterior surgical instruments directed at the L4-5 level. The third image (image labeled #2) demonstrates posterior surgical instruments extending from L2-3 to the L5 vertebral body. Electronically Signed   By: Rolm Baptise M.D.   On: 12/01/2016 12:52   Result Date: 12/01/2016 CLINICAL DATA:  L2-L5 lumbar decompression. EXAM: LUMBAR SPINE - 2-3 VIEW COMPARISON:  MRI 10/18/2016 FINDINGS: Posterior needles are directed along the superior aspect of the L2 and L4 spinous processes. IMPRESSION: Intraoperative localization as above. Electronically Signed: By: Rolm Baptise M.D. On: 12/01/2016 09:58   Mr Lumbar Spine Wo Contrast  Result Date: 12/01/2016 CLINICAL DATA:  Bilateral lower extremity weakness. Lumbar decompression earlier today. EXAM: MRI LUMBAR SPINE WITHOUT CONTRAST TECHNIQUE: Multiplanar, multisequence MR imaging of the lumbar spine was performed. No intravenous contrast was administered. COMPARISON:  10/18/2016 FINDINGS: The patient had difficulty tolerating the examination. The study is motion degraded, and there is decreased signal through the lower  thoracic spine. The axial sequences do not cross reference to the sagittal sequences. Segmentation:  Standard. Alignment:  Unchanged.  No listhesis. Vertebrae: Preserved vertebral body heights without evidence of fracture or suspicious osseous lesion. Mild degenerative endplate edema at 075-GRM. Congenitally short pedicles. Conus medullaris: Extends to the L2 level. Sagittal T2 and STIR sequences demonstrate new abnormal hyperintense signal abnormality within the distal cord extending from the L1 level superiorly into the visualized lower thoracic spine. Axial T2 sequences are motion degraded, however some images clearly demonstrate T2 hyperintensity involving much of the cross-section of the cord, greatest centrally (for example series 3 images 5 and 17). Paraspinal and other soft tissues: Postoperative changes in the posterior soft tissues from L2-L5 with a surgical drain in place. Fluid is present in the surgical bed without a compressive epidural collection identified. T2 hyperintense lesions are again noted in left kidney, likely cysts. Disc levels: T10-11: Minimal disc bulging, congenitally short pedicles, and facet hypertrophy result in mild spinal stenosis. The neural foramina are not well evaluated. T11-12: Minimal disc bulging, congenitally short pedicles, and moderate facet and ligamentum flavum hypertrophy result in mild-to-moderate spinal stenosis and mild-to-moderate bilateral neural foraminal stenosis. T12-L1: Congenitally short pedicles and facet and ligamentum flavum hypertrophy result in mild spinal stenosis. No significant neural foraminal stenosis. L1-2: The possible left paracentral/subarticular disc fragment described on the prior  study is less well seen on the current examination due to study quality though is likely still present. Circumferential disc bulging, congenitally short pedicles, and facet and ligamentum flavum hypertrophy result in mild spinal stenosis, mild right and moderate left  lateral recess stenosis, and mild bilateral neural foraminal stenosis, similar to prior. L2-3: Interval laminectomies with mild residual spinal stenosis, improved from prior. Disc bulging and facet hypertrophy result in mild bilateral neural foraminal stenosis, unchanged. L3-4: Interval laminectomies without residual spinal stenosis. Disc bulging and facet hypertrophy result in mild right and mild-to-moderate left neural foraminal stenosis, unchanged. L4-5: Interval laminectomies without residual spinal stenosis. Disc bulging and facet hypertrophy result in mild-to-moderate bilateral neural foraminal stenosis, unchanged. L5-S1: Moderate disc space narrowing with disc desiccation. Interval left laminotomy. Circumferential disc bulging and facet hypertrophy result in moderate right and mild residual left lateral recess stenosis and moderate right greater than left neural foraminal stenosis. IMPRESSION: 1. New T2 edema throughout the distal spinal cord extending from L1 into the visualized lower thoracic spine. No high-grade compressive spinal stenosis is identified, and this may reflect ischemia from a vascular insult. 2. Interval L2-L5 posterior decompression as above. 3. 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 Findings discussed via telephone with Dr. Rolena Infante on 12/01/2016 at 6:30 p.m. Electronically Signed   By: Logan Bores M.D.   On: 12/01/2016 19:03    Medical Problem List and Plan: 1.  Decreased functional mobility secondary to lumbar stenosis with radiculopathy status post decompression L-2-L5. Back brace when out of bed 2.  DVT Prophylaxis/Anticoagulation: SCDs. Check vascular study 3. Pain Management: Robaxin and oxycodone as needed 4. Mood: Provide emotional support 5. Neuropsych: This patient is capable of making decisions on his own behalf. 6. Skin/Wound Care: Routine skin checks 7. Fluids/Electrolytes/Nutrition: Routine I&O with follow-up chemistries 8.  Hypertension. Aldactone 25 mg daily, HCTZ 25 mg daily, Avapro 300 mg daily, Norvasc 5 mg daily. Monitor with increased mobility 9. Prediabetic. Hemoglobin A1c 5.3. Blood sugar checks discontinued 10. Constipation. Laxative assistance  Post Admission Physician Evaluation: 1. Preadmission assessment reviewed and changes made below. 2. Functional deficits secondary  to to lumbar stenosis with radiculopathy status post decompression L-2-L5. 3. Patient is admitted to receive collaborative, interdisciplinary care between the physiatrist, rehab nursing staff, and therapy team. 4. Patient's level of medical complexity and substantial therapy needs in context of that medical necessity cannot be provided at a lesser intensity of care such as a SNF. 5. Patient has experienced substantial functional loss from his/her baseline which was documented above under the "Functional History" and "Functional Status" headings.  Judging by the patient's diagnosis, physical exam, and functional history, the patient has potential for functional progress which will result in measurable gains while on inpatient rehab.  These gains will be of substantial and practical use upon discharge  in facilitating mobility and self-care at the household level. 6. Physiatrist will provide 24 hour management of medical needs as well as oversight of the therapy plan/treatment and provide guidance as appropriate regarding the interaction of the two. 7. The Preadmission Screening has been reviewed and patient status is unchanged unless otherwise stated above. 8. 24 hour rehab nursing will assist with bowel management, safety, skin/wound care, disease management, pain management and patient education  and help integrate therapy concepts, techniques,education, etc. 9. PT will assess and treat for/with: Lower extremity strength, range of motion, stamina, balance, functional mobility, safety, adaptive techniques and equipment, woundcare, coping  skills, pain control, education.   Goals are: Mod  I. 10. OT will assess and treat for/with: ADL's, functional mobility, safety, upper extremity strength, adaptive techniques and equipment, wound mgt, ego support, and community reintegration.   Goals are: Supervision/Mod I. Therapy may not proceed with showering this patient. 11. Case Management and Social Worker will assess and treat for psychological issues and discharge planning. 12. Team conference will be held weekly to assess progress toward goals and to determine barriers to discharge. 13. Patient will receive at least 3 hours of therapy per day at least 5 days per week. 14. ELOS: 14-18 days.       15. Prognosis:  good  Delice Lesch, MD, Mellody Drown Cathlyn Parsons., PA-C 12/03/2016

## 2016-12-04 NOTE — Progress Notes (Signed)
Patient was discharged to inpatient rehab room 4MW10. Report was called to receiving nurse. Family was also updated.

## 2016-12-04 NOTE — Progress Notes (Signed)
Alex Gong, RN Rehab Admission Coordinator Signed Physical Medicine and Rehabilitation  PMR Pre-admission Date of Service: 12/03/2016 4:07 PM  Related encounter: Admission (Discharged) from 12/01/2016 in [facility]       [] Hide copied text PMR Admission Coordinator Pre-Admission Assessment  Patient: [name] is an 56 y.o., male MRN: [id number] DOB: September 06, 1961 Height:   Weight:                                                                                                                                                    Insurance Information HMO:     PPO: yes     PCP:      IPA:      80/20:      OTHER:  PRIMARYChristella [name]    Policy#: [id number]     Subscriber: pt CM Name: [name]      Phone#: [phone] ext [phone]     Fax#: EPIC access Pre-Cert#: [id number] approved for 7 days      Employer:  Benefits:  Phone #: [phone]     Name: 12/03/16 Eff. Date: 11/22/16     Deduct: $1500       Out of Pocket Max: $3500      Life Max: none CIR: 80%      SNF: 80% 60 days Outpatient: $40 co pay per visit     Co-Pay: 20 visits combined Home Health: 80%      Co-Pay: 60 visits combined DME: 80%     Co-Pay: 20% Providers: in network  SECONDARY: none        Medicaid Application Date:       Case Manager:  Disability Application Date:       Case Worker:   Emergency Contact Information        Contact Information    Name Relation Home Work Mobile   [name] Spouse   [phone]     Current Medical History  Patient Admitting Diagnosis:  Thoracic myelopathy with paraparesis, right greater than left LE  History of Present Illness:: Alex F Diggsis a 56 y.o.right handed malewith history of hypertension, prediabetic .Presented01/08/2017 with low back pain radiating to the lower extremities. X-rays and imaging revealed severe lumbar stenosis with radiculopathy L2-3, 3-4 4-5. No change with conservative care. Underwent lumbar  decompression L2-L5 12/01/2016 per Dr. Rolena Infante. Hospital course pain management. Back brace when out of bed applied sitting position. Bouts of constipation with laxative assistance.   Past Medical History      Past Medical History:  Diagnosis Date  . Arthritis   . Diabetes mellitus without complication ([place]) Resolved 2014 after weight loss  . Hyperlipidemia Resolved 2014 after weight loss  . Hypertension   . Weight loss     Family History  family history includes Diabetes in his father and mother;  Hypertension in his father and mother.  Prior Rehab/Hospitalizations:  Has the patient had major surgery during 100 days prior to admission? No  Current Medications   Current Facility-Administered Medications:  .  0.9 %  sodium chloride infusion, 250 mL, Intravenous, Continuous, Melina Schools, MD .  acetaminophen (TYLENOL) tablet 650 mg, 650 mg, Oral, Q4H PRN **OR** acetaminophen (TYLENOL) suppository 650 mg, 650 mg, Rectal, Q4H PRN, Melina Schools, MD .  irbesartan (AVAPRO) tablet 300 mg, 300 mg, Oral, Daily, 300 mg at 12/03/16 0933 **AND** amLODipine (NORVASC) tablet 5 mg, 5 mg, Oral, Daily, Melina Schools, MD, 5 mg at 12/03/16 0933 .  hydrochlorothiazide (HYDRODIURIL) tablet 25 mg, 25 mg, Oral, Daily, Melina Schools, MD, 25 mg at 12/02/16 1751 .  lactated ringers infusion, , Intravenous, Continuous, Melina Schools, MD, Stopped at 12/02/16 [phone] .  [START ON 12/04/2016] magnesium citrate solution 0.5 Bottle, 0.5 Bottle, Oral, Once, Melina Schools, MD .  menthol-cetylpyridinium (CEPACOL) lozenge 3 mg, 1 lozenge, Oral, PRN **OR** phenol (CHLORASEPTIC) mouth spray 1 spray, 1 spray, Mouth/Throat, PRN, Melina Schools, MD .  methocarbamol (ROBAXIN) tablet 500 mg, 500 mg, Oral, Q6H PRN **OR** methocarbamol (ROBAXIN) 500 mg in dextrose 5 % 50 mL IVPB, 500 mg, Intravenous, Q6H PRN, Melina Schools, MD .  naphazoline-glycerin (CLEAR EYES) ophth solution 1-2 drop, 1-2 drop, Both Eyes, Q12H PRN, Melina Schools, MD .  ondansetron (ZOFRAN) injection 4 mg, 4 mg, Intravenous, Q4H PRN, Melina Schools, MD .  oxyCODONE-acetaminophen (PERCOCET/ROXICET) 5-325 MG per tablet 1-2 tablet, 1-2 tablet, Oral, Q4H PRN, Melina Schools, MD .  polyethylene glycol (MIRALAX / GLYCOLAX) packet 17 g, 17 g, Oral, Daily PRN, Melina Schools, MD .  sodium chloride flush (NS) 0.9 % injection 3 mL, 3 mL, Intravenous, Q12H, Melina Schools, MD, 3 mL at 12/03/16 0934 .  sodium chloride flush (NS) 0.9 % injection 3 mL, 3 mL, Intravenous, PRN, Melina Schools, MD .  Derrill Memo ON 12/04/2016] sodium phosphate (FLEET) 7-19 GM/118ML enema 1 enema, 1 enema, Rectal, Once, Melina Schools, MD .  spironolactone (ALDACTONE) tablet 25 mg, 25 mg, Oral, Daily, Melina Schools, MD, 25 mg at 12/03/16 [id number]  Patients Current Diet: Diet regular Room service appropriate? Yes; Fluid consistency: Thin  Precautions / Restrictions Precautions Precautions: Fall, Back Precaution Booklet Issued: Yes (comment) Precaution Comments: pt education on precautions prior to tx.  Spinal Brace: Lumbar corset, Applied in sitting position Restrictions Weight Bearing Restrictions: No   Has the patient had 2 or more falls or a fall with injury in the past year?No  Prior Activity Level Community (5-7x/wk): working until [employer] but was I. uses RW 3 days prior to admit due to weakness. Was driving  Development worker, international aid / [place] Devices/Equipment: Environmental consultant (specify type), Contact lenses Home Equipment: Walker - 2 wheels, Shower seat  Prior Device Use: Indicate devices/aids used by the patient prior to current illness, exacerbation or injury? None of the above  Prior Functional Level Prior Function Level of Independence: Needs assistance Gait / Transfers Assistance Needed: was getting some help couple of days prior to surgery; last two times negotiated stairs bumping on his bottom ADL's / Homemaking Assistance Needed: was unable to get into  shower just prior to surgery, was doing sponge baths Comments: was using walker couple of days prior to surgery  Self Care: Did the patient need help bathing, dressing, using the toilet or eating?  Independent  Indoor Mobility: Did the patient need assistance with walking from room to room (with or without device)?  Independent  Stairs: Did the patient need assistance with internal or external stairs (with or without device)? Independent  Functional Cognition: Did the patient need help planning regular tasks such as shopping or remembering to take medications? Independent  Current Functional Level Cognition Overall Cognitive Status: Within Functional Limits for tasks assessed Orientation Level: Oriented X4    Extremity Assessment (includes Sensation/Coordination) Upper Extremity Assessment: Overall WFL for tasks assessed  Lower Extremity Assessment: Defer to PT evaluation RLE Deficits / Details: AAROM limited knee flexion with h/o arthritis, strength hip flexion 2/5, knee extension 3-/5, ankle DF 3-/5 (poor control/coordiantion of LE) RLE Sensation: decreased light touch (proprioception not specifically testing, but difficulty placing for tranfers) LLE Deficits / Details: AAROM limited knee flexion about 70 in supine (more in sitting), strength hip flexion 2+/5, knee extension 4/5, ankle DF 4-/5 LLE Sensation: decreased light touch   ADLs Overall ADL's : Needs assistance/impaired Grooming: Set up, Supervision/safety Grooming Details (indicate cue type and reason): completed oral care/washing face at sink sitting on stedy. Requried 1 hand to support trunk at all times Upper Body Bathing: Set up, Sitting Lower Body Bathing: Maximal assistance, Sitting/lateral leans Upper Body Dressing : Minimal assistance Upper Body Dressing Details (indicate cue type and reason): for corsett Lower Body Dressing: Total assistance, Sitting/lateral leans Toilet Transfer: +2 for safety/equipment,  Moderate assistance (sit - stand using Stedy and bed pad; simulated) Toilet Transfer Details (indicate cue type and reason): unable to safely attempt at this time. Will need drop arm BSC Toileting- Clothing Manipulation and Hygiene: Maximal assistance Toileting - Clothing Manipulation Details (indicate cue type and reason): using urinal with set up Functional mobility during ADLs: +2 for physical assistance, Moderate assistance (from elevated surface) General ADL Comments: Pt given back precaution sheet. Began to review precautions. Pt will need AE   Mobility Overal bed mobility: Needs Assistance Bed Mobility: Rolling, Sidelying to Sit Rolling: Min guard Sidelying to sit: +2 for physical assistance, Max assist Sit to sidelying: Max assist General bed mobility comments: Assist to lower BLE off bed and Assist to elevate trunk   Transfers Overall transfer level: Needs assistance Equipment used:  Charlaine Dalton) Transfer via Lift Equipment: Stedy (bariatric) Transfers: Sit to/from Stand Sit to Stand: Mod assist, +2 physical assistance, From elevated surface General transfer comment: Pt sit 4 sit to stands with stedy: 1 from elevated bed and 1 from lower seated height of bed and 2 from stedy plates. Pt required mod A +2 using chux pad for all sit to stand transfers due to lack of quad control and trunk weakness. With final eccentric lower to chair, pt presented with B spacticity; PTA used tactile cuing for feet placement.     Ambulation / Gait / Stairs / Wheelchair Mobility Ambulation/Gait Ambulation/Gait assistance: Mod assist, +2 safety/equipment Ambulation Distance (Feet): 2 Feet Assistive device: Rolling walker (2 wheeled) Gait Pattern/deviations: Step-to pattern, Shuffle, Trunk flexed, Wide base of support, Decreased stride length General Gait Details: 2-3 steps forward and back with RW, R LE buckling, difficulty progressing and heavy UE use on walker; did not pivot to sit in recliner due to low seat  and pt 6'7" with long legs and would not be able to stand from low seat   Posture / Balance Dynamic Sitting Balance Sitting balance - Comments: Unable to complete funcitonal activity without support from 1UE due to decreased trunk control Balance Overall balance assessment: Needs assistance Sitting-balance support: Single extremity supported Sitting balance-Leahy Scale: Poor Sitting balance - Comments: Unable to complete funcitonal  activity without support from 1UE due to decreased trunk control Standing balance support: Bilateral upper extremity supported Standing balance-Leahy Scale: Zero Standing balance comment: heavy UE reilance and mod support during transfers for safety.    Special needs/care consideration BiPAP/CPAP  N/a CPM  N/a Continuous Drip IV  N/a Dialysis  N/a Life Vest  N/a Oxygen  N/a Special Bed  N/a Trach Size  N/a Wound Vac (area)  N/a Skin surgical incision Bowel mgmt: LBM 1/10 but received Mg Citrate on 12/03/16 Bladder mgmt: continent Diabetic mgmt. pre diabetic   Previous Home Environment Living Arrangements:  (57 yo ans 12 yo daughters in the home also)  Lives With: Spouse, Daughter Available Help at Discharge: Family, Available 24 hours/day Type of Home: House Home Layout: Bed/bath upstairs, Two level Alternate Level Stairs-Rails: Right Alternate Level Stairs-Number of Steps: 12 Home Access: Stairs to enter Entrance Stairs-Rails: None Entrance Stairs-Number of Steps: 2 Bathroom Shower/Tub: Tub/shower unit, Architectural technologist: Standard Bathroom Accessibility: Yes How Accessible: Accessible via walker [place]: No Additional Comments: states he slid up/down stairs on his butt due to wekness; unable to get in tub for last few weeks due to weakness   Discharge Living Setting Plans for Discharge Living Setting: Patient's home, Lives with (comment) (wife and two daughter 58 and 3 yo) Type of Home at Discharge: House Discharge Home  Layout: 1/2 bath on main level, Two level, Bed/bath upstairs Alternate Level Stairs-Rails: Right Alternate Level Stairs-Number of Steps: flight Discharge Home Access: Stairs to enter Entrance Stairs-Rails: None Entrance Stairs-Number of Steps: 2 Discharge Bathroom Shower/Tub: Tub/shower unit, Curtain Discharge Bathroom Toilet: Standard Discharge Bathroom Accessibility: Yes How Accessible: Accessible via walker Does the patient have any problems obtaining your medications?: No  Social/Family/Support Systems Patient Roles: Spouse, Parent, Other (Comment) (employee) Contact Information: Mateo Flow, wife Anticipated Caregiver: wife and two daughters Anticipated Caregiver's Contact Information: see above Ability/Limitations of Caregiver: wife works, 87 yo daughter unemployed; 62 yo daughte in school Caregiver Availability: 24/7 Discharge Plan Discussed with Primary Caregiver: Yes Is Caregiver In Agreement with Plan?: Yes Does Caregiver/Family have Issues with Lodging/Transportation while Pt is in Rehab?: No  Goals/Additional Needs Patient/Family Goal for Rehab: Mod I to supervision with PT and OT Expected length of stay: ELOS 12-15 days Pt/Family Agrees to Admission and willing to participate: Yes Program Orientation Provided & Reviewed with Pt/Caregiver Including Roles  & Responsibilities: Yes  Decrease burden of Care through IP rehab admission: n/a  Possible need for SNF placement upon discharge: not anticipated  Patient Condition: [name] conditions remains as documented on consult 12/02/2016 when Rehab MD felt pt would benefit from the coordinated team approach offered for an acute inpt rehab admission. Pt overall mod assist with functional transfers. I will admit pt tomorrow, 12/04/16 when bed is available.    Preadmission Screen Completed By:  Cleatrice Burke, 12/03/2016 4:07 PM ______________________________________________________________________   Discussed status with  Dr. Naaman Plummer on 12/03/2016 at  1616 and received telephone approval for admission today.  Admission Coordinator:  Cleatrice Burke, time [id number] Date 12/03/2016       Cosigned by: Meredith Staggers, MD at 12/03/2016 9:19 PM  Revision History

## 2016-12-04 NOTE — Progress Notes (Signed)
Subjective: 3 Days Post-Op Procedure(s) (LRB): Lumbar decompression L2-5 LUMBAR LAMINECTOMY/DECOMPRESSION MICRODISCECTOMY 3 LEVELS (N/A) Patient reports pain as 3 on 0-10 scale.   Seems comfortable. Objective: Vital signs in last 24 hours: Temp:  [99 F (37.2 C)-99.7 F (37.6 C)] 99 F (37.2 C) (01/13 0907) Pulse Rate:  [64-87] 75 (01/13 0907) Resp:  [16-20] 16 (01/13 0907) BP: (113-158)/(57-89) 113/57 (01/13 0907) SpO2:  [98 %-100 %] 99 % (01/13 0907)  Intake/Output from previous day: 01/12 0701 - 01/13 0700 In: 635 [P.O.:560] Out: 1740 [Urine:1740] Intake/Output this shift: No intake/output data recorded.  No results for input(s): HGB in the last 72 hours. No results for input(s): WBC, RBC, HCT, PLT in the last 72 hours. No results for input(s): NA, K, CL, CO2, BUN, CREATININE, GLUCOSE, CALCIUM in the last 72 hours. No results for input(s): LABPT, INR in the last 72 hours.  Intact pulses distally Dorsiflexion/Plantar flexion intact Incision: dressing C/D/I and no drainage  Assessment/Plan: 3 Days Post-Op Procedure(s) (LRB): Lumbar decompression L2-5 LUMBAR LAMINECTOMY/DECOMPRESSION MICRODISCECTOMY 3 LEVELS (N/A) Up with therapy Patient to in pt rehab today. Discussed with Dr Posey Pronto  Pt understands and pleased with plan. Ikran Patman ANDREW 12/04/2016, 11:26 AM

## 2016-12-05 ENCOUNTER — Inpatient Hospital Stay (HOSPITAL_COMMUNITY): Payer: Managed Care, Other (non HMO) | Admitting: Occupational Therapy

## 2016-12-05 ENCOUNTER — Inpatient Hospital Stay (HOSPITAL_COMMUNITY): Payer: Managed Care, Other (non HMO)

## 2016-12-05 ENCOUNTER — Inpatient Hospital Stay (HOSPITAL_COMMUNITY): Payer: Managed Care, Other (non HMO) | Admitting: Physical Therapy

## 2016-12-05 DIAGNOSIS — I169 Hypertensive crisis, unspecified: Secondary | ICD-10-CM

## 2016-12-05 DIAGNOSIS — I10 Essential (primary) hypertension: Secondary | ICD-10-CM

## 2016-12-05 DIAGNOSIS — M7989 Other specified soft tissue disorders: Secondary | ICD-10-CM

## 2016-12-05 DIAGNOSIS — K5903 Drug induced constipation: Secondary | ICD-10-CM

## 2016-12-05 DIAGNOSIS — M5416 Radiculopathy, lumbar region: Secondary | ICD-10-CM

## 2016-12-05 DIAGNOSIS — R7303 Prediabetes: Secondary | ICD-10-CM

## 2016-12-05 NOTE — Progress Notes (Signed)
[facility] PHYSICAL MEDICINE & REHABILITATION     PROGRESS NOTE  Subjective/Complaints:  Pt seen laying in bed this AM.  He states he had a good first night and is ready for therapies.  He notes a warm sensation in his legs that just started.   ROS: +LE dysesthesias. Denies CP, SOB, N/V/D  Objective: Vital Signs: Blood pressure (!) 185/85, pulse 66, temperature 98.4 F (36.9 C), temperature source Oral, resp. rate 20, height 6\' 7"  (2.007 m), weight 126.5 kg (278 lb 12.8 oz), SpO2 100 %. No results found. No results for input(s): WBC, HGB, HCT, PLT in the last 72 hours. No results for input(s): NA, K, CL, GLUCOSE, BUN, CREATININE, CALCIUM in the last 72 hours.  Invalid input(s): CO CBG (last 3)   Recent Labs  12/02/16 1132  GLUCAP 115*    Wt Readings from Last 3 Encounters:  12/04/16 126.5 kg (278 lb 12.8 oz)  11/25/16 122.8 kg (270 lb 11.2 oz)  11/03/16 117 kg (258 lb)    Physical Exam:  BP (!) 185/85 (BP Location: Left Arm)   Pulse 66   Temp 98.4 F (36.9 C) (Oral)   Resp 20   Ht 6\' 7"  (2.007 m)   Wt 126.5 kg (278 lb 12.8 oz)   SpO2 100%   BMI 31.41 kg/m  Constitutional: He appears well-developed and well-nourished. NAD. HENT: Normocephalic. Atraumatic Eyes: EOM are normal. No discharge.  Cardiovascular: Normal rate, regular rhythm.  No JVD. Respiratory: Effort normal and breath sounds normal.  GI: Soft. Bowel sounds are normal.  Musculoskeletal: He exhibits no edema or tenderness.  Neurological: He is alert and oriented.  Sensation diminished to light touch RLE Motor: B/l UE 5/5 proximal to distal LLE: 4-/5 HF, KE, 4/5 ADP/PF RLE: 2/5 HF, KE, 4-/5 ADP/PF  Skin: Skin is warm and dry.  Incision c/d/i  Psychiatric: He has a normal mood and affect. His behavior is normal.   Assessment/Plan: 1. Functional deficits secondary to lumbar stenosis with radiculopathy status post decompression L-2-L5 which require 3+ hours per day of interdisciplinary therapy in a  comprehensive inpatient rehab setting. Physiatrist is providing close team supervision and 24 hour management of active medical problems listed below. Physiatrist and rehab team continue to assess barriers to discharge/monitor patient progress toward functional and medical goals.  Function:  Bathing Bathing position      Bathing parts      Bathing assist        Upper Body Dressing/Undressing Upper body dressing                    Upper body assist        Lower Body Dressing/Undressing Lower body dressing                                  Lower body assist        Toileting Toileting          Toileting assist     Transfers Chair/bed transfer             Locomotion Ambulation           Wheelchair          Cognition Comprehension Comprehension assist level: Follows complex conversation/direction with no assist  Expression Expression assist level: Expresses complex ideas: With no assist  Social Interaction Social Interaction assist level: Interacts appropriately with others - No medications needed.  Problem  Solving Problem solving assist level: Solves complex problems: Recognizes & self-corrects  Memory Memory assist level: Complete Independence: No helper    Medical Problem List and Plan: 1.  Decreased functional mobility secondary to lumbar stenosis with radiculopathy status post decompression L-2-L5. Back brace when out of bed  Begin CIR 2.  DVT Prophylaxis/Anticoagulation: SCDs.   Vascular study pending 3. Pain Management: Robaxin and oxycodone as needed 4. Mood: Provide emotional support 5. Neuropsych: This patient is capable of making decisions on his own behalf. 6. Skin/Wound Care: Routine skin checks. Monitor back incision. 7. Fluids/Electrolytes/Nutrition: Routine I&Os with follow-up chemistries 8. Hypertension. Aldactone 25 mg daily, HCTZ 25 mg daily, Avapro 300 mg daily, Norvasc 5 mg daily.   Extremely labile with  hypertensive crisis overnight.  Will cont to monitor with increased activity and consider further adjustments. 9. Prediabetic. Hemoglobin A1c 5.3. Blood sugar checks discontinued 10. Constipation. Laxative assistance   LOS (Days) 1 A FACE TO FACE EVALUATION WAS PERFORMED  Yenny Kosa Lorie Phenix 12/05/2016 7:34 AM

## 2016-12-05 NOTE — Evaluation (Signed)
Occupational Therapy Assessment and Plan  Patient Details  Name: [name] MRN: [id number] Date of Birth: Nov 27, 1960  OT Diagnosis: abnormal posture, lumbago (low back pain) and muscle weakness (generalized) Rehab Potential: Rehab Potential (ACUTE ONLY): Good ELOS: 3 weeks    Today's Date: 12/05/2016 OT Individual Time: 1440-1540 and 2979-8921 OT Individual Time Calculation (min): 60 min and 81 min     Problem List:  Patient Active Problem List   Diagnosis Date Noted  . Hypertensive crisis   . Radiculopathy 12/04/2016  . Surgery, elective   . Weakness of both legs   . Post-operative pain   . Benign essential HTN   . Prediabetes   . Constipation due to pain medication   . Back pain 12/01/2016  . Bradycardia 07/17/2014  . Essential hypertension 07/17/2014  . Syncope 07/15/2014    Past Medical History:  Past Medical History:  Diagnosis Date  . Arthritis   . Diabetes mellitus without complication ([place]) Resolved 2014 after weight loss  . Hyperlipidemia Resolved 2014 after weight loss  . Hypertension   . Weight loss    Past Surgical History:  Past Surgical History:  Procedure Laterality Date  . LAPAROSCOPIC GASTRIC SLEEVE RESECTION    . LUMBAR LAMINECTOMY  12/01/2016   Lumbar decompression L2-5 LUMBAR LAMINECTOMY/DECOMPRESSION MICRODISCECTOMY 3 LEVELS (N/A)  . LUMBAR LAMINECTOMY/DECOMPRESSION MICRODISCECTOMY N/A 12/01/2016   Procedure: Lumbar decompression L2-5 LUMBAR LAMINECTOMY/DECOMPRESSION MICRODISCECTOMY 3 LEVELS;  Surgeon: Alex Schools, MD;  Location: [place];  Service: Orthopedics;  Laterality: N/A;    Assessment & Plan Clinical Impression: Alex Schlitt Diggsis a 56 y.o.right handed malewith history of hypertension, prediabetic.Per chart review and patient, patient lives with wife and daughter. Daughter has recently lost her job and can assist as needed. Wife works during the day. Patient has not worked for about a year due to increasing back pain. 2 level home with  bedroom upstairs.Presented01/08/2017 with low back pain radiating to the lower extremities. Imaging reviewed, showing severe lumbar stenosis with radiculopathy L2-3, 3-4 4-5. No change with conservative care. Underwent lumbar decompression L2-L5 12/01/2016 per Dr. Rolena [name]. Hospital course pain management. Back brace when out of bed applied sitting position. Bouts of constipation with laxative assistance. Physical therapy evaluation completed with recommendations of physical medicine rehabilitation consult. Patient was admitted for a comprehensive rehabilitation program.  Seen with Dr. Theda [name] and discussed..    Patient currently requires total with basic self-care skills secondary to muscle weakness, decreased cardiorespiratoy endurance, decreased coordination and decreased balance strategies and difficulty maintaining precautions.  Prior to hospitalization, patient could complete BADLs with supervision.  Patient will benefit from skilled intervention to increase independence with basic self-care skills prior to discharge home with care partner.  Anticipate patient will require 24 hour supervision and follow up home health.  OT - End of Session Endurance Deficit: Yes OT Assessment Rehab Potential (ACUTE ONLY): Good Barriers to Discharge: Inaccessible home environment OT Patient demonstrates impairments in the following area(s): Balance;Safety;Sensory;Endurance;Motor OT Basic ADL's Functional Problem(s): Grooming;Bathing;Dressing;Toileting OT Transfers Functional Problem(s): Toilet;Tub/Shower OT Plan OT Intensity: Minimum of 1-2 x/day, 45 to 90 minutes OT Frequency: 5 out of 7 days OT Duration/Estimated Length of Stay: 3 weeks  OT Treatment/Interventions: Discharge planning;Self Care/advanced ADL retraining;Therapeutic Activities;Functional mobility training;Patient/family education;Therapeutic Exercise;DME/adaptive equipment instruction;Psychosocial support;UE/LE Strength taining/ROM OT Self  Feeding Anticipated Outcome(s): N/A OT Basic Self-Care Anticipated Outcome(s): Supervision-Min A OT Toileting Anticipated Outcome(s): Supervision  OT Bathroom Transfers Anticipated Outcome(s): Supervision  OT Recommendation Patient destination: Home Follow Up Recommendations: 24 hour supervision/assistance Equipment  Recommended: To be determined   Skilled Therapeutic Intervention Skilled OT session completed with focus on initial evaluation, education on OT role/POC, precaution adherence, and establishment of client-centered goals. Pt was sitting in recliner at time of arrival wearing back brace, agreeable to tx. 3/3 recall of back precautions. Pt transferred to EOB with Stedy and 2 assist. UB ADLs completed at EOB with pt requiring unilateral support to maintain balance. LB ADLs completed bedlevel with overall Max A with pt instructed on logroll technique with 2 helpers required for safety. Afterwards pt transferred back to w/c in manner as written above and completed grooming tasks at sink, UE coordination [facility] for task demands. Afterwards pt was set up for lunch and left with all needs within reach.   2nd Session 1:1 Tx (60 min) Skilled OT session completed with focus on AE training, family (spouse [name]) education, and toileting. Pt provided with new drop arm commode for bathroom, elevated at max height to accommodate pt and Stedy. Toilet aide training initiated with education to continue at later session. Pt reported feeling too fatigued to complete toilet transfer today and opted to trial tomorrow with therapy. Pt completed supine<sit with Mod A and extra time. Once at EOB, pt was provided with reacher, wide sock aide, and red shoe horn. Pt practiced threading LEs into pants, donning socks, and donning left shoe. Pt required instruction and extra time to complete due to LE weakness/sensation deficits. Discussed home set up with pt and spouse, and DME accessibility/needs. At end of tx pt was  transferred to recliner with [facility] and 2 helpers. All needs within reach at time of departure.   OT Evaluation Precautions/Restrictions  Precautions Precautions: Back;Fall Required Braces or Orthoses: Spinal Brace Spinal Brace: Lumbar corset;Applied in standing position Restrictions Weight Bearing Restrictions: No General Chart Reviewed: Yes Family/Caregiver Present: Yes   Home Living/Prior [place] expects to be discharged to:: Private residence Living Arrangements: Spouse/significant other Available Help at Discharge: Family, Available 24 hours/day Type of Home: House Home Access: Stairs to enter [employer] of Steps: 2 Entrance Stairs-Rails: None Home Layout: Bed/bath upstairs, Two level Alternate Level Stairs-Number of Steps: 12 Alternate Level Stairs-Rails: Right Bathroom Shower/Tub: Chiropodist: Standard Bathroom Accessibility: Yes Additional Comments: pt used RW x 2 months prior to surgery  Lives With: Spouse, Daughter IADL History Homemaking Responsibilities: Yes Meal Prep Responsibility: Secondary Laundry Responsibility: No Cleaning Responsibility: No Bill Paying/Finance Responsibility: No Shopping Responsibility: No Child Care Responsibility: No Occupation: Unemployed 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  Leisure and Hobbies: Chief Strategy Officer  Prior Function Level of Independence: Independent with basic ADLs, Independent with gait  Able to Take Stairs?: Yes Driving: Yes 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, lots of standing and walking ADL ADL ADL Comments: Please see functional navigator for ADL status Vision/Perception  Vision- History Baseline Vision/History: Wears glasses Wears Glasses: At all times (Secondary to not having contact lenses) Patient Visual Report: No change from baseline Vision- Assessment Vision Assessment?: No  apparent visual deficits  Cognition Overall Cognitive Status: Within Functional Limits for tasks assessed Arousal/Alertness: Awake/alert Orientation Level: Person;Place;Situation Person: Oriented Place: Oriented Situation: Oriented Year: 2018 Month: January Day of Week: Correct Memory: Appears intact Immediate Memory Recall: Sock;Blue;Bed Memory Recall: Sock;Blue;Bed Memory Recall Sock: Without Cue Memory Recall Blue: Without Cue Memory Recall Bed: Without Cue Attention: Focused Focused Attention: Appears intact Awareness: Appears intact Problem Solving: Impaired Safety/Judgment: Appears intact Sensation Sensation Light Touch: Impaired Detail  Light Touch Impaired Details: Impaired LLE;Absent RLE Stereognosis: Not tested Hot/Cold: Not tested Proprioception Impaired Details: Impaired RUE;Absent LUE Coordination Finger Nose Finger Test: Slight overshooting Bilateral UEs Motor  Motor Motor: Other (comment) Motor - Skilled Clinical Observations:  (global weakness) Mobility  Transfers Transfers: Sit to Stand;Stand to Sit Sit to Stand: 1: +2 Total assist Sit to Stand: Patient Percentage: 50% Sit to Stand Details: Verbal cues for precautions/safety;Verbal cues for technique;Verbal cues for safe use of DME/AE Stand to Sit: 1: +2 Total assist Stand to Sit: Patient Percentage: 50%  Trunk/Postural Assessment  Cervical Assessment Cervical Assessment: Within Functional Limits Thoracic Assessment Thoracic Assessment: Within Functional Limits Lumbar Assessment Lumbar Assessment: Exceptions to [facility] (Not assessed secondary to back precautions) Postural Control Postural Control: Deficits on evaluation  Balance Balance Balance Assessed: Yes Static Sitting Balance Static Sitting - Comment/# of Minutes:  (Required unilateral support at EOB to maintain balance during UB ADLs) Dynamic Sitting Balance Sitting balance - Comments:  (Pt completed ADLs with dynamic sitting demands with  occasional Min A for balance) Static Standing Balance Static Standing - Comment/# of Minutes:  (Pt required bilateral UE support to maintain balance in Stedy) Extremity/Trunk Assessment RUE Assessment RUE Assessment: Within Functional Limits LUE Assessment LUE Assessment: Within Functional Limits   See Function Navigator for Current Functional Status.   Refer to Care Plan for Long Term Goals  Recommendations for other services: None    Discharge Criteria: Patient will be discharged from OT if patient refuses treatment 3 consecutive times without medical reason, if treatment goals not met, if there is a change in medical status, if patient makes no progress towards goals or if patient is discharged from hospital.  The above assessment, treatment plan, treatment alternatives and goals were discussed and mutually agreed upon: by patient  Skeet Simmer 12/05/2016, 8:23 PM

## 2016-12-05 NOTE — Progress Notes (Signed)
Dressing to back curling up into knot at lower back and steri strips beneath coming off. Dressing replaced with ABD pad and gauze per patient request

## 2016-12-05 NOTE — Evaluation (Signed)
Physical Therapy Assessment and Plan  Patient Details  Name: MALAKHI MARKWOOD MRN: [id number] Date of Birth: [birth year]  PT Diagnosis: Abnormal posture, Difficulty walking, Impaired sensation, Low back pain and Muscle weakness Rehab Potential: Good ELOS: 18-21 days   Today's Date: 12/05/2016 PT Individual Time: 7673-4193 PT Individual Time Calculation (min): 83 min     Problem List: Patient Active Problem List   Diagnosis Date Noted  . Hypertensive crisis   . Radiculopathy 12/04/2016  . Surgery, elective   . Weakness of both legs   . Post-operative pain   . Benign essential HTN   . Prediabetes   . Constipation due to pain medication   . Back pain 12/01/2016  . Bradycardia 07/17/2014  . Essential hypertension 07/17/2014  . Syncope 07/15/2014    Past Medical History:  Past Medical History:  Diagnosis Date  . Arthritis   . Diabetes mellitus without complication ([place]) Resolved 2014 after weight loss  . Hyperlipidemia Resolved 2014 after weight loss  . Hypertension   . Weight loss    Past Surgical History:  Past Surgical History:  Procedure Laterality Date  . LAPAROSCOPIC GASTRIC SLEEVE RESECTION    . LUMBAR LAMINECTOMY  12/01/2016   Lumbar decompression L2-5 LUMBAR LAMINECTOMY/DECOMPRESSION MICRODISCECTOMY 3 LEVELS (N/A)  . LUMBAR LAMINECTOMY/DECOMPRESSION MICRODISCECTOMY N/A 12/01/2016   Procedure: Lumbar decompression L2-5 LUMBAR LAMINECTOMY/DECOMPRESSION MICRODISCECTOMY 3 LEVELS;  Surgeon: Melina Schools, MD;  Location: [place];  Service: Orthopedics;  Laterality: N/A;    Assessment & Plan Clinical Impression: Patient is a 56 y.o. year old male with recent admission to the hospital on 12/01/2016 with low back pain radiating to the lower extremities. Imaging reviewed, showing severe lumbar stenosis with radiculopathy L2-3, 3-4 4-5. No change with conservative care. Underwent lumbar decompression L2-L5 12/01/2016  Patient transferred to CIR on 12/04/2016 .   Patient currently  requires total with mobility secondary to muscle weakness and decreased sitting balance, decreased standing balance, decreased postural control and decreased balance strategies.  Prior to hospitalization, patient was modified independent  with mobility and lived with Spouse, Daughter in a House home.  Home access is 2Stairs to enter.  Patient will benefit from skilled PT intervention to maximize safe functional mobility, minimize fall risk and decrease caregiver burden for planned discharge home with intermittent assist.  Anticipate patient will benefit from follow up OP at discharge.  PT - End of Session Activity Tolerance: Tolerates 30+ min activity with multiple rests Endurance Deficit: Yes PT Assessment Rehab Potential (ACUTE/IP ONLY): Good Barriers to Discharge: Inaccessible home environment PT Patient demonstrates impairments in the following area(s): Balance;Endurance;Motor;Pain;Sensory PT Transfers Functional Problem(s): Bed Mobility;Bed to Chair;Car;Furniture PT Locomotion Functional Problem(s): Ambulation;Stairs;Wheelchair Mobility PT Plan PT Intensity: Minimum of 1-2 x/day ,45 to 90 minutes PT Frequency: 5 out of 7 days PT Duration Estimated Length of Stay: 18-21 days PT Treatment/Interventions: Ambulation/gait training;Discharge planning;DME/adaptive equipment instruction;Functional mobility training;Pain management;Splinting/orthotics;Therapeutic Activities;UE/LE Strength taining/ROM;Wheelchair propulsion/positioning;UE/LE Coordination activities;Therapeutic Exercise;Stair training;Patient/family education;Neuromuscular re-education;Functional electrical stimulation;Community reintegration;Balance/vestibular training PT Transfers Anticipated Outcome(s): supervision PT Locomotion Anticipated Outcome(s): supervision gait, mod I w/c PT Recommendation Follow Up Recommendations: Outpatient PT Equipment Recommended: To be determined  Skilled Therapeutic Intervention Pt performed  rolling in bed with max A to each side to don pants, pt unable to bend knees to assist with rolling, cues for technique.  Supine to sit with log roll technique with max A.  Pt requires bilat UE support for sitting balance EOB. Scoot pivot transfer to w/c with +2 total A, pt with difficulty assisting  with transfers with UEs.  Pt requires total A to position self in w/c initially, as session progresses pt able to assist with UEs and reposition self in chair with min A.  Sit to stand multiple attempts to stedy with +2 total A.  Pt able to tolerate sitting in stedy x 5 minutes.  LE therex for AAROM strengthening bilat LEs with tapping to assist with activating hamstrings, strength less than 3/5 bilat.  W/c mobility with supervision, pt with improving UE use throughout session.  PT Evaluation Precautions/Restrictions Precautions Precautions: Back;Fall Required Braces or Orthoses: Spinal Brace Spinal Brace: Lumbar corset;Applied in sitting position Restrictions Weight Bearing Restrictions: No Pain Pain Assessment Pain Assessment: No/denies pain Home Living/Prior Functioning Home Living Available Help at Discharge: Family;Available 24 hours/day Type of Home: House Home Access: Stairs to enter [employer] of Steps: 2 Entrance Stairs-Rails: None Home Layout: Bed/bath upstairs;Two level Alternate Level Stairs-Number of Steps: 12 Alternate Level Stairs-Rails: Right Additional Comments: pt used RW x 2 months prior to surgery  Lives With: Spouse;Daughter Prior Function Level of Independence: Requires assistive device for independence  Able to Take Stairs?: Yes Driving: Yes 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, lots of standing and walking  Cognition Overall Cognitive Status: Within Functional Limits for tasks assessed Arousal/Alertness: Awake/alert Orientation Level: Oriented X4 Sensation Sensation Light Touch: Impaired Detail Light Touch Impaired Details: Impaired  LLE;Impaired RLE Proprioception: Impaired Detail Proprioception Impaired Details: Impaired LLE;Impaired RLE Additional Comments: Rt LE more with decreased sensation and proprioception vs Lt Coordination Gross Motor Movements are Fluid and Coordinated: No Motor  Motor Motor - Skilled Clinical Observations: generalized weakness   Trunk/Postural Assessment  Cervical Assessment Cervical Assessment: Within Functional Limits Thoracic Assessment Thoracic Assessment: Within Functional Limits Lumbar Assessment Lumbar Assessment:  (lumbar corset) Postural Control Postural Control: Deficits on evaluation (decreased trunk control)  Balance Balance Balance Assessed: Yes Static Sitting Balance Static Sitting - Comment/# of Minutes: able to sit EOB x 5 minutes with bilat UE support due to decreased trunk strength Static Standing Balance Static Standing - Comment/# of Minutes: standing in stedy requires bilat UE support Extremity Assessment      RLE Assessment RLE Assessment:  (PF/DF 2/5, knee flex 2/5, ext 2/5, hip flex 2/5) LLE Assessment LLE Assessment:  (knee ext 3/5, PF/DF 3-/5, hip flex 3-/5)   See Function Navigator for Current Functional Status.   Refer to Care Plan for Long Term Goals  Recommendations for other services: None   Discharge Criteria: Patient will be discharged from PT if patient refuses treatment 3 consecutive times without medical reason, if treatment goals not met, if there is a change in medical status, if patient makes no progress towards goals or if patient is discharged from hospital.  The above assessment, treatment plan, treatment alternatives and goals were discussed and mutually agreed upon: by patient  [facility] 12/05/2016, 10:11 AM

## 2016-12-05 NOTE — Progress Notes (Signed)
**  Preliminary report by tech**  Bilateral lower extremity venous duplex completed. There is no evidence of deep or superficial vein thrombosis involving the right and left lower extremities. All visualized vessels appear patent and compressible. There is no evidence of Baker's cysts bilaterally.  12/05/16 2:02 PM [name] RVT

## 2016-12-06 ENCOUNTER — Inpatient Hospital Stay (HOSPITAL_COMMUNITY): Payer: Managed Care, Other (non HMO) | Admitting: Physical Therapy

## 2016-12-06 ENCOUNTER — Inpatient Hospital Stay (HOSPITAL_COMMUNITY): Payer: Managed Care, Other (non HMO) | Admitting: Occupational Therapy

## 2016-12-06 DIAGNOSIS — M5415 Radiculopathy, thoracolumbar region: Secondary | ICD-10-CM

## 2016-12-06 LAB — COMPREHENSIVE METABOLIC PANEL
ALT: 19 U/L (ref 17–63)
ANION GAP: 8 (ref 5–15)
AST: 19 U/L (ref 15–41)
Albumin: 2.8 g/dL — ABNORMAL LOW (ref 3.5–5.0)
Alkaline Phosphatase: 45 U/L (ref 38–126)
BUN: 17 mg/dL (ref 6–20)
CHLORIDE: 102 mmol/L (ref 101–111)
CO2: 28 mmol/L (ref 22–32)
Calcium: 8.9 mg/dL (ref 8.9–10.3)
Creatinine, Ser: 0.96 mg/dL (ref 0.61–1.24)
Glucose, Bld: 92 mg/dL (ref 65–99)
POTASSIUM: 3.5 mmol/L (ref 3.5–5.1)
SODIUM: 138 mmol/L (ref 135–145)
Total Bilirubin: 1.2 mg/dL (ref 0.3–1.2)
Total Protein: 5.7 g/dL — ABNORMAL LOW (ref 6.5–8.1)

## 2016-12-06 LAB — CBC WITH DIFFERENTIAL/PLATELET
Basophils Absolute: 0 10*3/uL (ref 0.0–0.1)
Basophils Relative: 0 %
EOS ABS: 0.3 10*3/uL (ref 0.0–0.7)
Eosinophils Relative: 3 %
HCT: 33.6 % — ABNORMAL LOW (ref 39.0–52.0)
Hemoglobin: 11.3 g/dL — ABNORMAL LOW (ref 13.0–17.0)
LYMPHS ABS: 1.9 10*3/uL (ref 0.7–4.0)
Lymphocytes Relative: 23 %
MCH: 28.3 pg (ref 26.0–34.0)
MCHC: 33.6 g/dL (ref 30.0–36.0)
MCV: 84.2 fL (ref 78.0–100.0)
MONOS PCT: 10 %
Monocytes Absolute: 0.8 10*3/uL (ref 0.1–1.0)
Neutro Abs: 5.3 10*3/uL (ref 1.7–7.7)
Neutrophils Relative %: 64 %
Platelets: 170 10*3/uL (ref 150–400)
RBC: 3.99 MIL/uL — AB (ref 4.22–5.81)
RDW: 14 % (ref 11.5–15.5)
WBC: 8.2 10*3/uL (ref 4.0–10.5)

## 2016-12-06 NOTE — Progress Notes (Signed)
Physical Therapy Session Note  Patient Details  Name: [name] MRN: [id number] Date of Birth: Mar 14, 1961  Today's Date: 12/06/2016 PT Individual Time: 1100-1157 PT Individual Time Calculation (min): 57 min    Short Term Goals: Week 1:  PT Short Term Goal 1 (Week 1): Pt will perform bed <> chair transfers with max A PT Short Term Goal 2 (Week 1): Pt will perform gait in controlled environment wiht max A x 10'  Skilled Therapeutic Interventions/Progress Updates:   Pt received seated in w/c, denies pain and agreeable to treatment. Sit <>stand in stedy maxA from w/c, minA/S from stedy seat once up. Performed 2 sets 5 reps sit <>stand from stedy seat. W/c propulsion 2x175' with BUE and S for strengthening and endurance. Squat pivot transfer min guard w/c> mat table. Demonstrated slide board transfer to pt; returns demonstration with S and min cues for technique and board placement. Seated LE strengthening exercises 3x10 reps each including long arc quad, hip flexion marching, ankle pumps, hip adduction isometric. Provided handout with pictures and written directions for all seated/supine exercises and educated regarding performance for continued strengthening/NMR. Returned to room with w/c propulsion as above. Discussed d/c plan with pt and goal of pt ambulating in home but plan to reassess equipment needs (i.e. W/c) closer to d/c, pt agreeable. Remained seated in w/c at end of session, all needs in reach.   Therapy Documentation Precautions:  Precautions Precautions: Back, Fall Required Braces or Orthoses: Spinal Brace Spinal Brace: Lumbar corset, Applied in standing position Restrictions Weight Bearing Restrictions: No Pain: Pain Assessment Pain Score: 0-No pain   See Function Navigator for Current Functional Status.   Therapy/Group: Individual Therapy  Luberta Mutter 12/06/2016, 11:58 AM

## 2016-12-06 NOTE — Progress Notes (Signed)
Occupational Therapy Session Note  Patient Details  Name: [name] MRN: [id number] Date of Birth: 02/26/1961  Today's Date: 12/06/2016 OT Individual Time: [id number] OT Individual Time Calculation (min): 78 min    Short Term Goals: Week 1:  OT Short Term Goal 1 (Week 1): Pt will complete toilet transfer with 1 helper and LRAD OT Short Term Goal 2 (Week 1): Pt will complete LB dressing with Max A and AE PRN OT Short Term Goal 3 (Week 1): Pt will complete sit<stand for LB ADLs with LRAD and 1 helper OT Short Term Goal 4 (Week 1): Pt will complete bathing with AE and 1 helper    Skilled Therapeutic Interventions/Progress Updates:   Skilled OT session completed with focus on ADL retraining and toilet transfers. Pt was sitting in w/c at time of arrival, agreeable to tx but reported feeling fatigued from previous therapies. Practiced toilet transfers with Stedy to drop arm commode and 1 helper. BSC able to accommodate Stedy in room and over toilet with arm rests dropped. Stedy maneuvered over bathroom threshold without safety concern. Pt required rest breaks between sit<stands, requiring overall Max A for power up in [place]. Practiced hygiene completion with and without toilet aide while standing in [place]. During simulation, pt able to complete hygiene without breaking precautions or using toilet aide. Afterwards pt reported feeling too fatigued to continue with Stedy transfers, was returned to w/c. Continued practice with AE for LB dressing/donning footwear to improve proficiency/speed with these tasks during ADLs. Extra time required for pt to complete at max level of independence. Pt very limited by LE weakness. At end of session pt was left in w/c with all needs within reach.    Therapy Documentation Precautions:  Precautions Precautions: Back, Fall Required Braces or Orthoses: Spinal Brace Spinal Brace: Lumbar corset, Applied in standing position Restrictions Weight Bearing Restrictions:  No General:   Vital Signs: Therapy Vitals Temp: 98.8 F (37.1 C) Temp Source: Oral Pulse Rate: 80 Resp: 18 BP: (!) 103/58 Patient Position (if appropriate): Sitting Oxygen Therapy SpO2: 100 % O2 Device: Not Delivered Pain: No c/o pain during session    ADL: ADL ADL Comments: Please see functional navigator for ADL status    See Function Navigator for Current Functional Status.   Therapy/Group: Individual Therapy  Hien Perreira A Tuff Clabo 12/06/2016, 4:09 PM

## 2016-12-06 NOTE — IPOC Note (Signed)
Overall Plan of Care [facility]) Patient Details Name: SHAYDEN VANGORDEN MRN: [id number] DOB: [birth year]  Admitting Diagnosis: Thoraic Myelopathy  Hospital Problems: Active Problems:   Radiculopathy   Hypertensive crisis     Functional Problem List: Nursing Bladder, Bowel, Medication Management, Pain, Skin Integrity  PT Balance, Endurance, Motor, Pain, Sensory  OT Balance, Safety, Sensory, Endurance, Motor  SLP    TR         Basic ADL's: OT Grooming, Bathing, Dressing, Toileting     Advanced  ADL's: OT       Transfers: PT Bed Mobility, Bed to Chair, Car, Manufacturing systems engineer, Metallurgist: PT Ambulation, Data processing manager, Emergency planning/management officer     Additional Impairments: OT    SLP        TR      Anticipated Outcomes Item Anticipated Outcome  Self Feeding N/A  Swallowing      Basic self-care  Supervision-Min A  Comptroller Transfers Supervision   Bowel/Bladder  manage bladder, Mod I  Transfers  supervision  Locomotion  supervision gait, mod I w/c  Communication     Cognition     Pain  Less than 4  Safety/Judgment  Remain free of infection, breakdown and falls.    Therapy Plan: PT Intensity: Minimum of 1-2 x/day ,45 to 90 minutes PT Frequency: 5 out of 7 days PT Duration Estimated Length of Stay: 18-21 days OT Intensity: Minimum of 1-2 x/day, 45 to 90 minutes OT Frequency: 5 out of 7 days OT Duration/Estimated Length of Stay: 3 weeks          Team Interventions: Nursing Interventions Bladder Management, Bowel Management, Pain Management, Patient/Family Education, Skin Care/Wound Management  PT interventions Ambulation/gait training, Discharge planning, DME/adaptive equipment instruction, Functional mobility training, Pain management, Splinting/orthotics, Therapeutic Activities, UE/LE Strength taining/ROM, Wheelchair propulsion/positioning, UE/LE Coordination activities, Therapeutic Exercise, Stair training, Patient/family  education, Neuromuscular re-education, Functional electrical stimulation, Community reintegration, Training and development officer  OT Interventions Discharge planning, Self Care/advanced ADL retraining, Therapeutic Activities, Functional mobility training, Patient/family education, Therapeutic Exercise, DME/adaptive equipment instruction, Psychosocial support, UE/LE Strength taining/ROM  SLP Interventions    TR Interventions    SW/CM Interventions Discharge Planning, Psychosocial Support, Patient/Family Education    Team Discharge Planning: Destination: PT-  ,OT- Home , SLP-  Projected Follow-up: PT-Outpatient PT, OT-  24 hour supervision/assistance, SLP-  Projected Equipment Needs: PT-To be determined, OT- To be determined, SLP-  Equipment Details: PT- , OT-  Patient/family involved in discharge planning: PT- Patient,  OT-Patient, SLP-   MD ELOS: 18-20 days Medical Rehab Prognosis:  Excellent Assessment: The patient has been admitted for CIR therapies with the diagnosis of lumbar stenosis/radiculopathy s/p decompression and fusion. The team will be addressing functional mobility, strength, stamina, balance, safety, adaptive techniques and equipment, self-care, bowel and bladder mgt, patient and caregiver education, pain mgt, back precautions, wound care, orthotic mgt, community reintegration. Goals have been set at supervision for basic self-care and supervision to mod I with mobility (mod I with w/c).    Meredith Staggers, MD, FAAPMR      See Team Conference Notes for weekly updates to the plan of care

## 2016-12-06 NOTE — Progress Notes (Signed)
Occupational Therapy Session Note  Patient Details  Name: [name] MRN: [id number] Date of Birth: July 24, 1961  Today's Date: 12/06/2016 OT Individual Time: [id number] OT Individual Time Calculation (min): 60 min     Short Term Goals:Week 1:  OT Short Term Goal 1 (Week 1): Pt will complete toilet transfer with 1 helper and LRAD OT Short Term Goal 2 (Week 1): Pt will complete LB dressing with Max A and AE PRN OT Short Term Goal 3 (Week 1): Pt will complete sit<stand for LB ADLs with LRAD and 1 helper OT Short Term Goal 4 (Week 1): Pt will complete bathing with AE and 1 helper  Skilled Therapeutic Interventions/Progress Updates:    Pt seen for OT ADL bating/dressing session. Pt in supine upon arrival, voicing pain 1-2/10 at surgical site. RN made aware and medication administered. He transferred to EOB with min A using hospital bed functions with increased time and assist from therapist for LE management and use of chuck pad to adjust hips. +2 required for use of STEADY from elevated bed for transfer to w/[name]. Bathing/dressing and grooming completed from w/[name] level at sink with set-up assist for UB. AE used for LB Bathing/ dressing using reacher to assist with threading pants with set-up assist from therapist for clothing management. He was able to stand in STEADY with mod-max A +1 and total A to pull pants up. Pt demonstrated excellent patience when using AE, taking increased time, however, very motivated to complete independently. Shoes donned total A for time management. Pt left seated in w/[name] at end of session set-up with breakfast tray and all needs in reach. Educated throughout session regarding SCI, POC, OT/PT goals and d/[name] planning.   Therapy Documentation Precautions:  Precautions Precautions: Back, Fall Required Braces or Orthoses: Spinal Brace Spinal Brace: Lumbar corset, Applied in standing position Restrictions Weight Bearing Restrictions: No ADL: ADL ADL Comments: Please see  functional navigator for ADL status  See Function Navigator for Current Functional Status.   Therapy/Group: Individual Therapy  Lewis, [name] 12/06/2016, 7:08 AM

## 2016-12-06 NOTE — Progress Notes (Signed)
OT Note - Addendum G-Codes    Dec 07, 2016 1605  OT Visit Information  Last OT Received On 07-Dec-2016  OT G-codes **NOT FOR INPATIENT CLASS**  Functional Assessment Tool Used self care  Functional Limitation Self care  Self Care Current Status [phone]) CM  Self Care Goal Status [phone]) [place], OT/L  [phone] 12/06/2016

## 2016-12-06 NOTE — Discharge Summary (Signed)
Physician Discharge Summary  Patient ID: STONY LIPPER MRN: [id number] DOB/AGE: 1961-08-29 56 y.o.  Admit date: 12/01/2016 Discharge date: 12/06/2016  Admission Diagnoses:  Severe Lumbar Stenosis  Discharge Diagnoses:  Active Problems:   Back pain   Surgery, elective   Weakness of both legs   Post-operative pain   Benign essential HTN   Prediabetes   Constipation due to pain medication   Past Medical History:  Diagnosis Date  . Arthritis   . Diabetes mellitus without complication ([place]) Resolved 2014 after weight loss  . Hyperlipidemia Resolved 2014 after weight loss  . Hypertension   . Weight loss     Surgeries: Procedure(s): Lumbar decompression L2-5 LUMBAR LAMINECTOMY/DECOMPRESSION MICRODISCECTOMY 3 LEVELS on 12/01/2016   Consultants (if any):   Discharged Condition: Improved  Hospital Course: DEVONTAE WIERMAN is an 56 y.o. male who was admitted 12/01/2016 with a diagnosis of Severe Lumbar Syenosis and went to the operating room on 12/01/2016 and underwent the above named procedures. Post op pt still had significant RL weakness that was present prior to surgery.  Sensation and movement was improving.  Pts pain was well controlled on oral medication.  Pt able to void once foley was removed.  Dr. Rolena Infante and Pt recommended inpatient rehab prior to DC home.   He was given perioperative antibiotics:  Anti-infectives    Start     Dose/Rate Route Frequency Ordered Stop   12/02/16 1300  ceFAZolin (ANCEF) IVPB 1 g/50 mL premix  Status:  Discontinued     1 g 100 mL/hr over 30 Minutes Intravenous Every 8 hours 12/02/16 0724 12/03/16 0731   12/01/16 2000  ceFAZolin (ANCEF) IVPB 2g/100 mL premix     2 g 200 mL/hr over 30 Minutes Intravenous Every 8 hours 12/01/16 1542 12/02/16 0529   12/01/16 0800  ceFAZolin (ANCEF) 3 g in dextrose 5 % 50 mL IVPB     3 g 130 mL/hr over 30 Minutes Intravenous To ShortStay Surgical 11/30/16 1037 12/01/16 1232    .  He was given sequential compression  devices, early ambulation, and TED for DVT prophylaxis.  He benefited maximally from the hospital stay and there were no complications.    Recent vital signs:  Vitals:   12/04/16 0907 12/04/16 1310  BP: (!) 113/57 (!) 115/59  Pulse: 75 74  Resp: 16 20  Temp: 99 F (37.2 C) 98.8 F (37.1 C)    Recent laboratory studies:  Lab Results  Component Value Date   HGB 11.3 (L) 12/06/2016   HGB 13.9 11/25/2016   HGB 13.8 07/15/2014   Lab Results  Component Value Date   WBC 8.2 12/06/2016   PLT 170 12/06/2016   No results found for: INR Lab Results  Component Value Date   NA 138 12/06/2016   K 3.5 12/06/2016   CL 102 12/06/2016   CO2 28 12/06/2016   BUN 17 12/06/2016   CREATININE 0.96 12/06/2016   GLUCOSE 92 12/06/2016    Discharge Medications:   Allergies as of 12/04/2016      Reactions   No Known Allergies       Medication List    STOP taking these medications   pregabalin 50 MG capsule Commonly known as:  LYRICA   traMADol 50 MG tablet Commonly known as:  ULTRAM     TAKE these medications   aspirin EC 81 MG tablet Take 81 mg by mouth daily.   eplerenone 50 MG tablet Commonly known as:  INSPRA Take 25 mg  by mouth daily.   methocarbamol 500 MG tablet Commonly known as:  ROBAXIN Take 1 tablet (500 mg total) by mouth 3 (three) times daily as needed for muscle spasms.   multivitamin with minerals tablet Take 1 tablet by mouth daily.   ondansetron 4 MG tablet Commonly known as:  ZOFRAN Take 1 tablet (4 mg total) by mouth every 8 (eight) hours as needed for nausea or vomiting.   oxyCODONE-acetaminophen 10-325 MG tablet Commonly known as:  PERCOCET Take 1 tablet by mouth every 4 (four) hours as needed for pain.   TRIBENZOR 40-5-25 MG Tabs Generic drug:  Olmesartan-Amlodipine-HCTZ Take 1 tablet by mouth daily.       Diagnostic Studies: Dg Lumbar Spine 2-3 Views  Addendum Date: 12/01/2016   ADDENDUM REPORT: 12/01/2016 12:52 ADDENDUM: Additional  imaging was at at after the initial interpretation. The second image demonstrates posterior surgical instruments directed at the L4-5 level. The third image (image labeled #2) demonstrates posterior surgical instruments extending from L2-3 to the L5 vertebral body. Electronically Signed   By: Rolm Baptise M.D.   On: 12/01/2016 12:52   Result Date: 12/01/2016 CLINICAL DATA:  L2-L5 lumbar decompression. EXAM: LUMBAR SPINE - 2-3 VIEW COMPARISON:  MRI 10/18/2016 FINDINGS: Posterior needles are directed along the superior aspect of the L2 and L4 spinous processes. IMPRESSION: Intraoperative localization as above. Electronically Signed: By: Rolm Baptise M.D. On: 12/01/2016 09:58   Mr Lumbar Spine Wo Contrast  Result Date: 12/01/2016 CLINICAL DATA:  Bilateral lower extremity weakness. Lumbar decompression earlier today. EXAM: MRI LUMBAR SPINE WITHOUT CONTRAST TECHNIQUE: Multiplanar, multisequence MR imaging of the lumbar spine was performed. No intravenous contrast was administered. COMPARISON:  10/18/2016 FINDINGS: The patient had difficulty tolerating the examination. The study is motion degraded, and there is decreased signal through the lower thoracic spine. The axial sequences do not cross reference to the sagittal sequences. Segmentation:  Standard. Alignment:  Unchanged.  No listhesis. Vertebrae: Preserved vertebral body heights without evidence of fracture or suspicious osseous lesion. Mild degenerative endplate edema at 075-GRM. Congenitally short pedicles. Conus medullaris: Extends to the L2 level. Sagittal T2 and STIR sequences demonstrate new abnormal hyperintense signal abnormality within the distal cord extending from the L1 level superiorly into the visualized lower thoracic spine. Axial T2 sequences are motion degraded, however some images clearly demonstrate T2 hyperintensity involving much of the cross-section of the cord, greatest centrally (for example series 3 images 5 and 17). Paraspinal and other  soft tissues: Postoperative changes in the posterior soft tissues from L2-L5 with a surgical drain in place. Fluid is present in the surgical bed without a compressive epidural collection identified. T2 hyperintense lesions are again noted in left kidney, likely cysts. Disc levels: T10-11: Minimal disc bulging, congenitally short pedicles, and facet hypertrophy result in mild spinal stenosis. The neural foramina are not well evaluated. T11-12: Minimal disc bulging, congenitally short pedicles, and moderate facet and ligamentum flavum hypertrophy result in mild-to-moderate spinal stenosis and mild-to-moderate bilateral neural foraminal stenosis. T12-L1: Congenitally short pedicles and facet and ligamentum flavum hypertrophy result in mild spinal stenosis. No significant neural foraminal stenosis. L1-2: The possible left paracentral/subarticular disc fragment described on the prior study is less well seen on the current examination due to study quality though is likely still present. Circumferential disc bulging, congenitally short pedicles, and facet and ligamentum flavum hypertrophy result in mild spinal stenosis, mild right and moderate left lateral recess stenosis, and mild bilateral neural foraminal stenosis, similar to prior. L2-3: Interval laminectomies with  mild residual spinal stenosis, improved from prior. Disc bulging and facet hypertrophy result in mild bilateral neural foraminal stenosis, unchanged. L3-4: Interval laminectomies without residual spinal stenosis. Disc bulging and facet hypertrophy result in mild right and mild-to-moderate left neural foraminal stenosis, unchanged. L4-5: Interval laminectomies without residual spinal stenosis. Disc bulging and facet hypertrophy result in mild-to-moderate bilateral neural foraminal stenosis, unchanged. L5-S1: Moderate disc space narrowing with disc desiccation. Interval left laminotomy. Circumferential disc bulging and facet hypertrophy result in moderate  right and mild residual left lateral recess stenosis and moderate right greater than left neural foraminal stenosis. IMPRESSION: 1. New T2 edema throughout the distal spinal cord extending from L1 into the visualized lower thoracic spine. No high-grade compressive spinal stenosis is identified, and this may reflect ischemia from a vascular insult. 2. Interval L2-L5 posterior decompression as above. 3. 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 Findings discussed via telephone with Dr. Rolena Infante on 12/01/2016 at 6:30 p.m. Electronically Signed   By: Logan Bores M.D.   On: 12/01/2016 19:03    Disposition: [facility]-Re[facility] Facility Pt discharged to inpatient rehab Pt will present in clinic in 2 weeks for suture removal unless the pt is still inpatient.  Discharge Instructions    Incentive spirometry RT    Complete by:  As directed       Follow-up Information    BROOKS,DAHARI D, MD. Schedule an appointment as soon as possible for a visit in 2 weeks.   Specialty:  Orthopedic Surgery Why:  If symptoms worsen, For suture removal, For wound re-check Contact information: [address] [address] [place] [address] [id number]            Signed: Valinda Hoar 12/06/2016, 2:34 PM

## 2016-12-06 NOTE — Progress Notes (Signed)
[place] PHYSICAL MEDICINE & REHABILITATION     PROGRESS NOTE  Subjective/Complaints:  Up with OT getting dressed. Pain seems controlled. OT asked if he can shower. Tingling less in legs  ROS: pt denies nausea, vomiting, diarrhea, cough, shortness of breath or chest pain   Objective: Vital Signs: Blood pressure 137/78, pulse 72, temperature 98.9 F (37.2 C), temperature source Oral, resp. rate 18, height 6\' 7"  (2.007 m), weight 126.5 kg (278 lb 12.8 oz), SpO2 99 %. No results found.  Recent Labs  12/06/16 0359  WBC 8.2  HGB 11.3*  HCT 33.6*  PLT 170    Recent Labs  12/06/16 0359  NA 138  K 3.5  CL 102  GLUCOSE 92  BUN 17  CREATININE 0.96  CALCIUM 8.9   CBG (last 3)  No results for input(s): GLUCAP in the last 72 hours.  Wt Readings from Last 3 Encounters:  12/04/16 126.5 kg (278 lb 12.8 oz)  11/25/16 122.8 kg (270 lb 11.2 oz)  11/03/16 117 kg (258 lb)    Physical Exam:  BP 137/78 (BP Location: Left Arm)   Pulse 72   Temp 98.9 F (37.2 C) (Oral)   Resp 18   Ht 6\' 7"  (2.007 m)   Wt 126.5 kg (278 lb 12.8 oz)   SpO2 99%   BMI 31.41 kg/m  Constitutional: NAD. Marland Kitchen HENT: Normocephalic. Atraumatic Eyes: EOM are normal. No discharge.  Cardiovascular: RRR. No jvd Respiratory: clear  GI: Soft. Bowel sounds are normal.  Musculoskeletal: He exhibits no edema or tenderness.  Neurological: He is alert and oriented.  Sensation diminished to light touch RLE 1+/2 Motor: B/l UE 5/5 proximal to distal LLE: 4-/5 HF, KE, 4/5 ADP/PF RLE: 2/5 HF, KE, 4-/5 ADP/PF--stable  Skin: Skin is warm and dry. With sterstrips Incision c/d/i  Psychiatric: He has a normal mood and affect. His behavior is normal.   Assessment/Plan: 1. Functional deficits secondary to lumbar stenosis with radiculopathy status post decompression L-2-L5 which require 3+ hours per day of interdisciplinary therapy in a comprehensive inpatient rehab setting. Physiatrist is providing close team  supervision and 24 hour management of active medical problems listed below. Physiatrist and rehab team continue to assess barriers to discharge/monitor patient progress toward functional and medical goals.  Function:  Bathing Bathing position   Position: Wheelchair/chair at sink  Bathing parts Body parts bathed by patient: Right arm, Left arm, Chest, Abdomen, Front perineal area, Right upper leg, Left upper leg, Right lower leg, Left lower leg, Back Body parts bathed by helper: Buttocks  Bathing assist Assist Level: 2 helpers      Upper Body Dressing/Undressing Upper body dressing   What is the patient wearing?: Pull over shirt/dress, Orthosis     Pull over shirt/dress - Perfomed by patient: Thread/unthread right sleeve, Thread/unthread left sleeve, Put head through opening, Pull shirt over trunk       Orthosis activity level: Performed by helper  Upper body assist Assist Level: Supervision or verbal cues, Set up   Set up : To obtain clothing/put away, To apply TLSO, cervical collar  Lower Body Dressing/Undressing Lower body dressing   What is the patient wearing?: Underwear, Pants Underwear - Performed by patient: Thread/unthread right underwear leg, Thread/unthread left underwear leg     Pants- Performed by helper: Thread/unthread right pants leg, Thread/unthread left pants leg, Pull pants up/down   Non-skid slipper socks- Performed by helper: Don/doff right sock, Don/doff left sock       Shoes - Performed  by helper: Don/doff right shoe, Don/doff left shoe, Fasten right, Fasten left          Lower body assist Assist for lower body dressing: 2 Helpers      Toileting Toileting   Toileting steps completed by patient: Adjust clothing prior to toileting Toileting steps completed by helper: Performs perineal hygiene, Adjust clothing after toileting, Adjust clothing prior to toileting Toileting Assistive Devices: Grab bar or rail  Toileting assist      Transfers Chair/bed transfer   Chair/bed transfer method: Other Chair/bed transfer assist level: 2 helpers Chair/bed transfer assistive device: Mechanical lift Mechanical lift: Ecologist Ambulation activity did not occur: Safety/medical concerns         Wheelchair   Type: Manual Max wheelchair distance: 100 Assist Level: Supervision or verbal cues  Cognition Comprehension Comprehension assist level: Follows complex conversation/direction with no assist  Expression Expression assist level: Expresses complex ideas: With no assist  Social Interaction Social Interaction assist level: Interacts appropriately with others - No medications needed.  Problem Solving Problem solving assist level: Solves complex problems: Recognizes & self-corrects  Memory Memory assist level: Complete Independence: No helper    Medical Problem List and Plan: 1.  Decreased functional mobility secondary to lumbar stenosis with radiculopathy status post decompression L-2-L5. Back brace when out of bed  Continue CIR therapies  -have requested larger bed 2.  DVT Prophylaxis/Anticoagulation: SCDs.   Vascular study negative 3. Pain Management: Robaxin and oxycodone as needed  -appears controlled at present  -denies dsyesthesias 4. Mood: Provide emotional support 5. Neuropsych: This patient is capable of making decisions on his own behalf. 6. Skin/Wound Care: Routine skin checks. Monitor back incision. 7. Fluids/Electrolytes/Nutrition: encourage PO  -I personally reviewed all of the patient's labs today, and lab work is within normal limits.  -can remove NSL's 8. Hypertension. Aldactone 25 mg daily, HCTZ 25 mg daily, Avapro 300 mg daily, Norvasc 5 mg daily.   -labile over weekend  -better control the last 24 hours 9. Prediabetic. Hemoglobin A1c 5.3. Blood sugar checks discontinued 10. Constipation. Laxative assistance with results   LOS (Days) 2 A FACE TO FACE EVALUATION WAS  PERFORMED  SWARTZ,ZACHARY T 12/06/2016 8:52 AM

## 2016-12-06 NOTE — Progress Notes (Signed)
Patient information reviewed and entered into eRehab system by Storey Stangeland, RN, CRRN, PPS Coordinator.  Information including medical coding and functional independence measure will be reviewed and updated through discharge.    

## 2016-12-07 ENCOUNTER — Inpatient Hospital Stay (HOSPITAL_COMMUNITY): Payer: Managed Care, Other (non HMO) | Admitting: Occupational Therapy

## 2016-12-07 ENCOUNTER — Inpatient Hospital Stay (HOSPITAL_COMMUNITY): Payer: Managed Care, Other (non HMO) | Admitting: Physical Therapy

## 2016-12-07 NOTE — Progress Notes (Signed)
Occupational Therapy Session Note  Patient Details  Name: DONNE MECKEL MRN: [id number] Date of Birth: [birth year]  Today's Date: 12/07/2016 OT Individual Time: [id number] and 1300-1400 OT Individual Time Calculation (min): 75 min and 60 min    Short Term Goals:Week 1:  OT Short Term Goal 1 (Week 1): Pt will complete toilet transfer with 1 helper and LRAD OT Short Term Goal 2 (Week 1): Pt will complete LB dressing with Max A and AE PRN OT Short Term Goal 3 (Week 1): Pt will complete sit<stand for LB ADLs with LRAD and 1 helper OT Short Term Goal 4 (Week 1): Pt will complete bathing with AE and 1 helper  Skilled Therapeutic Interventions/Progress Updates:    Session One: Pt seen for OT ADL bathing/dressing session. Pt in supine upon arrival finishing breakfast and agreeable to tx session. He denied pain at rest, however, voiced soreness with mobility from sessions yesterday. Pt agreeable to bathing at shower level. STEADY used for functional transfers, pt requiring max A to stand from elevated bedside into STEADY and mod A to stand from STEADY seated with assist for controlled descent onto 3-1 BSC placed in shower. He bathed with set-up assist in sitting, using LH sponge for LB bathing, assist required for buttock hygiene when standing in STEADY. Max A with use of grab bars to stand intoSTEADY when exiting shower. He dressed seated in w/c using AE requiring significantly increased time and min A with VCs for problem solving technique/ clothing management. He stood in STEADY with mod A and with one UE supported on STEADY, pt able to let go and pull pants up, alternating support UE to complete task. Socks donned total A for time. Pt left sitting up in w/c at end of session, completing grooming tasks at sink and all needs in reach. Educated throughout session regarding SCI, rate of return, continuum of care, DME, and d/c planning.   Session Two: Pt seen for OT session focusing on functional transfers  and sit <> stand in prep for functional tasks. Pt sitting up in w/c upon arrival, agreeable to tx session. He self propelled w/c to therapy gym with supervision for UE strengthening.  In gym, reviewed w/c part management with pt now able to manage w/c parts independently. Completed squat pivot to therapy mat with assist to steady equipment. Pt stood from highly elevated mat with mod-max A to RW. Pt voiced increased tightness in attempt to stand in glutes/ hamstrings. Pt returned to supine on mat, provided with leg lifter and educated regarding use and pt able to manage LEs onto mat with min A and VCs for maintaining of back pre-cautions. Completed hamstring stretch in supine with pt voicing stretch feeling very good. Completed on B sides x3 sets. Educated/ demonstrated with use of towel for pt to perform self hamstring stretch from supine level.  Reviewed log rolling technique and use of leg lifter to progress LEs off EOM in prep for transfer to EOB, verbal and tactile cues for proper technique. He completed x2 sit> stand from EOM at [facility] with mod-max A. Pt with heavy reliance on UEs during standing, unable to relax shoulders or attempting to let go of RW. Pt tolerating ~10 seconds of static standing before requiring seated rest break.  He returned to room at end of session, completed squat pivot transfer to recliner with assist to steady equipment. Pt left in recliner with all needs in reach. Recommended pt use sliding board to return to bed/ w/c due to  low surface of recliner.  Educated throughout session regarding POC and activity progression.   Therapy Documentation Precautions:  Precautions Precautions: Back, Fall Required Braces or Orthoses: Spinal Brace Spinal Brace: Lumbar corset, Applied in standing position Restrictions Weight Bearing Restrictions: No Pain:   ADL: ADL ADL Comments: Please see functional navigator for ADL status  See Function Navigator for Current Functional  Status.   Therapy/Group: Individual Therapy  Lewis, Jaydan Chretien C 12/07/2016, 7:04 AM

## 2016-12-07 NOTE — Plan of Care (Signed)
Problem: RH PAIN MANAGEMENT Goal: RH STG PAIN MANAGED AT OR BELOW [name] PAIN GOAL Less than 4 Outcome: Progressing No c/o pain     

## 2016-12-07 NOTE — Progress Notes (Signed)
Social Work  Social Work Assessment and Plan  Patient Details  Name: [name] MRN: [id number] Date of Birth: 1961-01-23  Today's Date: 12/07/2016  Problem List:  Patient Active Problem List   Diagnosis Date Noted  . Hypertensive crisis   . Radiculopathy 12/04/2016  . Surgery, elective   . Weakness of both legs   . Post-operative pain   . Benign essential HTN   . Prediabetes   . Constipation due to pain medication   . Back pain 12/01/2016  . Bradycardia 07/17/2014  . Essential hypertension 07/17/2014  . Syncope 07/15/2014   Past Medical History:  Past Medical History:  Diagnosis Date  . Arthritis   . Diabetes mellitus without complication ([place]) Resolved 2014 after weight loss  . Hyperlipidemia Resolved 2014 after weight loss  . Hypertension   . Weight loss    Past Surgical History:  Past Surgical History:  Procedure Laterality Date  . LAPAROSCOPIC GASTRIC SLEEVE RESECTION    . LUMBAR LAMINECTOMY  12/01/2016   Lumbar decompression L2-5 LUMBAR LAMINECTOMY/DECOMPRESSION MICRODISCECTOMY 3 LEVELS (N/A)  . LUMBAR LAMINECTOMY/DECOMPRESSION MICRODISCECTOMY N/A 12/01/2016   Procedure: Lumbar decompression L2-5 LUMBAR LAMINECTOMY/DECOMPRESSION MICRODISCECTOMY 3 LEVELS;  Surgeon: Melina Schools, MD;  Location: [facility];  Service: Orthopedics;  Laterality: N/A;   Social History:  reports that he has never smoked. He has never used smokeless tobacco. He reports that he does not drink alcohol or use drugs.  Family / Support Systems Marital Status: Married Patient Roles: Spouse, Parent Spouse/Significant Other: wife, Ranson Holthaus @ (C) [phone] Children: daughters age 57 and 12 living in the home;  eldest daughter not currently working and able to assist when wife at work. Anticipated Caregiver: wife and two daughters Ability/Limitations of Caregiver: wife works, 57 yo daughter unemployed; 57 yo daughte in school Caregiver Availability: 24/7 Family Dynamics: Pt describes family as  very supportive and denies any concerns about support at d/c.  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 Legal Hisotry/Current Legal Issues: None Guardian/Conservator: None - per MD, pt is fully capable of making decisions on his own behalf.   Abuse/Neglect Physical Abuse: Denies Verbal Abuse: Denies Sexual Abuse: Denies Exploitation of patient/patient's resources: Denies Self-Neglect: Denies  Emotional Status [name] affect, behavior adn adjustment status: Pt very pleasant and able to complete assessment interview without difficulty. He denies any emotional distress.  He does report that he "hadn't planned" on needed inpatient rehab following surgery.  He adds that he is not upset by this and notes his "life philosophy is to just play the hand that is dealt."  He is optimistic about his recovery and "ready to work." Recent Psychosocial Issues: None Pyschiatric History: None Substance Abuse History: None  Patient / Family Perceptions, Expectations & Goals Pt/Family understanding of illness & functional limitations: Pt with good understanding of his stenosis and of surgery performed.  Offers good description of his current functional limitations/ need for CIR. Premorbid pt/family roles/activities: Pt was independent until ~ 3 weeks PTA when he began to use a rw for mobility at home. Anticipated changes in roles/activities/participation: wife and daughter to assume any caregiver duties Pt/family expectations/goals: "I hope I can make a lot of progress here."  [employer]: None Premorbid Home Care/DME Agencies: None Transportation available  at discharge: yes  Discharge Planning Living  Arrangements: Spouse/significant other, Children Support Systems: Spouse/significant other, Children Type of Residence: Private residence Insurance Resources: Multimedia programmer (specify) Psychologist, counselling) Financial Resources: Employment Museum/gallery curator Screen Referred: No Living Expenses: Higher education careers adviser Management: Patient Does the patient have any problems obtaining your medications?: No Home Management: Pt and family share responsibilities Patient/Family Preliminary Plans: Pt plans to d/c home with wife and daughters providing any needed assistance. Social Work Anticipated Follow Up Needs: HH/OP Expected length of stay: 18-21 days  Clinical Impression Very pleasant gentleman here following back surgery.  Good understanding of his functional limitations and role of CIR.  Denies any significant emotional distress.  Good family support available and pt very motivated.  Will follow for d/c planning needs.  Elizar Alpern 12/07/2016, 3:59 PM

## 2016-12-07 NOTE — Progress Notes (Signed)
Physical Therapy Session Note  Patient Details  Name: RYVER ADDAMS MRN: [id number] Date of Birth: Jul 11, 1961  Today's Date: 12/07/2016 PT Individual Time: 1000-1100 PT Individual Time Calculation (min): 60 min    Short Term Goals: Week 1:  PT Short Term Goal 1 (Week 1): Pt will perform bed <> chair transfers with max A PT Short Term Goal 2 (Week 1): Pt will perform gait in controlled environment wiht max A x 10'  Skilled Therapeutic Interventions/Progress Updates:    Pt received seated in w/c, denies pain and agreeable to treatment. W/c propulsion to/from gym with BUE and S for strengthening and aerobic endurance. Sit <>stand in parallel bars x3 trials with min/modA. Standing weight shifts walking in place x1 trial with min guard. Walking forward/backward in bars with heavy reliance on BUEs; min guard overall and therapist facilitating/blocking at R knee to prevent buckling. Performed x5 trials 8' each. Squat pivot transfer w/c <>mat table with close S and min cues for hip clearance over wheel. Sit <>stand from 25" elevated mat table with RW x3 trials with minA/min guard. Standing tolerance x30 sec before requiring rest break. Remained seated in w/c at end of session, all needs in reach.   Therapy Documentation Precautions:  Precautions Precautions: Back, Fall Required Braces or Orthoses: Spinal Brace Spinal Brace: Lumbar corset, Applied in standing position Restrictions Weight Bearing Restrictions: No   See Function Navigator for Current Functional Status.   Therapy/Group: Individual Therapy  Luberta Mutter 12/07/2016, 10:56 AM

## 2016-12-07 NOTE — Care Management Note (Signed)
Inpatient [facility] Individual Statement of Services  Patient Name:  [name]  Date:  12/07/2016  Welcome to the [facility].  Our goal is to provide you with an individualized program based on your diagnosis and situation, designed to meet your specific needs.  With this comprehensive rehabilitation program, you will be expected to participate in at least 3 hours of rehabilitation therapies Monday-Friday, with modified therapy programming on the weekends.  Your rehabilitation program will include the following services:  Physical Therapy (PT), Occupational Therapy (OT), 24 hour per day rehabilitation nursing, Therapeutic Recreaction (TR), Case Management (Social Worker), Rehabilitation Medicine, Nutrition Services and Pharmacy Services  Weekly team conferences will be held on Tuesdays to discuss your progress.  Your Social Worker will talk with you frequently to get your input and to update you on team discussions.  Team conferences with you and your family in attendance may also be held.  Expected length of stay: 18-21 days  Overall anticipated outcome: supervision  Depending on your progress and recovery, your program may change. Your Social Worker will coordinate services and will keep you informed of any changes. Your Social Worker's name and contact numbers are listed  below.  The following services may also be recommended but are not provided by the [facility] will be made to provide these services after discharge if needed.  Arrangements include referral to agencies that provide these services.  Your insurance has been verified to be:  [place] Your primary doctor is:  Dr. Nolon Rod  Pertinent information will be shared with your doctor and your insurance company.  Social Worker:  [place], [place] or ([phone]   Information discussed with and copy given to patient by: Lennart Pall, 12/07/2016, 4:01 PM

## 2016-12-07 NOTE — Progress Notes (Signed)
[facility] PHYSICAL MEDICINE & REHABILITATION     PROGRESS NOTE  Subjective/Complaints:  Up at EOB. No new issues. Slept well  ROS: pt denies nausea, vomiting, diarrhea, cough, shortness of breath or chest pain    Objective: Vital Signs: Blood pressure 121/64, pulse 71, temperature 98.8 F (37.1 C), temperature source Oral, resp. rate 18, height 6\' 7"  (2.007 m), weight 126.5 kg (278 lb 12.8 oz), SpO2 99 %. No results found.  Recent Labs  12/06/16 0359  WBC 8.2  HGB 11.3*  HCT 33.6*  PLT 170    Recent Labs  12/06/16 0359  NA 138  K 3.5  CL 102  GLUCOSE 92  BUN 17  CREATININE 0.96  CALCIUM 8.9   CBG (last 3)  No results for input(s): GLUCAP in the last 72 hours.  Wt Readings from Last 3 Encounters:  12/04/16 126.5 kg (278 lb 12.8 oz)  11/25/16 122.8 kg (270 lb 11.2 oz)  11/03/16 117 kg (258 lb)    Physical Exam:  BP 121/64 (BP Location: Left Arm)   Pulse 71   Temp 98.8 F (37.1 C) (Oral)   Resp 18   Ht 6\' 7"  (2.007 m)   Wt 126.5 kg (278 lb 12.8 oz)   SpO2 99%   BMI 31.41 kg/m  Constitutional: NAD. Marland Kitchen HENT: Normocephalic. Atraumatic Eyes: EOM are normal. No discharge.  Cardiovascular: RRR Respiratory: clear  GI: Soft. Bowel sounds are normal.  Musculoskeletal: He exhibits no edema or tenderness.  Neurological: He is alert and oriented.  Sensation diminished to light touch RLE 1/2 Motor: B/l UE 5/5 proximal to distal LLE: 4-/5 HF, KE, 4/5 ADP/PF RLE: 2-/5 HF, KE, 1-2/5 ADP/PF--stable  Skin: Skin is warm and dry. With sterstrips Incision c/d/i  Psychiatric: He has a normal mood and affect. His behavior is normal.   Assessment/Plan: 1. Functional deficits secondary to lumbar stenosis with radiculopathy status post decompression L-2-L5 which require 3+ hours per day of interdisciplinary therapy in a comprehensive inpatient rehab setting. Physiatrist is providing close team supervision and 24 hour management of active medical problems listed  below. Physiatrist and rehab team continue to assess barriers to discharge/monitor patient progress toward functional and medical goals.  Function:  Bathing Bathing position   Position: Wheelchair/chair at sink  Bathing parts Body parts bathed by patient: Right arm, Left arm, Chest, Abdomen, Front perineal area, Right upper leg, Left upper leg, Right lower leg, Left lower leg, Back Body parts bathed by helper: Buttocks  Bathing assist Assist Level: 2 helpers      Upper Body Dressing/Undressing Upper body dressing   What is the patient wearing?: Pull over shirt/dress, Orthosis     Pull over shirt/dress - Perfomed by patient: Thread/unthread right sleeve, Thread/unthread left sleeve, Put head through opening, Pull shirt over trunk       Orthosis activity level: Performed by helper  Upper body assist Assist Level: Supervision or verbal cues, Set up   Set up : To obtain clothing/put away, To apply TLSO, cervical collar  Lower Body Dressing/Undressing Lower body dressing   What is the patient wearing?: Underwear, Pants Underwear - Performed by patient: Thread/unthread right underwear leg, Thread/unthread left underwear leg Underwear - Performed by helper: Pull underwear up/down Pants- Performed by patient: Thread/unthread right pants leg, Thread/unthread left pants leg Pants- Performed by helper: Pull pants up/down   Non-skid slipper socks- Performed by helper: Don/doff right sock, Don/doff left sock       Shoes - Performed by helper: Don/doff  right shoe, Don/doff left shoe, Fasten right, Fasten left          Lower body assist Assist for lower body dressing: 2 Helpers      Toileting Toileting   Toileting steps completed by patient: Adjust clothing prior to toileting Toileting steps completed by helper: Performs perineal hygiene, Adjust clothing after toileting, Adjust clothing prior to toileting Toileting Assistive Devices: Grab bar or rail  Toileting assist      Transfers Chair/bed transfer   Chair/bed transfer method: Stand pivot Chair/bed transfer assist level: dependent (Pt equals 0%) Chair/bed transfer assistive device: Mechanical lift Mechanical lift: Stedy   Locomotion Ambulation Ambulation activity did not occur: Safety/medical concerns         Wheelchair   Type: Manual Max wheelchair distance: 175 Assist Level: Supervision or verbal cues  Cognition Comprehension Comprehension assist level: Follows complex conversation/direction with no assist  Expression Expression assist level: Expresses complex ideas: With no assist  Social Interaction Social Interaction assist level: Interacts appropriately with others - No medications needed.  Problem Solving Problem solving assist level: Solves complex problems: Recognizes & self-corrects  Memory Memory assist level: Complete Independence: No helper    Medical Problem List and Plan: 1.  Decreased functional mobility secondary to lumbar stenosis with radiculopathy status post decompression L-2-L5. Back brace when out of bed  Continue CIR therapies  -team conf today  -will review orthotics with PT for RLE 2.  DVT Prophylaxis/Anticoagulation: SCDs.   Vascular study negative 3. Pain Management: Robaxin and oxycodone as needed  -appears controlled at present  -denies dsyesthesiasat presnt 4. Mood: Provide emotional support 5. Neuropsych: This patient is capable of making decisions on his own behalf. 6. Skin/Wound Care: Routine skin checks. Monitor back incision. 7. Fluids/Electrolytes/Nutrition: encourage PO  -eating well 8. Hypertension. Aldactone 25 mg daily, HCTZ 25 mg daily, Avapro 300 mg daily, Norvasc 5 mg daily.   -labile over weekend  -better control currently  9. Prediabetic. Hemoglobin A1c 5.3. Blood sugar checks discontinued 10. Constipation. Laxative assistance with results   LOS (Days) 3 A FACE TO FACE EVALUATION WAS PERFORMED  SWARTZ,ZACHARY T 12/07/2016 8:36 AM

## 2016-12-08 ENCOUNTER — Inpatient Hospital Stay (HOSPITAL_COMMUNITY): Payer: Managed Care, Other (non HMO) | Admitting: Physical Therapy

## 2016-12-08 ENCOUNTER — Inpatient Hospital Stay (HOSPITAL_COMMUNITY): Payer: Managed Care, Other (non HMO) | Admitting: Occupational Therapy

## 2016-12-08 DIAGNOSIS — R0989 Other specified symptoms and signs involving the circulatory and respiratory systems: Secondary | ICD-10-CM

## 2016-12-08 DIAGNOSIS — R42 Dizziness and giddiness: Secondary | ICD-10-CM

## 2016-12-08 LAB — BASIC METABOLIC PANEL
ANION GAP: 11 (ref 5–15)
BUN: 21 mg/dL — ABNORMAL HIGH (ref 6–20)
CALCIUM: 9.1 mg/dL (ref 8.9–10.3)
CO2: 24 mmol/L (ref 22–32)
Chloride: 102 mmol/L (ref 101–111)
Creatinine, Ser: 1.08 mg/dL (ref 0.61–1.24)
GFR calc non Af Amer: >60 mL/min (ref >60)
GLUCOSE: 82 mg/dL (ref 65–99)
POTASSIUM: 3.6 mmol/L (ref 3.5–5.1)
Sodium: 137 mmol/L (ref 135–145)

## 2016-12-08 LAB — CBC
HEMATOCRIT: 37.2 % — AB (ref 39.0–52.0)
HEMOGLOBIN: 12.4 g/dL — AB (ref 13.0–17.0)
MCH: 28.2 pg (ref 26.0–34.0)
MCHC: 33.3 g/dL (ref 30.0–36.0)
MCV: 84.5 fL (ref 78.0–100.0)
Platelets: 173 10*3/uL (ref 150–400)
RBC: 4.4 MIL/uL (ref 4.22–5.81)
RDW: 14.3 % (ref 11.5–15.5)
WBC: 9.6 10*3/uL (ref 4.0–10.5)

## 2016-12-08 NOTE — Progress Notes (Signed)
Orthopedic Tech Progress Note Patient Details:  [name] 1961/07/12 [id number]  Ortho Devices Type of Ortho Device: Abdominal binder Ortho Device/Splint Location: abdomen Ortho Device/Splint Interventions: Loanne Drilling, Chava Dulac 12/08/2016, 11:31 AM

## 2016-12-08 NOTE — Progress Notes (Signed)
[place] PHYSICAL MEDICINE & REHABILITATION     PROGRESS NOTE  Subjective/Complaints:  Began to feel dizzy when up with OT this morning. Still feels dizzy and a little weak now. Denies cp,cough, sob, fever  ROS: Pt denies fever, rash/itching, headache, blurred or double vision, nausea, vomiting, abdominal pain, diarrhea,   palpitations, dysuria,   neck pain  bleeding, anxiety, or depression    Objective: Vital Signs: Blood pressure (!) 100/55, pulse 71, temperature 100.3 F (37.9 C), temperature source Oral, resp. rate 18, height 6\' 7"  (2.007 m), weight 115.9 kg (255 lb 8 oz), SpO2 98 %. No results found.  Recent Labs  12/06/16 0359  WBC 8.2  HGB 11.3*  HCT 33.6*  PLT 170    Recent Labs  12/06/16 0359  NA 138  K 3.5  CL 102  GLUCOSE 92  BUN 17  CREATININE 0.96  CALCIUM 8.9   CBG (last 3)  No results for input(s): GLUCAP in the last 72 hours.  Wt Readings from Last 3 Encounters:  12/08/16 115.9 kg (255 lb 8 oz)  11/25/16 122.8 kg (270 lb 11.2 oz)  11/03/16 117 kg (258 lb)    Physical Exam:  BP (!) 100/55 (BP Location: Left Arm)   Pulse 71   Temp 100.3 F (37.9 C) (Oral)   Resp 18   Ht 6\' 7"  (2.007 m)   Wt 115.9 kg (255 lb 8 oz)   SpO2 98%   BMI 28.78 kg/m  Constitutional: NAD. Looks a little pale HENT: Normocephalic. Atraumatic Eyes: EOM are normal. No discharge.  Cardiovascular: RRR, no mumurs or rubs Respiratory: clear bilaterally without wheezes or rhonchi GI: Soft. Bowel sounds are normal.  Musculoskeletal: He exhibits no edema or tenderness.  Neurological: He is alert and oriented.  Sensation diminished to light touch RLE 1/2 Motor: B/l UE 5/5 proximal to distal LLE: 4-/5 HF, KE, 4/5 ADP/PF RLE: 2-/5 HF, KE, 1-2/5 ADP/PF--no change Skin: Incision c/d/i with steristrips Psychiatric: He has a normal mood and affect. His behavior is normal.   Assessment/Plan: 1. Functional deficits secondary to lumbar stenosis with radiculopathy status post  decompression L-2-L5 which require 3+ hours per day of interdisciplinary therapy in a comprehensive inpatient rehab setting. Physiatrist is providing close team supervision and 24 hour management of active medical problems listed below. Physiatrist and rehab team continue to assess barriers to discharge/monitor patient progress toward functional and medical goals.  Function:  Bathing Bathing position   Position: Wheelchair/chair at sink  Bathing parts Body parts bathed by patient: Right arm, Left arm, Chest, Abdomen, Front perineal area, Right upper leg, Left upper leg, Right lower leg, Left lower leg, Back Body parts bathed by helper: Buttocks  Bathing assist Assist Level: 2 helpers      Upper Body Dressing/Undressing Upper body dressing   What is the patient wearing?: Pull over shirt/dress     Pull over shirt/dress - Perfomed by patient: Thread/unthread right sleeve, Thread/unthread left sleeve, Put head through opening, Pull shirt over trunk       Orthosis activity level: Performed by patient  Upper body assist Assist Level: Set up   Set up : To obtain clothing/put away, To apply TLSO, cervical collar  Lower Body Dressing/Undressing Lower body dressing   What is the patient wearing?: Underwear, Pants, Non-skid slipper socks Underwear - Performed by patient: Thread/unthread right underwear leg, Thread/unthread left underwear leg Underwear - Performed by helper: Pull underwear up/down Pants- Performed by patient: Thread/unthread right pants leg, Thread/unthread left pants  leg Pants- Performed by helper: Pull pants up/down Non-skid slipper socks- Performed by patient: Don/doff right sock, Don/doff left sock (sok aid) Non-skid slipper socks- Performed by helper: Don/doff right sock, Don/doff left sock       Shoes - Performed by helper: Don/doff right shoe, Don/doff left shoe, Fasten right, Fasten left          Lower body assist Assist for lower body dressing: 2 Helpers       Toileting Toileting   Toileting steps completed by patient: Adjust clothing prior to toileting Toileting steps completed by helper: Performs perineal hygiene, Adjust clothing after toileting, Adjust clothing prior to toileting Toileting Assistive Devices: Grab bar or rail  Toileting assist     Transfers Chair/bed transfer   Chair/bed transfer method: Squat pivot Chair/bed transfer assist level: Supervision or verbal cues Chair/bed transfer assistive device: Armrests Mechanical lift: Stedy   Locomotion Ambulation Ambulation activity did not occur: Safety/medical concerns   Max distance: 8' Assist level: Moderate assist (Pt 50 - 74%)   Wheelchair   Type: Manual Max wheelchair distance: 175 Assist Level: Supervision or verbal cues  Cognition Comprehension Comprehension assist level: Follows complex conversation/direction with no assist  Expression Expression assist level: Expresses complex ideas: With no assist  Social Interaction Social Interaction assist level: Interacts appropriately with others - No medications needed.  Problem Solving Problem solving assist level: Solves complex problems: Recognizes & self-corrects  Memory Memory assist level: Complete Independence: No helper    Medical Problem List and Plan: 1.  Decreased functional mobility secondary to lumbar stenosis with radiculopathy status post decompression L-2-L5. Back brace when out of bed  Continue CIR therapies  -consider right knee brace for stability   2.  DVT Prophylaxis/Anticoagulation: SCDs.   Vascular study negative 3. Pain Management: Robaxin and oxycodone as needed  -appears controlled at present  -denies dsyesthesiasat presnt 4. Mood: Provide emotional support 5. Neuropsych: This patient is capable of making decisions on his own behalf. 6. Skin/Wound Care: Routine skin checks. Monitor back incision. 7. Fluids/Electrolytes/Nutrition: encourage PO  -eating well 8. Hypertension. Aldactone 25 mg  daily, HCTZ 25 mg daily, Avapro 300 mg daily, Norvasc 5 mg daily.   -labile over weekend  -bp's low today (probably associated with symptoms above)  -hold hctz and norvasc  -check labs today  9. Prediabetic. Hemoglobin A1c 5.3. Blood sugar checks discontinued 10. Constipation. Laxative assistance with results   LOS (Days) 4 A FACE TO FACE EVALUATION WAS PERFORMED  Abygayle Deltoro T 12/08/2016 10:23 AM

## 2016-12-08 NOTE — Progress Notes (Signed)
Physical Therapy Session Note  Patient Details  Name: [name] MRN: [id number] Date of Birth: 1961-03-04  Today's Date: 12/08/2016 PT Individual Time: 1100-1200 PT Individual Time Calculation (min): 60 min  PT Concurrent Time: 1030-1100  PT Concurrent Minutes: 30 min    Short Term Goals: Week 1:  PT Short Term Goal 1 (Week 1): Pt will perform bed <> chair transfers with max A PT Short Term Goal 2 (Week 1): Pt will perform gait in controlled environment wiht max A x 10'  Skilled Therapeutic Interventions/Progress Updates:   Tx 1: Pt received supine in bed; denies pain but does report he had gotten back in bed d/t dizziness, agreeable to treatment. Supine>sit with S and increased time with bedrails. Donned TEDs totalA, and requested abdominal binder from RN to A with BP management. Squat pivot transfer bed>w/c with close S, cues for increased hip clearance over wheel. W/c propulsion to gym BUE modI for UE strengthening and endurance. Sit <>stand in parallel bars modA overall; performed x1 trial with BUE on rails, 3 trials with one UE pushing from w/c for carryover into sit <>stand with RW. Standing marching in bars with min guard, cueing/facilitation at R knee for knee control in stance. Squat pivot transfer w/c <>mat table with S. Sit >supine with modA for BLE management. Cues for small bridge to reposition hips on mat. LE exercises for NMR/coordination; hip adduction pillow squeeze AAROM 2x15 reps, short arc quad 2x10 LLE AROM, 4x5 reps RLE AAROM with poor eccentric control and fatigues quickly. Glute sets 2x10 reps, heel slides AAROM RLE with maxislide, AROM LLE with maxislide. W/c propulsion x300' with BUE and modI for strengthening and aerobic endurance. Remained seated in w/c at end of session, all needs in reach.    Therapy Documentation Precautions:  Precautions Precautions: Back, Fall Required Braces or Orthoses: Spinal Brace Spinal Brace: Lumbar corset, Applied in standing  position Restrictions Weight Bearing Restrictions: No Pain: Pain Assessment Pain Assessment: No/denies pain (Pt refused pain meds before therapy. Educated.) Pain Score: 0-No pain   See Function Navigator for Current Functional Status.   Therapy/Group: Individual Therapy  Luberta Mutter 12/08/2016, 7:47 AM

## 2016-12-08 NOTE — Progress Notes (Signed)
Occupational Therapy Session Note  Patient Details  Name: [name] MRN: [id number] Date of Birth: July 08, 1961  Today's Date: 12/08/2016 OT Individual Time: [id number] OT Individual Time Calculation (min): 105 min     Short Term Goals:Week 1:  OT Short Term Goal 1 (Week 1): Pt will complete toilet transfer with 1 helper and LRAD OT Short Term Goal 2 (Week 1): Pt will complete LB dressing with Max A and AE PRN OT Short Term Goal 3 (Week 1): Pt will complete sit<stand for LB ADLs with LRAD and 1 helper OT Short Term Goal 4 (Week 1): Pt will complete bathing with AE and 1 helper  Skilled Therapeutic Interventions/Progress Updates:    Pt seen for OT ADL bathing/dressing session Pt awake in supine upon arrival, agreeable to starting tx session prior to scheduled time. He transferred to EOB using leg lifter to manage R LE and use of bed rails to assist with bringing trunk upright. He transferred via squat pivot with assist to steady equipment into w/c. Grooming/ bathing completed from w/c level with set-up assist using AE to assist for clothing management. Required increased time and effort with max A from therapist to stand in STEADY. Upon standing in STEADY, pt voiced increased dizziness and beocming diaphoretic. BP seated in STEADY 84/59 and re-assessed at 76/44. RN made aware and was present for assessment. With +2 assist to stand from STEADY pt returned to EOB. Upon seated rest break and juice pt voiced feeling back to normal and requesting to cont with therapy. BP re-assessed EOB and 100/55. Seated EOB, he dressed LB using reacher, requiring significantly increased time for proper clothing management, however, demonstrated excellent resillience and desire to return to optimal level of independence. Sock aid used to don socks with min A. From highly elevated bed, pt stood with +2 assist and pants pulled up total A. Following seated rest break, pt completed another sit > stand tolerating ~1 minute  static standing with B UE support. VCs for upright posture and proprioceptive support at B knees. He completed supervision squat pivot into w/c. Pt able to manage w/c leg rests independently. Pt left sitting up in w/c at end of session, set-up with breakfast tray and all needs in reach.    Therapy Documentation Precautions:  Precautions Precautions: Back, Fall Required Braces or Orthoses: Spinal Brace Spinal Brace: Lumbar corset, Applied in standing position Restrictions Weight Bearing Restrictions: No Pain:   No/ denies pain ADL: ADL ADL Comments: Please see functional navigator for ADL status  See Function Navigator for Current Functional Status.   Therapy/Group: Individual Therapy  Lewis, Kervin Bones C 12/08/2016, 6:27 AM

## 2016-12-09 ENCOUNTER — Inpatient Hospital Stay (HOSPITAL_COMMUNITY): Payer: Managed Care, Other (non HMO) | Admitting: Physical Therapy

## 2016-12-09 ENCOUNTER — Inpatient Hospital Stay (HOSPITAL_COMMUNITY): Payer: Managed Care, Other (non HMO) | Admitting: Occupational Therapy

## 2016-12-09 ENCOUNTER — Inpatient Hospital Stay (HOSPITAL_COMMUNITY): Payer: Managed Care, Other (non HMO)

## 2016-12-09 DIAGNOSIS — R7989 Other specified abnormal findings of blood chemistry: Secondary | ICD-10-CM

## 2016-12-09 DIAGNOSIS — I952 Hypotension due to drugs: Secondary | ICD-10-CM

## 2016-12-09 NOTE — Progress Notes (Signed)
[name] PHYSICAL MEDICINE & REHABILITATION     PROGRESS NOTE  Subjective/Complaints:  Feeling better this morning. Had a good night's sleep. Has some soreness around right ribs  ROS: pt denies nausea, vomiting, diarrhea, cough, shortness of breath or chest pain   Objective: Vital Signs: Blood pressure 109/70, pulse 70, temperature 99.4 F (37.4 C), temperature source Oral, resp. rate 17, height 6\' 7"  (2.007 m), weight 115.9 kg (255 lb 8 oz), SpO2 98 %. No results found.  Recent Labs  12/08/16 1146  WBC 9.6  HGB 12.4*  HCT 37.2*  PLT 173    Recent Labs  12/08/16 1146  NA 137  K 3.6  CL 102  GLUCOSE 82  BUN 21*  CREATININE 1.08  CALCIUM 9.1   CBG (last 3)  No results for input(s): GLUCAP in the last 72 hours.  Wt Readings from Last 3 Encounters:  12/08/16 115.9 kg (255 lb 8 oz)  11/25/16 122.8 kg (270 lb 11.2 oz)  11/03/16 117 kg (258 lb)    Physical Exam:  BP 109/70 (BP Location: Left Arm)   Pulse 70   Temp 99.4 F (37.4 C) (Oral)   Resp 17   Ht 6\' 7"  (2.007 m)   Wt 115.9 kg (255 lb 8 oz)   SpO2 98%   BMI 28.78 kg/m  Constitutional: NAD. Looks a little pale HENT: Normocephalic. Atraumatic Eyes: EOM are normal. No discharge.  Cardiovascular: RRR Respiratory: clear bilaterally without wheezes or rhonchi GI: Soft. Bowel sounds are normal.  Musculoskeletal: trace LE edema.  Neurological: He is alert and oriented.  Sensation diminished to light touch RLE 1/2 Motor: B/l UE 5/5 proximal to distal LLE: 4-/5 HF, KE, 4/5 ADP/PF RLE: 2-/5 HF, KE, 1-2/5 ADP/PF--no change Skin: wound clean  Psychiatric: He has a normal mood and affect. His behavior is normal.   Assessment/Plan: 1. Functional deficits secondary to lumbar stenosis with radiculopathy status post decompression L-2-L5 which require 3+ hours per day of interdisciplinary therapy in a comprehensive inpatient rehab setting. Physiatrist is providing close team supervision and 24 hour management of  active medical problems listed below. Physiatrist and rehab team continue to assess barriers to discharge/monitor patient progress toward functional and medical goals.  Function:  Bathing Bathing position   Position: Wheelchair/chair at sink  Bathing parts Body parts bathed by patient: Right arm, Left arm, Chest, Abdomen, Front perineal area, Right upper leg, Left upper leg, Right lower leg, Left lower leg, Back Body parts bathed by helper: Buttocks  Bathing assist Assist Level: 2 helpers      Upper Body Dressing/Undressing Upper body dressing   What is the patient wearing?: Pull over shirt/dress     Pull over shirt/dress - Perfomed by patient: Thread/unthread right sleeve, Thread/unthread left sleeve, Put head through opening, Pull shirt over trunk       Orthosis activity level: Performed by patient  Upper body assist Assist Level: Set up   Set up : To obtain clothing/put away, To apply TLSO, cervical collar  Lower Body Dressing/Undressing Lower body dressing   What is the patient wearing?: Underwear, Pants, Non-skid slipper socks Underwear - Performed by patient: Thread/unthread right underwear leg, Thread/unthread left underwear leg Underwear - Performed by helper: Pull underwear up/down Pants- Performed by patient: Thread/unthread right pants leg, Thread/unthread left pants leg Pants- Performed by helper: Pull pants up/down Non-skid slipper socks- Performed by patient: Don/doff right sock, Don/doff left sock (sok aid) Non-skid slipper socks- Performed by helper: Don/doff right sock, Don/doff left  sock       Shoes - Performed by helper: Don/doff right shoe, Don/doff left shoe, Fasten right, Fasten left          Lower body assist Assist for lower body dressing: 2 Helpers      Toileting Toileting   Toileting steps completed by patient: Adjust clothing prior to toileting Toileting steps completed by helper: Performs perineal hygiene, Adjust clothing after toileting,  Adjust clothing prior to toileting Toileting Assistive Devices: Grab bar or rail  Toileting assist     Transfers Chair/bed transfer   Chair/bed transfer method: Squat pivot Chair/bed transfer assist level: Supervision or verbal cues Chair/bed transfer assistive device: Armrests Mechanical lift: Stedy   Locomotion Ambulation Ambulation activity did not occur: Safety/medical concerns   Max distance: 8' Assist level: Moderate assist (Pt 50 - 74%)   Wheelchair   Type: Manual Max wheelchair distance: 175 Assist Level: No help, No cues, assistive device, takes more than reasonable amount of time  Cognition Comprehension Comprehension assist level: Follows complex conversation/direction with no assist  Expression Expression assist level: Expresses complex ideas: With no assist  Social Interaction Social Interaction assist level: Interacts appropriately with others - No medications needed.  Problem Solving Problem solving assist level: Solves complex problems: Recognizes & self-corrects  Memory Memory assist level: Complete Independence: No helper    Medical Problem List and Plan: 1.  Decreased functional mobility secondary to lumbar stenosis with radiculopathy status post decompression L-2-L5. Back brace when out of bed  Continue CIR therapies  -? right knee brace for stability   2.  DVT Prophylaxis/Anticoagulation: SCDs.   Vascular study negative 3. Pain Management: Robaxin and oxycodone as needed  -appears controlled at present  -denies dsyesthesias  4. Mood: Provide emotional support 5. Neuropsych: This patient is capable of making decisions on his own behalf. 6. Skin/Wound Care: Routine skin checks. Monitor back incision. 7. Fluids/Electrolytes/Nutrition: encourage PO  -BUN sl elevated-    -recheck Friday 8. Hypertension. (Aldactone 25 mg daily, HCTZ 25 mg daily----held), Avapro 300 mg daily, Norvasc 5 mg daily.   -bp's a little better this am  -suspect he is a little  volume depleted  -continue to hold hctz and norvasc  -push po fluids  9. Prediabetic. Hemoglobin A1c 5.3. Blood sugar checks discontinued 10. Constipation. Laxative assistance with results   LOS (Days) 5 A FACE TO FACE EVALUATION WAS PERFORMED  [name] T 12/09/2016 8:20 AM

## 2016-12-09 NOTE — Progress Notes (Signed)
Occupational Therapy Session Note  Patient Details  Name: [name] MRN: [id number] Date of Birth: 10-Sep-1961  Today's Date: 12/09/2016 OT Individual Time: 1400-1500 OT Individual Time Calculation (min): 60 min    Short Term Goals: Week 1:  OT Short Term Goal 1 (Week 1): Pt will complete toilet transfer with 1 helper and LRAD OT Short Term Goal 2 (Week 1): Pt will complete LB dressing with Max A and AE PRN OT Short Term Goal 3 (Week 1): Pt will complete sit<stand for LB ADLs with LRAD and 1 helper OT Short Term Goal 4 (Week 1): Pt will complete bathing with AE and 1 helper  Skilled Therapeutic Interventions/Progress Updates:    Pt seen for OT session focusing on functional standing balance and LE strengthening. Pt received in w/c having just finished toilet transfer with NT. Pt voicing increased fatigue, however, willing to attempt therapy. He self propelled w/c to therapy gym mod I. Squat pivots completed throughout session with supervision and increased time.  He completed x2 sit <> stands from elevated EOM, requiring heavy min A to stand. VCs for upright posture and to relax UEs as pt with heavy reliance on RW during static standing. Worked on pt transitioning his L UE from RW to elevated table next to him as progression for less UE reliance in prep for functional standing tasks. Pt tolerated ~30 seconds of standing before requiring seated rest break.  He transitioned to supine on mat with assist for management of LEs. In supine, pt completed assisted SAQ with use of maxislide under feet, increased assist required for R>L,  Completed x2 trials, 10 reps on L, 5 on R each trial due to fatigue. Worked on hip isolation with hip AB/ADuction and control, increased control L>R. Attempted to have pt squeeze ball btwn knees with knees flexed from supine position, however, due to fatigue, pt unable.  He transferred back to EOM with max A . Pt returned to room at end of session, opted to stay up in  w/c, left with all needs in reach.   Therapy Documentation Precautions:  Precautions Precautions: Back, Fall Required Braces or Orthoses: Spinal Brace Spinal Brace: Lumbar corset, Applied in standing position Restrictions Weight Bearing Restrictions: No Pain:   No/ denies pain ADL: ADL ADL Comments: Please see functional navigator for ADL status  See Function Navigator for Current Functional Status.   Therapy/Group: Individual Therapy  Lewis, Kynlie Jane C 12/09/2016, 3:16 PM

## 2016-12-09 NOTE — Progress Notes (Signed)
Occupational Therapy Session Note  Patient Details  Name: [name] MRN: [id number] Date of Birth: 1961-01-22  Today's Date: 12/09/2016 OT Individual Time: 0900-1000 OT Individual Time Calculation (min): 60 min    Short Term Goals: Week 1:  OT Short Term Goal 1 (Week 1): Pt will complete toilet transfer with 1 helper and LRAD OT Short Term Goal 2 (Week 1): Pt will complete LB dressing with Max A and AE PRN OT Short Term Goal 3 (Week 1): Pt will complete sit<stand for LB ADLs with LRAD and 1 helper OT Short Term Goal 4 (Week 1): Pt will complete bathing with AE and 1 helper  Skilled Therapeutic Interventions/Progress Updates:    Pt resting in bed upon arrival.  Pt sat EOB with supervision using leg lifter and requiring more than a reasonable amount of time to complete task.  Pt performed squat/scoot transfer to w/c to engage in bathing tasks.  Pt used Stedy with mod A to stand to remove pants and wash periarea.  Pt returned to bed to complete dressing tasks.  Pt used reacher to thread underwear and pants.  Pt pulled up pants utilizing lateral leans. Pt required assistance donning Ted hose and shoes secondary to time constraints. Focus on activity tolerance, functional transfers, BADL retraining, and safety awareness to increase independence with BADLs.   Therapy Documentation Precautions:  Precautions Precautions: Back, Fall Required Braces or Orthoses: Spinal Brace Spinal Brace: Lumbar corset, Applied in standing position Restrictions Weight Bearing Restrictions: No     Pain:  Pt denied pain  See Function Navigator for Current Functional Status.   Therapy/Group: Individual Therapy  Leroy Libman 12/09/2016, 2:30 PM

## 2016-12-09 NOTE — Patient Care Conference (Signed)
Inpatient RehabilitationTeam Conference and Plan of Care Update Date: 12/07/2016   Time: 2:15 PM    Patient Name: [name]      Medical Record Number: [id number]  Date of Birth: September 30, 1961 Sex: Male         Room/Bed: 4M10C/4M10C-01 Payor Info: Payor: [employer] / Plan: Market researcher / Product Type: *No Product type* /    Admitting Diagnosis: Thoraic Myelopathy  Admit Date/Time:  12/04/2016  3:49 PM Admission Comments: No comment available   Primary Diagnosis:  Lumbar radiculopathy Principal Problem: Lumbar radiculopathy  Patient Active Problem List   Diagnosis Date Noted  . Prerenal azotemia 12/09/2016  . Lumbar radiculopathy 12/04/2016  . Surgery, elective   . Weakness of both legs   . Post-operative pain   . Benign essential HTN   . Prediabetes   . Constipation due to pain medication   . Back pain 12/01/2016  . Bradycardia 07/17/2014  . Syncope 07/15/2014    Expected Discharge Date: Expected Discharge Date: 12/28/16  Team Members Present: Physician leading conference: Dr. Alger Simons Social Worker Present: Lennart Pall, LCSW Nurse Present: Dorien Chihuahua, RN PT Present: Kem Parkinson, PT OT Present: Napoleon Form, Artemio Aly, OT SLP Present: Weston Anna, SLP PPS Coordinator present : Daiva Nakayama, RN, CRRN     Current Status/Progress Goal Weekly Team Focus  Medical   lumbar radiculopathy due to stenosis s/p decompression and fusion. pain issues. persistent leg weakness  improve functional mobviltiy  pain, wound care, back precautions   Bowel/Bladder   Continent of bowel and bladder. LBM 12/04/16  Monitor   Establish a regular bowel pattern   Swallow/Nutrition/ Hydration             ADL's   Mod A LB dressing using AE, Set-up UB bathing/dressing and grooming; Max A from STEADY transfers, min A sliding board transfers  Supervision-min A overall  ADL re-training with AE, Functional transfers, neuro re-ed, activity tolerance   Mobility   maxA sit <>stand in  stedy, S squat pivot and slideboard transfers, gait NT  minA bed mobility, car transfers, stairs; S w/c <>bed transfers, ambulation  LE strengthening/NMR, transfer training, pre-gait activities   Communication             Safety/Cognition/ Behavioral Observations            Pain   No c/o pain  <3  Montior for nonverbal cues of pain   Skin   Lumbar incision with steri and visible sutures to distal end of incision line  No additional skin breakdown  Assess q shift    Rehab Goals Patient on target to meet rehab goals: Yes *See Care Plan and progress notes for long and short-term goals.  Barriers to Discharge: pain, need for brace, persistent leg weakness    Possible Resolutions to Barriers:  orthotic education/mgt, continued strength and NMR    Discharge Planning/Teaching Needs:  Plan home with wife and daughters who can provide any needed assistance.  Teaching to be scheduled with family.   Team Discussion:  MD notes pt may need orthotic for right side - monitor.  Knee instability/ ?knee brace.  No nsg issues.  Standing easier from a higher surface given patient's height.  Goals at supervision amb and min assist stairs.  Mostly supervision overall.  Revisions to Treatment Plan:  None   Continued Need for Acute Rehabilitation Level of Care: The patient requires daily medical management by a physician with specialized training in physical medicine and rehabilitation for  the following conditions: Daily direction of a multidisciplinary physical rehabilitation program to ensure safe treatment while eliciting the highest outcome that is of practical value to the patient.: Yes Daily medical management of patient stability for increased activity during participation in an intensive rehabilitation regime.: Yes Daily analysis of laboratory values and/or radiology reports with any subsequent need for medication adjustment of medical intervention for : Post surgical problems;Neurological  problems  Reuel Lamadrid 12/10/2016, 9:17 AM

## 2016-12-09 NOTE — Progress Notes (Signed)
    Subjective:    Patient reports pain as 0 on 0-10 scale.   Denies CP or SOB.  Voiding without difficulty. Positive flatus. Pt has been working with PT in Inpatient rehab.  The pt says he is standing on his own.  They have been working on pivoting.  He is still not ambulating because of right leg weakness.  Pt denies leg pain or incisional pain.  Objective: Vital signs in last 24 hours: Temp:  [98.9 F (37.2 C)-99.4 F (37.4 C)] 99.4 F (37.4 C) (01/18 0523) Pulse Rate:  [70] 70 (01/18 0523) Resp:  [17-18] 17 (01/18 0523) BP: (109-115)/(60-70) 109/70 (01/18 0523) SpO2:  [98 %-99 %] 98 % (01/18 0523)  Intake/Output from previous day: 01/17 0701 - 01/18 0700 In: 720 [P.O.:720] Out: 1650 [Urine:1650] Intake/Output this shift: Total I/O In: 480 [P.O.:480] Out: -   Labs:  Recent Labs  12/08/16 1146  HGB 12.4*    Recent Labs  12/08/16 1146  WBC 9.6  RBC 4.40  HCT 37.2*  PLT 173    Recent Labs  12/08/16 1146  NA 137  K 3.6  CL 102  CO2 24  BUN 21*  CREATININE 1.08  GLUCOSE 82  CALCIUM 9.1   No results for input(s): LABPT, INR in the last 72 hours.  Physical Exam: Neurologically intact ABD soft Sensation intact distally Incision: no drainage Compartment soft  Assessment/Plan:    Pt will continue to be at Inpatient Rehab Current scheduled Rehab is 2/6 Myself or Dr. Rolena Infante will come by next week to take out sutures.  Lavana Huckeba, Darla Lesches for Dr. Melina Schools [facility] [phone] 12/09/2016, 2:18 PM    [name], male   DOB: 12-18-1960, 56 y.o.   MRN: [id number]

## 2016-12-09 NOTE — Progress Notes (Signed)
Physical Therapy Session Note  Patient Details  Name: [name] MRN: [id number] Date of Birth: 03/14/1961  Today's Date: 12/09/2016 PT Individual Time: 1045-1200 PT Individual Time Calculation (min): 75 min   Short Term Goals: Week 1:  PT Short Term Goal 1 (Week 1): Pt will perform bed <> chair transfers with max A PT Short Term Goal 2 (Week 1): Pt will perform gait in controlled environment wiht max A x 10'  Skilled Therapeutic Interventions/Progress Updates: Pt received seated in w/c, denies pain but does c/o R abdominal "muscle soreness" and agreeable to treatment. W/c propulsion to/from gym with BUE and modI for BUE strengthening and aerobic endurance. Squat pivot transfer w/c <>mat table with close S. Sit <>stand and standing marching x3 trials with min guard; table elevated for sit <>stand due to LE strength deficits. Stand pivot transfer w/c <>mat table with modA for RLE stance control and facilitation for glute activation/hip extension. Sit <>stand 2x5 reps modA from elevated table. Squat pivot transfer w/c <>nustep with minA due to larger gap between chair and target. Performed BLE nustep, RLE neutral hip attachment x8 min total for BLE coordination and NMR. Several rest breaks required due to fatigue. W/c propulsion to return to room with modI. Remained seated in w/c at end of session, all needs in reach.      Therapy Documentation Precautions:  Precautions Precautions: Back, Fall Required Braces or Orthoses: Spinal Brace Spinal Brace: Lumbar corset, Applied in standing position Restrictions Weight Bearing Restrictions: No  See Function Navigator for Current Functional Status.   Therapy/Group: Individual Therapy  Luberta Mutter 12/09/2016, 11:57 AM

## 2016-12-09 NOTE — Progress Notes (Signed)
Social Work [name], male   DOB: [birth year], 56 y.o.   MRN: [id number]   Met with pt following team conference.  He is aware and agreeable with targeted d/c date of 2/6, however, hopeful that he may be ready sooner.  He is very motivated for therapies and eager to reach goals of supervision/ min assist.  Continue to follow.  Kristoffer Bala, LCSW

## 2016-12-10 ENCOUNTER — Inpatient Hospital Stay (HOSPITAL_COMMUNITY): Payer: Managed Care, Other (non HMO) | Admitting: Physical Therapy

## 2016-12-10 ENCOUNTER — Inpatient Hospital Stay (HOSPITAL_COMMUNITY): Payer: Managed Care, Other (non HMO) | Admitting: Occupational Therapy

## 2016-12-10 LAB — BASIC METABOLIC PANEL
Anion gap: 10 (ref 5–15)
BUN: 19 mg/dL (ref 6–20)
CHLORIDE: 102 mmol/L (ref 101–111)
CO2: 27 mmol/L (ref 22–32)
CREATININE: 0.92 mg/dL (ref 0.61–1.24)
Calcium: 9 mg/dL (ref 8.9–10.3)
GFR calc non Af Amer: >60 mL/min (ref >60)
Glucose, Bld: 89 mg/dL (ref 65–99)
Potassium: 3.8 mmol/L (ref 3.5–5.1)
Sodium: 139 mmol/L (ref 135–145)

## 2016-12-10 MED ORDER — AMLODIPINE BESYLATE 5 MG PO TABS
5.0000 mg | ORAL_TABLET | Freq: Every day | ORAL | Status: DC
  Administered 2016-12-10 – 2016-12-19 (×10): 5 mg via ORAL
  Filled 2016-12-10 (×11): qty 1

## 2016-12-10 NOTE — Progress Notes (Signed)
Occupational Therapy Session Note  Patient Details  Name: [name] MRN: [id number] Date of Birth: [birth year]  Today's Date: 12/10/2016 OT Individual Time: 1005-1105 OT Individual Time Calculation (min): 60 min    Short Term Goals: Week 1:  OT Short Term Goal 1 (Week 1): Pt will complete toilet transfer with 1 helper and LRAD OT Short Term Goal 2 (Week 1): Pt will complete LB dressing with Max A and AE PRN OT Short Term Goal 3 (Week 1): Pt will complete sit<stand for LB ADLs with LRAD and 1 helper OT Short Term Goal 4 (Week 1): Pt will complete bathing with AE and 1 helper  Skilled Therapeutic Interventions/Progress Updates:    Pt seen for OT ADL bathing/dressing session. Pt in supine resting upon arrival, agreeable to tx session and bathing from shower level. Pt transitioned to EOB with assist for management of R LE to advance off EOB. Squat pivot completed to w/c with assist to steady equipment. He completed supervision transfer to [facility] placed in shower with heavy reliance on grab bars. Pt able to stand from standard height BSC with use of grab bars for total A to pull pants down prior to shower, a great improvement from previous sessions. He bathed seated with set-up assist. Pt stood with use of grab bars for BSC to be replaced with w/c when exiting shower, tolerating ~15 seconds of static standing at grab bar.  He dressed seated in w/c using reacher with set-up and increased time, assist for TEDs and shoes. He stood with +2 assist from w/c and +2 required to pull pants up while pt maintained balance through B UEs when standing at RW.  Pt left seated in w/c at end of session, nurse present administering medications.    Therapy Documentation Precautions:  Precautions Precautions: Back, Fall Required Braces or Orthoses: Spinal Brace Spinal Brace: Lumbar corset, Applied in standing position Restrictions Weight Bearing Restrictions: No Pain:   1/10; shower, repositioned ADL: ADL ADL  Comments: Please see functional navigator for ADL status  See Function Navigator for Current Functional Status.   Therapy/Group: Individual Therapy  Lewis, Emmanuel Gruenhagen C 12/10/2016, 7:09 AM

## 2016-12-10 NOTE — Progress Notes (Signed)
[facility]ICAL MEDICINE & REHABILITATION     PROGRESS NOTE  Subjective/Complaints:  No problems yesterday. Notes some tingling/burning in right leg---no overly painful. Denies dizziness  ROS: pt denies nausea, vomiting, diarrhea, cough, shortness of breath or chest pain    Objective: Vital Signs: Blood pressure (!) 142/79, pulse 73, temperature 98.7 F (37.1 C), temperature source Oral, resp. rate 18, height 6\' 7"  (2.007 m), weight 115.9 kg (255 lb 8 oz), SpO2 99 %. No results found.  Recent Labs  12/08/16 1146  WBC 9.6  HGB 12.4*  HCT 37.2*  PLT 173    Recent Labs  12/08/16 1146 12/10/16 0519  NA 137 139  K 3.6 3.8  CL 102 102  GLUCOSE 82 89  BUN 21* 19  CREATININE 1.08 0.92  CALCIUM 9.1 9.0   CBG (last 3)  No results for input(s): GLUCAP in the last 72 hours.  Wt Readings from Last 3 Encounters:  12/08/16 115.9 kg (255 lb 8 oz)  11/25/16 122.8 kg (270 lb 11.2 oz)  11/03/16 117 kg (258 lb)    Physical Exam:  BP (!) 142/79 (BP Location: Left Arm)   Pulse 73   Temp 98.7 F (37.1 C) (Oral)   Resp 18   Ht 6\' 7"  (2.007 m)   Wt 115.9 kg (255 lb 8 oz)   SpO2 99%   BMI 28.78 kg/m  Constitutional: NAD. Looks a little pale HENT: Normocephalic. Atraumatic Eyes: EOM are normal. No discharge.  Cardiovascular: RRR Respiratory: clear GI: Soft. Bowel sounds are normal.  Musculoskeletal: trace LE edema.  Neurological: He is alert and oriented.  Sensation diminished to light touch RLE 1/2 Motor: B/l UE 5/5 proximal to distal LLE: 4-/5 HF, KE, 4/5 ADP/PF RLE: 2-/5 HF, KE, 1-2/5 ADP/PF--stable. Skin: wound clean  Psychiatric: He has a normal mood and affect. His behavior is normal.   Assessment/Plan: 1. Functional deficits secondary to lumbar stenosis with radiculopathy status post decompression L-2-L5 which require 3+ hours per day of interdisciplinary therapy in a comprehensive inpatient rehab setting. Physiatrist is providing close team supervision and  24 hour management of active medical problems listed below. Physiatrist and rehab team continue to assess barriers to discharge/monitor patient progress toward functional and medical goals.  Function:  Bathing Bathing position   Position: Wheelchair/chair at sink  Bathing parts Body parts bathed by patient: Right arm, Left arm, Chest, Abdomen, Front perineal area, Right upper leg, Left upper leg, Right lower leg, Left lower leg, Back Body parts bathed by helper: Buttocks  Bathing assist Assist Level: 2 helpers      Upper Body Dressing/Undressing Upper body dressing   What is the patient wearing?: Pull over shirt/dress     Pull over shirt/dress - Perfomed by patient: Thread/unthread right sleeve, Thread/unthread left sleeve, Put head through opening, Pull shirt over trunk       Orthosis activity level: Performed by patient  Upper body assist Assist Level: Set up   Set up : To obtain clothing/put away  Lower Body Dressing/Undressing Lower body dressing   What is the patient wearing?: Pants, [employer], Shoes Underwear - Performed by patient: Thread/unthread right underwear leg, Thread/unthread left underwear leg, Pull underwear up/down (lateral leans) Underwear - Performed by helper: Pull underwear up/down Pants- Performed by patient: Thread/unthread right pants leg, Thread/unthread left pants leg Pants- Performed by helper: Pull pants up/down Non-skid slipper socks- Performed by patient: Don/doff right sock, Don/doff left sock (sok aid) Non-skid slipper socks- Performed by helper: Don/doff right  sock, Don/doff left sock       Shoes - Performed by helper: Don/doff right shoe, Don/doff left shoe, Fasten right, Fasten left       TED Hose - Performed by helper: Don/doff right TED hose, Don/doff left TED hose  Lower body assist Assist for lower body dressing: Touching or steadying assistance (Pt > 75%)      Toileting Toileting   Toileting steps completed by patient: Adjust  clothing prior to toileting Toileting steps completed by helper: Adjust clothing prior to toileting, Performs perineal hygiene, Adjust clothing after toileting (per Delana Meyer, Adger, NT report) Geologist, engineering Devices: Grab bar or rail  Toileting assist Assist level: Touching or steadying assistance (Pt.75%) (per [employer], NT report)   Transfers Chair/bed transfer   Chair/bed transfer method: Stand pivot Chair/bed transfer assist level: Moderate assist (Pt 50 - 74%/lift or lower) Chair/bed transfer assistive device: Armrests Mechanical lift: Stedy   Locomotion Ambulation Ambulation activity did not occur: Safety/medical concerns   Max distance: 8' Assist level: Moderate assist (Pt 50 - 74%)   Wheelchair   Type: Manual Max wheelchair distance: 175 Assist Level: No help, No cues, assistive device, takes more than reasonable amount of time  Cognition Comprehension Comprehension assist level: Follows complex conversation/direction with no assist  Expression Expression assist level: Expresses complex ideas: With no assist  Social Interaction Social Interaction assist level: Interacts appropriately with others - No medications needed.  Problem Solving Problem solving assist level: Solves complex problems: Recognizes & self-corrects  Memory Memory assist level: Complete Independence: No helper    Medical Problem List and Plan: 1.  Decreased functional mobility secondary to lumbar stenosis with radiculopathy status post decompression L-2-L5. Back brace when out of bed  Continue CIR therapies  -consider right knee brace for stability   2.  DVT Prophylaxis/Anticoagulation: SCDs.   Vascular study negative 3. Pain Management: Robaxin and oxycodone as needed  -appears controlled at present  -has mild dysesthesias RLE--no rx needed at present 4. Mood: Provide emotional support 5. Neuropsych: This patient is capable of making decisions on his own behalf. 6. Skin/Wound Care: Routine  skin checks. Monitor back incision. 7. Fluids/Electrolytes/Nutrition: encourage PO  -BUN with improvement today  -I personally reviewed the patient's labs today.  8. Hypertension. (Aldactone 25 mg daily, HCTZ 25 mg daily----held), Avapro 300 mg daily, Norvasc 5 mg daily.   -bp's improved.  -resume norvasc   -continue to hold hctz    -push po fluids  9. Prediabetic. Hemoglobin A1c 5.3. Blood sugar checks discontinued 10. Constipation. Laxative assistance with results   LOS (Days) 6 A FACE TO FACE EVALUATION WAS PERFORMED  Toniann Dickerson T 12/10/2016 9:25 AM

## 2016-12-10 NOTE — Progress Notes (Signed)
Physical Therapy Session Note  Patient Details  Name: [name] MRN: [id number] Date of Birth: [birth year]  Today's Date: 12/10/2016 PT Individual Time: [id number] and 1115-1200 and 1455-1555 PT Individual Time Calculation (min): 30 min and 45 min and 60 min (total 135 min)   Short Term Goals: Week 1:  PT Short Term Goal 1 (Week 1): Pt will perform bed <> chair transfers with max A PT Short Term Goal 2 (Week 1): Pt will perform gait in controlled environment wiht max A x 10'  Skilled Therapeutic Interventions/Progress Updates: Tx 1: Pt received supine in bed, c/o pain 1/10 in R posterior thigh and agreeable to treatment. Supine>sit on EOB with minA for RLE management which pt reports is due to "not being warmed up yet". Pt dons shirt and back brace with setupA. Pants donned minA; threads BLEs with grabber. Sit <>stand x5 trials before successful full stand with maxA and bed gradually elevated to increase success; therapist then pulled up pants due to heavy reliance on UEs. Pt requests to remain seated on EOB at end of session; verbalizes understanding to call for assist before transferring to chair, all needs in reach.   Tx 2: Pt received seated at sink in room finishing grooming; denies pain and agreeable to treatment. W/c propulsion to gym modI x175'. Squat pivot transfer w/c >mat table with S. Sit <>stand x4 reps from 23" mat height. X4 reps from 21" seat height; maxA overall for boosting to stand due to significant LE strength deficits and heavy reliance on UEs. Final 4 reps from seat height elevated to 25" for focus on terminal hip/knee extension in stance, minA. Seated long arc quads x10 reps each side. LAQ with ball kicks alternating LEs; required table elevated to bring feet off ground d/t hip flexion strength deficits. Returned to w/c squat pivot with S. Propelled to room at end of session with modI and remained seated in w/c with all needs in reach.   Tx 3: Pt received seated in w/c,  denies pain and agreeable to treatment. Squat pivot transfer w/c <>nustep with min guard. Performed nustep x9 in total with BLE level 3; several rest breaks due to fatigue and RLE hip alignment attachment. Car transfer performed squat pivot with min guard and cues for technique. Sit <>stand x3 in stedy with mod/maxA from w/c level, minA from stedy seat. Attempted performance of LE w/c propulsion forward/backward for LE strengthening; requires increased time and occasional assist from UEs to place foot. Remained seated in w/c at end of session, all needs in reach.      Therapy Documentation Precautions:  Precautions Precautions: Back, Fall Required Braces or Orthoses: Spinal Brace Spinal Brace: Lumbar corset, Applied in standing position Restrictions Weight Bearing Restrictions: No   See Function Navigator for Current Functional Status.   Therapy/Group: Individual Therapy  Luberta Mutter 12/10/2016, 8:28 AM

## 2016-12-11 ENCOUNTER — Inpatient Hospital Stay (HOSPITAL_COMMUNITY): Payer: Managed Care, Other (non HMO) | Admitting: Occupational Therapy

## 2016-12-11 NOTE — Progress Notes (Signed)
Occupational Therapy Session Note  Patient Details  Name: [name] MRN: [id number] Date of Birth: [birth year]  Today's Date: 12/11/2016 OT Individual Time: [id number] OT Individual Time Calculation (min): 45 min    Short Term Goals: Week 1:  OT Short Term Goal 1 (Week 1): Pt will complete toilet transfer with 1 helper and LRAD OT Short Term Goal 2 (Week 1): Pt will complete LB dressing with Max A and AE PRN OT Short Term Goal 3 (Week 1): Pt will complete sit<stand for LB ADLs with LRAD and 1 helper OT Short Term Goal 4 (Week 1): Pt will complete bathing with AE and 1 helper  Skilled Therapeutic Interventions/Progress Updates: participation today as follows:  supine with Head and bed elevated to edge of bed transfer=mod assist for left leg and extra time  EOB to w/c via left side scoot= extra time and close S  UB bathing and dressing =set up LB bathing and dressing = Moderate assistance (boosted up in chair while clinician washed buttocks and pulled up pants) (patient did not want to try lateral leans to wash buttocks.       Patient used reacher to help don pants & he elected not to wash feet or change from tredded socks this session     Therapy Documentation Precautions:  Precautions Precautions: Back, Fall Required Braces or Orthoses: Spinal Brace Spinal Brace: Lumbar corset, Applied in standing position Restrictions Weight Bearing Restrictions: No Pain:denied    See Function Navigator for Current Functional Status.   Therapy/Group: Individual Therapy  Alfredia Ferguson [facility] 12/11/2016, 7:03 PM

## 2016-12-11 NOTE — Progress Notes (Signed)
Occupational Therapy Session Note  Patient Details  Name: [name] MRN: [id number] Date of Birth: 08/04/1961  Today's Date: 12/11/2016 OT Individual Time: 1740-1810 OT Individual Time Calculation (min): 30 min    Short Term Goals: Week 1:  OT Short Term Goal 1 (Week 1): Pt will complete toilet transfer with 1 helper and LRAD OT Short Term Goal 2 (Week 1): Pt will complete LB dressing with Max A and AE PRN OT Short Term Goal 3 (Week 1): Pt will complete sit<stand for LB ADLs with LRAD and 1 helper OT Short Term Goal 4 (Week 1): Pt will complete bathing with AE and 1 helper  Skilled Therapeutic Interventions/Progress Updates: Patient completed endurance activities wheelchair level but c/o too fatigued to work on lateral leansin wheel chair  while maintaiing BAT back precautions.     Patient would benefit from more opportunities to wash periarea/buttocks w/c level since at home he will have to bathe initially in 1st level bathroom sink  Call [employer] regarding patient velcro not sticking and therefore his lumbar brace not fitting properly (too loose), but [employer] suggested the black neoprene brace was provided from patient's orthopedic surgeon (Dr. Rolena Infante).   Upon further discussion, patient added that "yes" Dr. Tresa Moore office provided this brace and the prior one that was too small.         Patient stated he will have his wife call Dr. Rolena Infante office and inform that the velcro on this brace does not stick properly and thereby causing illl fit and ill support.    Paitnet left sitting upright in his w/c with two supportive daughters nearby.     Therapy Documentation Precautions:  Precautions Precautions: Back, Fall Required Braces or Orthoses: Spinal Brace Spinal Brace: Lumbar corset, Applied in standing position Restrictions Weight Bearing Restrictions: No   Pain:denied    See Function Navigator for Current Functional Status.   Therapy/Group: Individual Therapy  Alfredia Ferguson  [facility] 12/11/2016, 7:08 PM

## 2016-12-11 NOTE — Progress Notes (Signed)
[place] PHYSICAL MEDICINE & REHABILITATION     PROGRESS NOTE  Subjective/Complaints:  Feeling well. No progression of right leg symptoms.   ROS: pt denies nausea, vomiting, diarrhea, cough, shortness of breath or chest pain     Objective: Vital Signs: Blood pressure 122/70, pulse 70, temperature 99.1 F (37.3 C), temperature source Oral, resp. rate 20, height 6\' 7"  (2.007 m), weight 115.9 kg (255 lb 8 oz), SpO2 98 %. No results found.  Recent Labs  12/08/16 1146  WBC 9.6  HGB 12.4*  HCT 37.2*  PLT 173    Recent Labs  12/08/16 1146 12/10/16 0519  NA 137 139  K 3.6 3.8  CL 102 102  GLUCOSE 82 89  BUN 21* 19  CREATININE 1.08 0.92  CALCIUM 9.1 9.0   CBG (last 3)  No results for input(s): GLUCAP in the last 72 hours.  Wt Readings from Last 3 Encounters:  12/08/16 115.9 kg (255 lb 8 oz)  11/25/16 122.8 kg (270 lb 11.2 oz)  11/03/16 117 kg (258 lb)    Physical Exam:  BP 122/70 (BP Location: Left Arm)   Pulse 70   Temp 99.1 F (37.3 C) (Oral)   Resp 20   Ht 6\' 7"  (2.007 m)   Wt 115.9 kg (255 lb 8 oz)   SpO2 98%   BMI 28.78 kg/m  Constitutional: NAD. Looks a little pale HENT: Normocephalic. Atraumatic Eyes: EOM are normal. No discharge.  Cardiovascular:RRR Respiratory: clear GI: Soft. Bowel sounds are normal.  Musculoskeletal: trace LE edema.  Neurological: He is alert and oriented.  Sensation diminished to light touch RLE 1 to 1+/2 Motor: B/l UE 5/5 proximal to distal LLE: 4-/5 HF, KE, 4/5 ADP/PF RLE: 2-/5 HF, KE, 1-2/5 ADP/PF--stable. Skin: wound clean  Psychiatric: He has a normal mood and affect. His behavior is normal.   Assessment/Plan: 1. Functional deficits secondary to lumbar stenosis with radiculopathy status post decompression L-2-L5 which require 3+ hours per day of interdisciplinary therapy in a comprehensive inpatient rehab setting. Physiatrist is providing close team supervision and 24 hour management of active medical problems  listed below. Physiatrist and rehab team continue to assess barriers to discharge/monitor patient progress toward functional and medical goals.  Function:  Bathing Bathing position   Position: Shower  Bathing parts Body parts bathed by patient: Right arm, Left arm, Chest, Abdomen, Front perineal area, Right upper leg, Left upper leg, Right lower leg, Left lower leg, Back, Buttocks Body parts bathed by helper: Buttocks  Bathing assist Assist Level: Set up (LH sponge)   Set up : To obtain items  Upper Body Dressing/Undressing Upper body dressing   What is the patient wearing?: Pull over shirt/dress, Orthosis     Pull over shirt/dress - Perfomed by patient: Thread/unthread right sleeve, Thread/unthread left sleeve, Put head through opening, Pull shirt over trunk       Orthosis activity level: Performed by patient  Upper body assist Assist Level: More than reasonable time   Set up : To obtain clothing/put away  Lower Body Dressing/Undressing Lower body dressing   What is the patient wearing?: Pants, [employer], Shoes, Underwear Underwear - Performed by patient: Thread/unthread right underwear leg, Thread/unthread left underwear leg (Reacher) Underwear - Performed by helper: Pull underwear up/down Pants- Performed by patient: Thread/unthread right pants leg, Thread/unthread left pants leg Pants- Performed by helper: Pull pants up/down Non-skid slipper socks- Performed by patient: Don/doff right sock, Don/doff left sock (sok aid) Non-skid slipper socks- Performed by helper: Don/doff  right sock, Don/doff left sock       Shoes - Performed by helper: Don/doff right shoe, Don/doff left shoe, Fasten right, Fasten left       TED Hose - Performed by helper: Don/doff right TED hose, Don/doff left TED hose  Lower body assist Assist for lower body dressing: 2 Helpers      Toileting Toileting   Toileting steps completed by patient: Adjust clothing prior to toileting Toileting steps  completed by helper: Performs perineal hygiene, Adjust clothing after toileting Toileting Assistive Devices: Grab bar or rail  Toileting assist Assist level: Touching or steadying assistance (Pt.75%)   Transfers Chair/bed transfer   Chair/bed transfer method: Stand pivot Chair/bed transfer assist level: Moderate assist (Pt 50 - 74%/lift or lower) Chair/bed transfer assistive device: Armrests, Mechanical lift, Sliding board Mechanical lift: Stedy   Locomotion Ambulation Ambulation activity did not occur: Safety/medical concerns   Max distance: 8' Assist level: Moderate assist (Pt 50 - 74%)   Wheelchair   Type: Manual Max wheelchair distance: 175 Assist Level: No help, No cues, assistive device, takes more than reasonable amount of time  Cognition Comprehension Comprehension assist level: Follows complex conversation/direction with no assist  Expression Expression assist level: Expresses complex ideas: With no assist  Social Interaction Social Interaction assist level: Interacts appropriately with others - No medications needed.  Problem Solving Problem solving assist level: Solves complex problems: Recognizes & self-corrects  Memory Memory assist level: Complete Independence: No helper    Medical Problem List and Plan: 1.  Decreased functional mobility secondary to lumbar stenosis with radiculopathy status post decompression L-2-L5. Back brace when out of bed  Continue CIR therapies  -consider right knee brace for stability moving forward   2.  DVT Prophylaxis/Anticoagulation: SCDs.   Vascular study negative 3. Pain Management: Robaxin and oxycodone as needed  -appears controlled at present  -has mild dysesthesias RLE--no rx needed at present 4. Mood: Provide emotional support 5. Neuropsych: This patient is capable of making decisions on his own behalf. 6. Skin/Wound Care: Routine skin checks. Monitor back incision. 7. Fluids/Electrolytes/Nutrition: encourage PO  -BUN with  improvement today  -I personally reviewed the patient's labs today.  8. Hypertension. (Aldactone 25 mg daily, HCTZ 25 mg daily----held), Avapro 300 mg daily, Norvasc 5 mg daily.   -bp's under good control at present  -continue norvasc   -continue to hold hctz    -pushing po fluids  9. Prediabetic. Hemoglobin A1c 5.3. Blood sugar checks discontinued 10. Constipation. Laxative assistance with results   LOS (Days) 7 A FACE TO FACE EVALUATION WAS PERFORMED  SWARTZ,ZACHARY T 12/11/2016 8:31 AM

## 2016-12-12 ENCOUNTER — Inpatient Hospital Stay (HOSPITAL_COMMUNITY): Payer: Managed Care, Other (non HMO) | Admitting: Physical Therapy

## 2016-12-12 NOTE — Progress Notes (Signed)
[place] PHYSICAL MEDICINE & REHABILITATION     PROGRESS NOTE  Subjective/Complaints:  Feeling well. No progression of right leg symptoms.   ROS: pt denies nausea, vomiting, diarrhea, cough, shortness of breath or chest pain     Objective: Vital Signs: Blood pressure 136/79, pulse 81, temperature 99.8 F (37.7 C), temperature source Oral, resp. rate 18, height 6\' 7"  (2.007 m), weight 115.9 kg (255 lb 8 oz), SpO2 98 %. No results found. No results for input(s): WBC, HGB, HCT, PLT in the last 72 hours.  Recent Labs  12/10/16 0519  NA 139  K 3.8  CL 102  GLUCOSE 89  BUN 19  CREATININE 0.92  CALCIUM 9.0   CBG (last 3)  No results for input(s): GLUCAP in the last 72 hours.  Wt Readings from Last 3 Encounters:  12/08/16 115.9 kg (255 lb 8 oz)  11/25/16 122.8 kg (270 lb 11.2 oz)  11/03/16 117 kg (258 lb)    Physical Exam:  BP 136/79   Pulse 81   Temp 99.8 F (37.7 C) (Oral)   Resp 18   Ht 6\' 7"  (2.007 m)   Wt 115.9 kg (255 lb 8 oz)   SpO2 98%   BMI 28.78 kg/m  Constitutional: NAD. Looks a little pale HENT: Normocephalic. Atraumatic Eyes: EOM are normal. No discharge.  Cardiovascular:RRR Respiratory: clear GI: Soft. Bowel sounds are normal.  Musculoskeletal: trace LE edema.  Neurological: He is alert and oriented.  Sensation diminished to light touch RLE 1 to 1+/2 Motor: B/l UE 5/5 proximal to distal LLE: 4-/5 HF, KE, 4/5 ADP/PF RLE: 2-/5 HF, KE, 1-2/5 ADP/PF--stable. Skin: wound clean  Psychiatric: He has a normal mood and affect. His behavior is normal.   Assessment/Plan: 1. Functional deficits secondary to lumbar stenosis with radiculopathy status post decompression L-2-L5 which require 3+ hours per day of interdisciplinary therapy in a comprehensive inpatient rehab setting. Physiatrist is providing close team supervision and 24 hour management of active medical problems listed below. Physiatrist and rehab team continue to assess barriers to  discharge/monitor patient progress toward functional and medical goals.  Function:  Bathing Bathing position   Position: Shower  Bathing parts Body parts bathed by patient: Right arm, Left arm, Chest, Abdomen, Front perineal area, Right upper leg, Left upper leg, Right lower leg, Left lower leg, Back, Buttocks Body parts bathed by helper: Buttocks  Bathing assist Assist Level: Set up (LH sponge)   Set up : To obtain items  Upper Body Dressing/Undressing Upper body dressing   What is the patient wearing?: Pull over shirt/dress, Orthosis     Pull over shirt/dress - Perfomed by patient: Thread/unthread right sleeve, Thread/unthread left sleeve, Put head through opening, Pull shirt over trunk       Orthosis activity level: Performed by patient  Upper body assist Assist Level: More than reasonable time   Set up : To obtain clothing/put away  Lower Body Dressing/Undressing Lower body dressing   What is the patient wearing?: Pants, [employer], Shoes, Underwear Underwear - Performed by patient: Thread/unthread right underwear leg, Thread/unthread left underwear leg (Reacher) Underwear - Performed by helper: Pull underwear up/down Pants- Performed by patient: Thread/unthread right pants leg, Thread/unthread left pants leg Pants- Performed by helper: Pull pants up/down Non-skid slipper socks- Performed by patient: Don/doff right sock, Don/doff left sock (sok aid) Non-skid slipper socks- Performed by helper: Don/doff right sock, Don/doff left sock       Shoes - Performed by helper: Don/doff right shoe, Don/doff  left shoe, Fasten right, Fasten left       TED Hose - Performed by helper: Don/doff right TED hose, Don/doff left TED hose  Lower body assist Assist for lower body dressing: 2 Helpers      Toileting Toileting Toileting activity did not occur: Refused Toileting steps completed by patient: Adjust clothing prior to toileting Toileting steps completed by helper: Performs  perineal hygiene, Adjust clothing after toileting Toileting Assistive Devices: Grab bar or rail  Toileting assist Assist level: Touching or steadying assistance (Pt.75%)   Transfers Chair/bed transfer   Chair/bed transfer method: Stand pivot Chair/bed transfer assist level: Moderate assist (Pt 50 - 74%/lift or lower) Chair/bed transfer assistive device: Armrests, Mechanical lift, Sliding board Mechanical lift: Stedy   Locomotion Ambulation Ambulation activity did not occur: Safety/medical concerns   Max distance: 8' Assist level: Moderate assist (Pt 50 - 74%)   Wheelchair   Type: Manual Max wheelchair distance: 175 Assist Level: No help, No cues, assistive device, takes more than reasonable amount of time  Cognition Comprehension Comprehension assist level: Follows complex conversation/direction with no assist  Expression Expression assist level: Expresses complex ideas: With no assist  Social Interaction Social Interaction assist level: Interacts appropriately with others - No medications needed.  Problem Solving Problem solving assist level: Solves complex problems: Recognizes & self-corrects  Memory Memory assist level: Complete Independence: No helper    Medical Problem List and Plan: 1.  Decreased functional mobility secondary to lumbar stenosis with radiculopathy status post decompression L-2-L5. Back brace when out of bed  Continue CIR therapies  -consider right knee brace for stability moving forward    -will ask [employer] to adjust straps on back brace 2.  DVT Prophylaxis/Anticoagulation: SCDs.   Vascular study negative 3. Pain Management: Robaxin and oxycodone as needed  -appears controlled at present  -has mild dysesthesias RLE--no rx needed at present  -right ribcage pain may be from back brace   -ice, analgesics prn. Pad for brace (towel) 4. Mood: Provide emotional support 5. Neuropsych: This patient is capable of making decisions on his own behalf. 6. Skin/Wound  Care: Routine skin checks. Monitor back incision. 7. Fluids/Electrolytes/Nutrition: encourage PO  -BUN with improvement   8. Hypertension. (Aldactone 25 mg daily, HCTZ 25 mg daily----held), Avapro 300 mg daily, Norvasc 5 mg daily.   -bp's under good control at present  -continue norvasc   -continue to hold hctz    -pushing po fluids  9. Prediabetic. Hemoglobin A1c 5.3. Blood sugar checks discontinued 10. Constipation. Laxative assistance with results   LOS (Days) 8 A FACE TO FACE EVALUATION WAS PERFORMED  [name] T 12/12/2016 8:56 AM

## 2016-12-12 NOTE — Progress Notes (Signed)
Orthopedic Tech Progress Note Patient Details:  ATA ABBITT 04/15/1961 [id number]  Patient ID: ROCKWELL ZELDIN, male   DOB: [birth year], 56 y.o.   MRN: [id number]   [name] 12/12/2016, 10:27 AM Called in [employer] brace order; spoke with Mortimer Fries

## 2016-12-12 NOTE — Progress Notes (Signed)
Physical Therapy Session Note  Patient Details  Name: [name] MRN: [id number] Date of Birth: [birth year]  Today's Date: 12/12/2016 PT Individual Time: [id number] PT Individual Time Calculation (min): 44 min   Short Term Goals: Week 1:  PT Short Term Goal 1 (Week 1): Pt will perform bed <> chair transfers with max A PT Short Term Goal 2 (Week 1): Pt will perform gait in controlled environment wiht max A x 10'  Skilled Therapeutic Interventions/Progress Updates:  Pt received in w/c & agreeable to tx, noting 2/10 pain in ribs with coughing/movement but declining requesting pain medication. Educated pt on squeezing pillow when needing to cough or sneeze to help reduce pain. Pt reported representative from [facility] came today to adjust brace. Pt recalled 3/3 back precautions independently. Pt propelled w/c room<>gym with BUE for cardiopulmonary endurance training. Attempted sit<>stand at parallel bars with varying hand placement but unable to clear buttocks and attempt transfer 2/2 R rib pain. Instructed pt on BLE strengthening exercises including LLE long arc quads, RLE seated heel slides, and hip adduction pillow squeezes. Pt transferred w/c<>nu-step via squat pivot with close supervision. Pt utilized nu-step level 1 x 5 minutes with BLE only & RLE attachment. Pt required multiple rest breaks during activity & noted more BLE weakness on this date. At end of session pt left in w/c in room with all needs within reach.     Therapy Documentation Precautions:  Precautions Precautions: Back, Fall Required Braces or Orthoses: Spinal Brace Spinal Brace: Lumbar corset, Applied in standing position Restrictions Weight Bearing Restrictions: No   See Function Navigator for Current Functional Status.   Therapy/Group: Individual Therapy  Waunita Schooner 12/12/2016, 4:29 PM

## 2016-12-13 ENCOUNTER — Inpatient Hospital Stay (HOSPITAL_COMMUNITY): Payer: Managed Care, Other (non HMO) | Admitting: Occupational Therapy

## 2016-12-13 ENCOUNTER — Inpatient Hospital Stay (HOSPITAL_COMMUNITY): Payer: Managed Care, Other (non HMO) | Admitting: Physical Therapy

## 2016-12-13 MED ORDER — BISACODYL 10 MG RE SUPP
10.0000 mg | Freq: Every day | RECTAL | Status: DC | PRN
  Administered 2016-12-13: 10 mg via RECTAL
  Filled 2016-12-13: qty 1

## 2016-12-13 NOTE — Progress Notes (Signed)
Occupational Therapy Weekly Progress Note  Patient Details  Name: [name] MRN: [id number] Date of Birth: 19-Nov-1961  Beginning of progress report period: December 05, 2016 End of progress report period: December 13, 2016  Today's Date: 12/13/2016 OT Individual Time: 6301-6010 and 1300-1345 OT Individual Time Calculation (min): 75 min and 45 min   Patient has met 3 of 4 short term goals.  Pt making slow but steady progress towards OT goals. He cont to be most limited by R LE weakness and decreased activity tolerance. Squat pivots being completed with supervision. He can stand from elevated surfaces with min-mod A depending on fatigue level, however, has high reliance on UE support during standing and therefore requires assist with any standing ADL task.  He requires most assist for thing in the morning and then during late P.M. Therapy sessions when he is fatigued. During morning ADLs, pt has required +2 assist to stand from w/c to complete LB dressing/ bathing at sink. When task is completed from EOB, and bed can be elevated, pt is able to stand with assist +1, however, this is not a possibility when d/c home.   Patient continues to demonstrate the following deficits: abnormal posture, lumbago (low back pain) and muscle weakness (generalized) and therefore will continue to benefit from skilled OT intervention to enhance overall performance with BADL and Reduce care partner burden.  Patient progressing toward long term goals..  Continue plan of care.  OT Short Term Goals Week 1:  OT Short Term Goal 1 (Week 1): Pt will complete toilet transfer with 1 helper and LRAD OT Short Term Goal 1 - Progress (Week 1): Met OT Short Term Goal 2 (Week 1): Pt will complete LB dressing with Max A and AE PRN OT Short Term Goal 2 - Progress (Week 1): Met OT Short Term Goal 3 (Week 1): Pt will complete sit<stand for LB ADLs with LRAD and 1 helper OT Short Term Goal 3 - Progress (Week 1): Not met OT Short  Term Goal 4 (Week 1): Pt will complete bathing with AE and 1 helper OT Short Term Goal 4 - Progress (Week 1): Met Week 2:  OT Short Term Goal 1 (Week 2): Pt wll complete stand pivot transfer to toilet using RW with mod A OT Short Term Goal 2 (Week 2): Pt will stand to complete buttock hygiene with steadying assist OT Short Term Goal 3 (Week 2): Pt will pull pants up during LB dressing with mod A to stand at RW and steadying assist for dynamic balance  Skilled Therapeutic Interventions/Progress Updates:    Session One: Pt seen for OT ADL bathing/dressing session. Pt in supine upon arrival, voicing feeling better than he did over weekend. He noted pain to be "noticable" in ribs, however, did not rate pain and agreeable to tx session. He transferred to EOB using leg lifter and hospital bed functions with increased time.  Squat pivot completed with guarding assist and increased time to w/c, pt voiced feeling "really stiff" this morning. He declined shower, opting to bathe at sink this morning. He gathered clothing items from w/c level and bathed seated at sink. Attempted x2 trials to stand from w/c to RW with max A +1 and while pt able to clear bottom from chair and come into 3/4 stand he was unable to let go of w/c with second hand to move to RW to achieve full stance. With +2 assist pt able to stand to RW while clothing management and buttock hygiene  performed total A. Remaining poritions of LB bathing completed from w/c level with use of LH sponge. Used reacher to thread pants while maintaining spinal precautions. Pt able to stand with max A +1 for pants to be pulled up. Pt completed x2 more trials sit <> stand at [place] with mod-max A to stand and B UE support. Pt tolerated ~10 seconds each trial before requesting seated rest break. Pt left seated in w/c at end of session, all needs in reach and set-up with breakfast tray.   Session Two: Pt seen for OT session addressing standing tolerance and balance. Pt  sitting up in w/c upon arrival, agreeable to tx session. Self propelled w/c to therapy gym mod I.  Pt opting to work on sit <> stands in // bars this session to cont to gain confidence in LEs before advancing to RW. Completed sit <> stands in // bars with mod A using B UEs to assist with pushing up into standing. Assist provided for blocking/ proprioceptive input at B knees, R>L. Verbal and tactile cues provided to anterior pelvic tilt and upright chest. Pt remains with heavy reliance on UEs, however, was able to quickly alternate lifting up with supportive UE. He was able to tolerate ~10 seconds standing each trial before requiring seated rest break. Seated SAQ completed with foot supported on soccer ball and assist for stability at ankle and knee from therapist for positioning. Pt requiring assist on B sides for full ROM, R>L. Pt returned to room at end of session, left seated in w/c with all needs in reach.   Therapy Documentation Precautions:  Precautions Precautions: Back, Fall Required Braces or Orthoses: Spinal Brace Spinal Brace: Lumbar corset, Applied in standing position Restrictions Weight Bearing Restrictions: No Pain:   No/ denies pain ADL: ADL ADL Comments: Please see functional navigator for ADL status  See Function Navigator for Current Functional Status.   Therapy/Group: Individual Therapy  Lewis, Taft Worthing C 12/13/2016, 7:10 AM

## 2016-12-13 NOTE — Progress Notes (Signed)
[place] PHYSICAL MEDICINE & REHABILITATION     PROGRESS NOTE  Subjective/Complaints:  Had some nausea yesterday after laxative. Feels better today. [employer] adjusted straps on brace  ROS: pt denies nausea, vomiting, diarrhea, cough, shortness of breath or chest pain      Objective: Vital Signs: Blood pressure (!) 146/76, pulse 75, temperature 99.5 F (37.5 C), temperature source Oral, resp. rate 18, height 6\' 7"  (2.007 m), weight 115.9 kg (255 lb 8 oz), SpO2 98 %. No results found. No results for input(s): WBC, HGB, HCT, PLT in the last 72 hours. No results for input(s): NA, K, CL, GLUCOSE, BUN, CREATININE, CALCIUM in the last 72 hours.  Invalid input(s): CO CBG (last 3)  No results for input(s): GLUCAP in the last 72 hours.  Wt Readings from Last 3 Encounters:  12/08/16 115.9 kg (255 lb 8 oz)  11/25/16 122.8 kg (270 lb 11.2 oz)  11/03/16 117 kg (258 lb)    Physical Exam:  BP (!) 146/76   Pulse 75   Temp 99.5 F (37.5 C) (Oral)   Resp 18   Ht 6\' 7"  (2.007 m)   Wt 115.9 kg (255 lb 8 oz)   SpO2 98%   BMI 28.78 kg/m  Constitutional: NAD. Looks a little pale HENT: Normocephalic. Atraumatic Eyes: EOM are normal. No discharge.  Cardiovascular:RRR Respiratory: clear GI: Soft. Bowel sounds are normal.  Musculoskeletal: trace LE edema.  Neurological: He is alert and oriented.  Sensation diminished to light touch RLE 1 to 1+/2 Motor: B/l UE 5/5 proximal to distal LLE: 4-/5 HF, KE, 4/5 ADP/PF RLE: 2/5 HF, KE, 1-2/5 ADP/PF--slight improvement. Skin: wound clean  Psychiatric: He has a normal mood and affect. His behavior is normal.   Assessment/Plan: 1. Functional deficits secondary to lumbar stenosis with radiculopathy status post decompression L-2-L5 which require 3+ hours per day of interdisciplinary therapy in a comprehensive inpatient rehab setting. Physiatrist is providing close team supervision and 24 hour management of active medical problems listed  below. Physiatrist and rehab team continue to assess barriers to discharge/monitor patient progress toward functional and medical goals.  Function:  Bathing Bathing position   Position: Wheelchair/chair at sink  Bathing parts Body parts bathed by patient: Right arm, Left arm, Chest, Abdomen, Front perineal area, Right upper leg, Left upper leg, Right lower leg, Left lower leg, Back Body parts bathed by helper: Buttocks  Bathing assist Assist Level: Touching or steadying assistance(Pt > 75%)   Set up : To obtain items  Upper Body Dressing/Undressing Upper body dressing   What is the patient wearing?: Pull over shirt/dress, Orthosis     Pull over shirt/dress - Perfomed by patient: Thread/unthread right sleeve, Thread/unthread left sleeve, Put head through opening, Pull shirt over trunk       Orthosis activity level: Performed by patient  Upper body assist Assist Level: More than reasonable time   Set up : To obtain clothing/put away  Lower Body Dressing/Undressing Lower body dressing   What is the patient wearing?: Pants, [employer], Shoes, Underwear Underwear - Performed by patient: Thread/unthread right underwear leg, Thread/unthread left underwear leg (Reacher) Underwear - Performed by helper: Pull underwear up/down Pants- Performed by patient: Thread/unthread right pants leg, Thread/unthread left pants leg Pants- Performed by helper: Pull pants up/down Non-skid slipper socks- Performed by patient: Don/doff right sock, Don/doff left sock (sok aid) Non-skid slipper socks- Performed by helper: Don/doff right sock, Don/doff left sock       Shoes - Performed by helper: Don/doff  right shoe, Don/doff left shoe, Fasten right, Fasten left       TED Hose - Performed by helper: Don/doff right TED hose, Don/doff left TED hose  Lower body assist Assist for lower body dressing: 2 Helpers      Toileting Toileting Toileting activity did not occur: Refused Toileting steps completed by  patient: Adjust clothing prior to toileting Toileting steps completed by helper: Performs perineal hygiene, Adjust clothing after toileting Toileting Assistive Devices: Grab bar or rail  Toileting assist Assist level: Touching or steadying assistance (Pt.75%)   Transfers Chair/bed transfer   Chair/bed transfer method: Lateral scoot Chair/bed transfer assist level: Supervision or verbal cues Chair/bed transfer assistive device: Armrests Mechanical lift: Stedy   Locomotion Ambulation Ambulation activity did not occur: Safety/medical concerns   Max distance: 8' Assist level: Moderate assist (Pt 50 - 74%)   Wheelchair   Type: Manual Max wheelchair distance: 150 ft Assist Level: Supervision or verbal cues  Cognition Comprehension Comprehension assist level: Follows complex conversation/direction with no assist  Expression Expression assist level: Expresses complex ideas: With no assist  Social Interaction Social Interaction assist level: Interacts appropriately with others - No medications needed.  Problem Solving Problem solving assist level: Solves complex problems: Recognizes & self-corrects  Memory Memory assist level: Complete Independence: No helper    Medical Problem List and Plan: 1.  Decreased functional mobility secondary to lumbar stenosis with radiculopathy status post decompression L-2-L5. Back brace when out of bed  Continue CIR therapies  -consider right knee brace for stability moving forward  (still does not have antigravity strength in knee/hip)  -appreciate biotech follow up 2.  DVT Prophylaxis/Anticoagulation: SCDs.   Vascular study negative 3. Pain Management: Robaxin and oxycodone as needed  -appears controlled at present  -has mild dysesthesias RLE--no rx needed at present  -right ribcage pain may be from back brace   -continue ice, analgesics prn. Pad for brace (towel) 4. Mood: Provide emotional support 5. Neuropsych: This patient is capable of making  decisions on his own behalf. 6. Skin/Wound Care: Routine skin checks. Monitor back incision. 7. Fluids/Electrolytes/Nutrition: encourage PO  -BUN with improvement   8. Hypertension. (Aldactone 25 mg daily, HCTZ 25 mg daily----held), Avapro 300 mg daily, Norvasc 5 mg daily.   -bp's controlled at present  -continue norvasc   -continue to hold hctz. Perhaps can   -pushing po fluids  9. Prediabetic. Hemoglobin A1c 5.3. Blood sugar checks discontinued 10. Constipation. Laxative assistance with results   LOS (Days) 9 A FACE TO FACE EVALUATION WAS PERFORMED  Zaryan Yakubov T 12/13/2016 8:48 AM

## 2016-12-13 NOTE — Progress Notes (Signed)
Physical Therapy Weekly Progress Note  Patient Details  Name: [name] MRN: [id number] Date of Birth: [birth year]  Beginning of progress report period: December 05, 2016 End of progress report period: December 13, 2016  Today's Date: 12/13/2016 PT Individual Time: 0910-1000 and 1500-1530 PT Individual Time Calculation (min): 50 min and 30 min (total 80 min)   Patient has met 1 of 2 short term goals with progress towards unmet goal. Pt currently performing bed mobility with minA, squat pivot transfers with S, and w/c propulsion with S. Requires mod/maxA sit <>stand from w/c and normal seat height due to significant LE strength deficits. Gait training initiated in parallel bars with min guard x8' however heavy reliance on upper body strength. Minimal pre-gait activities with RW reveal poor weight bearing/acceptance through LEs. Pt is motivated, hard working, and continues to demonstrate slow progress towards goals.   Patient continues to demonstrate the following deficits muscle weakness, decreased cardiorespiratoy endurance, unbalanced muscle activation and decreased coordination and decreased standing balance, decreased postural control and decreased balance strategies and therefore will continue to benefit from skilled PT intervention to increase functional independence with mobility.  Patient progressing toward long term goals..  Continue plan of care.  PT Short Term Goals Week 1:  PT Short Term Goal 1 (Week 1): Pt will perform bed <> chair transfers with max A PT Short Term Goal 1 - Progress (Week 1): Met PT Short Term Goal 2 (Week 1): Pt will perform gait in controlled environment wiht max A x 10' PT Short Term Goal 2 - Progress (Week 1): Progressing toward goal Week 2:  PT Short Term Goal 1 (Week 2): Pt will perform bed mobility with S PT Short Term Goal 2 (Week 2): Pt will perform sit <>stand with consistent minA from w/c seat height PT Short Term Goal 3 (Week 2): Pt will perform stand  pivot transfer w/c <>bed with minA PT Short Term Goal 4 (Week 2): Pt will ambulate x10' with modA and RW PT Short Term Goal 5 (Week 2): Pt will initiate stair training  Skilled Therapeutic Interventions/Progress Updates: Tx 1: Pt received seated in w/c, denies pain and requests to finish breakfast before participation; missed 10 min PT. W/c propulsion to gym modI for BUE strengthening and aerobic endurance. Squat pivot transfer w/c <>mat table with close S. Sit <>stand x5 reps from elevated mat table at 26"; requires maxA to transition L hand from mat to RW. Sit >stand x4 trials from w/c level to attempt stand pivot transfer to tilt table; unable to perform with +2A due to low seat height. Returned to gym totalA for energy conservation. Sit <>stand in parallel bars with maxA; standing weight shifts with alternating small UE lift from bars to facilitate weight bearing and stance control. Repetitive verbal/tactile cueing for hip/knee extension in stance. RLE w/c propulsion backward with AAROM hip flexion, AROM knee extension to push w/c backward for forced use and NMR. Remained seated in w/c at end of session, all needs in reach.   Tx 2: Pt received seated in w/c, denies pain and agreeable to treatment. W/c propulsion to/from gym with modI for UE strengthening and aerobic endurance. Standing frame with 2 sets 10 reps quad sets, 3 sets 10 reps glute sets with cues/demonstration for hip extension as opposed to pelvic tilt. Requires occasional seated rest breaks due to fatigue. Remained seated in w/c at end of session, all needs in reach.      Therapy Documentation Precautions:  Precautions Precautions: Back,  Fall Required Braces or Orthoses: Spinal Brace Spinal Brace: Lumbar corset, Applied in standing position Restrictions Weight Bearing Restrictions: No General: PT Amount of Missed Time (min): 10 Minutes PT Missed Treatment Reason:  (breakfast) Pain: Pain Assessment Pain Assessment: 0-10 Pain  Score: 5  Pain Type: Acute pain Pain Location: Rib cage Pain Orientation: Right Pain Descriptors / Indicators: Aching Pain Onset: On-going Pain Intervention(s): Refused   See Function Navigator for Current Functional Status.  Therapy/Group: Individual Therapy  Luberta Mutter 12/13/2016, 9:59 AM

## 2016-12-14 ENCOUNTER — Inpatient Hospital Stay (HOSPITAL_COMMUNITY): Payer: Managed Care, Other (non HMO) | Admitting: Physical Therapy

## 2016-12-14 ENCOUNTER — Inpatient Hospital Stay (HOSPITAL_COMMUNITY): Payer: Managed Care, Other (non HMO) | Admitting: Occupational Therapy

## 2016-12-14 NOTE — Progress Notes (Addendum)
Physical Therapy Session Note  Patient Details  Name: [name] MRN: [id number] Date of Birth: 11-Feb-1961  Today's Date: 12/14/2016 PT Individual Time: 0900-1000 and 1435-1505 PT Individual Time Calculation (min): 60 min and 30 min (total 90 min)   Short Term Goals: Week 2:  PT Short Term Goal 1 (Week 2): Pt will perform bed mobility with S PT Short Term Goal 2 (Week 2): Pt will perform sit <>stand with consistent minA from w/c seat height PT Short Term Goal 3 (Week 2): Pt will perform stand pivot transfer w/c <>bed with minA PT Short Term Goal 4 (Week 2): Pt will ambulate x10' with modA and RW PT Short Term Goal 5 (Week 2): Pt will initiate stair training  Skilled Therapeutic Interventions/Progress Updates: Tx 1: Pt received seated in w/c, denies pain and agreeable to treatment. W/c propulsion to gym modI for UE strengthening and endurance. Sit <>stand in parallel bars with heavy UE reliance and min guard; x6 reps total. In standing performed static standing, mini-squats, and walking in place. Note occasional activation of R quads, however unable to maintain in stance. Squat pivot transfer w/c>mat table with close S. Sit <>stand x3 trials maxA however pt unable to transition LUE from mat>RW. Sit>supine minA. Bridging with bolster x15 reps, short arc quad x15 reps; no AROM noted RLE with trace activation in L quad. Sidelying RLE hip extension and knee extension again with trace activation noted in both muscle groups. Sidelying>sit with minA for RLE management. Semi-reclined situps x10 reps for focus on core strengthening. Remained seated edge of mat at end of session for next PT session.   Tx 2: Pt received seated in w/c, denies pain and agreeable to treatment. W/c propulsion to/from gym modI for UE strengthening and endurance. Attempted squat pivot transfer w/c >mat table for focus on sit <>stand from elevated surface, however pt reporting significant fatigue from therapy sessions today.  Agreeable to perform nustep for strengthening and NMR. Squat pivot transfer w/c <>nustep with minA d/t fatigue. Performed nustep x 8 min total with BLE only; level 4 with average 40 steps/min, however several rest breaks required d/t fatigue. Remained seated in w/c at end of session to return to room modI.      Therapy Documentation Precautions:  Precautions Precautions: Back, Fall Required Braces or Orthoses: Spinal Brace Spinal Brace: Lumbar corset, Applied in standing position Restrictions Weight Bearing Restrictions: No   See Function Navigator for Current Functional Status.   Therapy/Group: Individual Therapy  Luberta Mutter 12/14/2016, 10:00 AM

## 2016-12-14 NOTE — Progress Notes (Signed)
Physical Therapy Session Note  Patient Details  Name: [name] MRN: [id number] Date of Birth: 09/06/1961  Today's Date: 12/14/2016 PT Individual Time: 1000-1055 PT Individual Time Calculation (min): 55 min   Short Term Goals: Week 2:  PT Short Term Goal 1 (Week 2): Pt will perform bed mobility with S PT Short Term Goal 2 (Week 2): Pt will perform sit <>stand with consistent minA from w/c seat height PT Short Term Goal 3 (Week 2): Pt will perform stand pivot transfer w/c <>bed with minA PT Short Term Goal 4 (Week 2): Pt will ambulate x10' with modA and RW PT Short Term Goal 5 (Week 2): Pt will initiate stair training  Skilled Therapeutic Interventions/Progress Updates: Pt presented in gym sitting at [facility] having completed previous therapy session. Attempted sit to stand from 24in height. Pt with x5 attempts, with cues for scooting to EOM to facilitate transfer. Pt successful on 5th attempt with mod/maxA. Performed squat pivot transfers x 4 from mat to w/c with minA progressing to min guard. Performed sit to stand from parallel bars with pt using UE pulling self up. PTA performed tapping at glute max to facilitate ms activation with PTA providing R knee blocking. Pt propelled self back to room with supervision and left in chair with alarm within reach and all needs met.       Therapy Documentation Precautions:  Precautions Precautions: Back, Fall Required Braces or Orthoses: Spinal Brace Spinal Brace: Lumbar corset, Applied in standing position Restrictions Weight Bearing Restrictions: No   See Function Navigator for Current Functional Status.   Therapy/Group: Individual Therapy  Carolynne Schuchard  Rhet Rorke, PTA  12/14/2016, 12:56 PM

## 2016-12-14 NOTE — Progress Notes (Signed)
Occupational Therapy Session Note  Patient Details  Name: [name] MRN: [id number] Date of Birth: 01/23/1961  Today's Date: 12/14/2016 OT Individual Time: [id number] OT Individual Time Calculation (min): 60 min    Short Term Goals: Week 2:  OT Short Term Goal 1 (Week 2): Pt wll complete stand pivot transfer to toilet using RW with mod A OT Short Term Goal 2 (Week 2): Pt will stand to complete buttock hygiene with steadying assist OT Short Term Goal 3 (Week 2): Pt will pull pants up during LB dressing with mod A to stand at RW and steadying assist for dynamic balance  Skilled Therapeutic Interventions/Progress Updates:    Pt seen for OT ADL bathing/dressing session He was in supine upon arrival, denying pain, ready for tx session. He declined shower task this AM, opting to bathe UB only from EOB. Transferred to EOB using leg lifter and hospital bed features with increased time. UB bathing/dressing completed with set-up assist, He completed lateral leans to complete buttock hygiene. Reacher used to assist with threading LEs into pants, min A provided for clothing management and bed elevated to ease LE management during threading task. He completed lateral leans to pull pants up with distant supervision and increased time. Pt voiced feeling lateral leans will work at d/c vs standing to complete clothng management as he hopes to be as independent as possible.  Pt introduced and provided with long handled shoe horn and elastic shoelaces for increased independence with ADLs, still required assist for shoes for positioning as pt unable to lean forward to see where to place foot. Supervision squat/ scoot pivot completed into w/c with supervision. Grooming completed seated at sink mod I. Attempted x2 sit > stands, however, despite max A, pt unable to achieve full upright stand and pt deisring to wait until next session to attempt stand again. Pt left seated in w/c at end of session, set-up with  breakfast tray and all needs in reach.   Therapy Documentation Precautions:  Precautions Precautions: Back, Fall Required Braces or Orthoses: Spinal Brace Spinal Brace: Lumbar corset, Applied in standing position Restrictions Weight Bearing Restrictions: No Pain:   No/ denies pain ADL: ADL ADL Comments: Please see functional navigator for ADL status  See Function Navigator for Current Functional Status.   Therapy/Group: Individual Therapy  Lewis, Tilda Samudio C 12/14/2016, 7:01 AM

## 2016-12-14 NOTE — Progress Notes (Signed)
PHYSICAL MEDICINE & REHABILITATION     PROGRESS NOTE  Subjective/Complaints:  Overall feeling well. Denies pain today. Had bm last noc  ROS: pt denies nausea, vomiting, diarrhea, cough, shortness of breath or chest pain      Objective: Vital Signs: Blood pressure 119/74, pulse 84, temperature 98.6 F (37 C), temperature source Oral, resp. rate 18, height 6\' 7"  (2.007 m), weight 115.9 kg (255 lb 8 oz), SpO2 98 %. No results found. No results for input(s): WBC, HGB, HCT, PLT in the last 72 hours. No results for input(s): NA, K, CL, GLUCOSE, BUN, CREATININE, CALCIUM in the last 72 hours.  Invalid input(s): CO CBG (last 3)  No results for input(s): GLUCAP in the last 72 hours.  Wt Readings from Last 3 Encounters:  12/08/16 115.9 kg (255 lb 8 oz)  11/25/16 122.8 kg (270 lb 11.2 oz)  11/03/16 117 kg (258 lb)    Physical Exam:  BP 119/74 (BP Location: Left Arm)   Pulse 84   Temp 98.6 F (37 C) (Oral)   Resp 18   Ht 6\' 7"  (2.007 m)   Wt 115.9 kg (255 lb 8 oz)   SpO2 98%   BMI 28.78 kg/m  Constitutional: NAD. Looks a little pale HENT: Normocephalic. Atraumatic Eyes: EOM are normal. No discharge.  Cardiovascular:RRR Respiratory:  CTA B GI: Soft. Bowel sounds are normal.  Musculoskeletal: trace LE edema.  Neurological: He is alert and oriented.  Sensation diminished to light touch RLE 1 to 1+/2 Motor: B/l UE 5/5 proximal to distal LLE: 4-/5 HF, KE, 4/5 ADP/PF RLE: 2/5 HF, KE, 1-2/5 ADP/PF--no change. Skin: wound clean  Psychiatric: He has a normal mood and affect. His behavior is normal.   Assessment/Plan: 1. Functional deficits secondary to lumbar stenosis with radiculopathy status post decompression L-2-L5 which require 3+ hours per day of interdisciplinary therapy in a comprehensive inpatient rehab setting. Physiatrist is providing close team supervision and 24 hour management of active medical problems listed below. Physiatrist and rehab team continue  to assess barriers to discharge/monitor patient progress toward functional and medical goals.  Function:  Bathing Bathing position   Position: Sitting EOB  Bathing parts Body parts bathed by patient: Right arm, Left arm, Chest, Abdomen, Buttocks, Front perineal area Body parts bathed by helper: Buttocks  Bathing assist Assist Level: Supervision or verbal cues, Set up   Set up : To obtain items  Upper Body Dressing/Undressing Upper body dressing   What is the patient wearing?: Pull over shirt/dress, Orthosis     Pull over shirt/dress - Perfomed by patient: Thread/unthread right sleeve, Thread/unthread left sleeve, Put head through opening, Pull shirt over trunk       Orthosis activity level: Performed by patient  Upper body assist Assist Level: Set up   Set up : To obtain clothing/put away  Lower Body Dressing/Undressing Lower body dressing   What is the patient wearing?: Pants, [employer], Shoes, Underwear Underwear - Performed by patient: Thread/unthread right underwear leg, Thread/unthread left underwear leg, Pull underwear up/down Underwear - Performed by helper: Pull underwear up/down Pants- Performed by patient: Thread/unthread right pants leg, Thread/unthread left pants leg, Pull pants up/down Pants- Performed by helper: Pull pants up/down Non-skid slipper socks- Performed by patient: Don/doff right sock, Don/doff left sock (sok aid) Non-skid slipper socks- Performed by helper: Don/doff right sock, Don/doff left sock       Shoes - Performed by helper: Don/doff right shoe, Don/doff left shoe  TED Hose - Performed by helper: Don/doff right TED hose, Don/doff left TED hose  Lower body assist Assist for lower body dressing: Supervision or verbal cues      Toileting Toileting Toileting activity did not occur: Refused Toileting steps completed by patient: Adjust clothing prior to toileting Toileting steps completed by helper: Adjust clothing prior to toileting,  Performs perineal hygiene, Adjust clothing after toileting Toileting Assistive Devices: Grab bar or rail  Toileting assist Assist level: Touching or steadying assistance (Pt.75%)   Transfers Chair/bed transfer   Chair/bed transfer method: Squat pivot Chair/bed transfer assist level: Supervision or verbal cues Chair/bed transfer assistive device: Armrests Mechanical lift: Stedy   Locomotion Ambulation Ambulation activity did not occur: Safety/medical concerns   Max distance: 8' Assist level: Moderate assist (Pt 50 - 74%)   Wheelchair   Type: Manual Max wheelchair distance: 150 ft Assist Level: Supervision or verbal cues  Cognition Comprehension Comprehension assist level: Follows complex conversation/direction with no assist  Expression Expression assist level: Expresses complex ideas: With no assist  Social Interaction Social Interaction assist level: Interacts appropriately with others - No medications needed.  Problem Solving Problem solving assist level: Solves complex problems: Recognizes & self-corrects  Memory Memory assist level: Complete Independence: No helper    Medical Problem List and Plan: 1.  Decreased functional mobility secondary to lumbar stenosis with radiculopathy status post decompression L-2-L5. Back brace when out of bed  Continue CIR therapies--team conference  -will consider knee brace, ?drop lock/extension activated lock for ambulation  -appreciate biotech follow up 2.  DVT Prophylaxis/Anticoagulation: SCDs.   Vascular study negative 3. Pain Management: Robaxin and oxycodone as needed  -appears controlled at present  -has mild dysesthesias RLE-minimal at present  -right ribcage pain improved   -continue ice, analgesics prn. Pad for brace (towel) 4. Mood: Provide emotional support 5. Neuropsych: This patient is capable of making decisions on his own behalf. 6. Skin/Wound Care: Routine skin checks. Monitor back incision. 7.  Fluids/Electrolytes/Nutrition: encourage PO  -BUN with improvement   8. Hypertension. (Aldactone 25 mg daily, HCTZ 25 mg daily----held), Avapro 300 mg daily, Norvasc 5 mg daily.   -bp's controlled at present  -continue norvasc   -continue to hold hctz. Perhaps can resume as outpt--     9. Prediabetic. Hemoglobin A1c 5.3. Blood sugar checks discontinued 10. Constipation. Laxative assistance with results   LOS (Days) 10 A FACE TO FACE EVALUATION WAS PERFORMED  Tyeesha Riker T 12/14/2016 8:40 AM

## 2016-12-15 ENCOUNTER — Inpatient Hospital Stay (HOSPITAL_COMMUNITY): Payer: Managed Care, Other (non HMO) | Admitting: Occupational Therapy

## 2016-12-15 ENCOUNTER — Inpatient Hospital Stay (HOSPITAL_COMMUNITY): Payer: Managed Care, Other (non HMO) | Admitting: Physical Therapy

## 2016-12-15 MED ORDER — GUAIFENESIN 100 MG/5ML PO SOLN: 5.0000 mL | ORAL | Status: DC | PRN

## 2016-12-15 MED ORDER — HYDROCHLOROTHIAZIDE 12.5 MG PO CAPS
12.5000 mg | ORAL_CAPSULE | Freq: Every day | ORAL | Status: DC
  Administered 2016-12-15 – 2016-12-19 (×5): 12.5 mg via ORAL
  Filled 2016-12-15 (×6): qty 1

## 2016-12-15 NOTE — Progress Notes (Signed)
Orthopedic Tech Progress Note Patient Details:  [name] 07/24/1961 [id number]  [name], male   DOB: Jan 27, 1961, 56 y.o.   MRN: [id number]   [name] 12/15/2016, 11:41 AM Called in advanced brace order; spoke with Carolyne Fiscal

## 2016-12-15 NOTE — Progress Notes (Signed)
[facility] PHYSICAL MEDICINE & REHABILITATION     PROGRESS NOTE  Subjective/Complaints:  Has a mild cough. No other issues. Feels that he might be getitng a little stronger  ROS: pt denies nausea, vomiting, diarrhea, cough, shortness of breath or chest pain      Objective: Vital Signs: Blood pressure 127/76, pulse 66, temperature 99.7 F (37.6 C), temperature source Oral, resp. rate 19, height 6\' 7"  (2.007 m), weight 115.9 kg (255 lb 8 oz), SpO2 98 %. No results found. No results for input(s): WBC, HGB, HCT, PLT in the last 72 hours. No results for input(s): NA, K, CL, GLUCOSE, BUN, CREATININE, CALCIUM in the last 72 hours.  Invalid input(s): CO CBG (last 3)  No results for input(s): GLUCAP in the last 72 hours.  Wt Readings from Last 3 Encounters:  12/08/16 115.9 kg (255 lb 8 oz)  11/25/16 122.8 kg (270 lb 11.2 oz)  11/03/16 117 kg (258 lb)    Physical Exam:  BP 127/76 (BP Location: Left Arm)   Pulse 66   Temp 99.7 F (37.6 C) (Oral)   Resp 19   Ht 6\' 7"  (2.007 m)   Wt 115.9 kg (255 lb 8 oz)   SpO2 98%   BMI 28.78 kg/m  Constitutional: NAD. Looks a little pale HENT: Normocephalic. Atraumatic Eyes: EOM are normal. No discharge.  Cardiovascular:RRR Respiratory:  CTA B GI: Soft. Bowel sounds are normal.  Musculoskeletal: 1+ LE edema.  Neurological: He is alert and oriented.  Sensation diminished to light touch RLE 1 to 1+/2 Motor: B/l UE 5/5 proximal to distal LLE: 4-/5 HF, KE, 4/5 ADP/PF RLE: 2+/5 HF, 2/5 KE, 1-2/5 ADP/PF--stable Skin: wound clean with steristrips Psychiatric: He has a normal mood and affect. His behavior is normal.   Assessment/Plan: 1. Functional deficits secondary to lumbar stenosis with radiculopathy status post decompression L-2-L5 which require 3+ hours per day of interdisciplinary therapy in a comprehensive inpatient rehab setting. Physiatrist is providing close team supervision and 24 hour management of active medical problems listed  below. Physiatrist and rehab team continue to assess barriers to discharge/monitor patient progress toward functional and medical goals.  Function:  Bathing Bathing position   Position: Sitting EOB  Bathing parts Body parts bathed by patient: Right arm, Left arm, Chest, Abdomen, Buttocks, Front perineal area Body parts bathed by helper: Buttocks  Bathing assist Assist Level: Supervision or verbal cues, Set up   Set up : To obtain items  Upper Body Dressing/Undressing Upper body dressing   What is the patient wearing?: Pull over shirt/dress, Orthosis     Pull over shirt/dress - Perfomed by patient: Thread/unthread right sleeve, Thread/unthread left sleeve, Put head through opening, Pull shirt over trunk       Orthosis activity level: Performed by patient  Upper body assist Assist Level: Set up   Set up : To obtain clothing/put away  Lower Body Dressing/Undressing Lower body dressing   What is the patient wearing?: Pants, [employer], Shoes, Underwear Underwear - Performed by patient: Thread/unthread right underwear leg, Thread/unthread left underwear leg, Pull underwear up/down Underwear - Performed by helper: Pull underwear up/down Pants- Performed by patient: Thread/unthread right pants leg, Thread/unthread left pants leg, Pull pants up/down Pants- Performed by helper: Pull pants up/down Non-skid slipper socks- Performed by patient: Don/doff right sock, Don/doff left sock (sok aid) Non-skid slipper socks- Performed by helper: Don/doff right sock, Don/doff left sock       Shoes - Performed by helper: Don/doff right shoe, Don/doff  left shoe       TED Hose - Performed by helper: Don/doff right TED hose, Don/doff left TED hose  Lower body assist Assist for lower body dressing: Supervision or verbal cues      Toileting Toileting Toileting activity did not occur: Refused Toileting steps completed by patient: Adjust clothing prior to toileting Toileting steps completed by  helper: Adjust clothing prior to toileting, Performs perineal hygiene, Adjust clothing after toileting Toileting Assistive Devices: Grab bar or rail  Toileting assist Assist level: Touching or steadying assistance (Pt.75%)   Transfers Chair/bed transfer   Chair/bed transfer method: Squat pivot Chair/bed transfer assist level: Supervision or verbal cues Chair/bed transfer assistive device: Armrests Mechanical lift: Stedy   Locomotion Ambulation Ambulation activity did not occur: Safety/medical concerns   Max distance: 8' Assist level: Moderate assist (Pt 50 - 74%)   Wheelchair   Type: Manual Max wheelchair distance: 150 ft Assist Level: Supervision or verbal cues  Cognition Comprehension Comprehension assist level: Follows complex conversation/direction with no assist  Expression Expression assist level: Expresses complex ideas: With no assist  Social Interaction Social Interaction assist level: Interacts appropriately 75 - 89% of the time - Needs redirection for appropriate language or to initiate interaction.  Problem Solving Problem solving assist level: Solves complex problems: Recognizes & self-corrects  Memory Memory assist level: Complete Independence: No helper    Medical Problem List and Plan: 1.  Decreased functional mobility secondary to lumbar stenosis with radiculopathy status post decompression L-2-L5. Back brace when out of bed  Continue CIR therapies--team conference  -Consult [employer] for knee brace  -  2.  DVT Prophylaxis/Anticoagulation: SCDs.   Vascular study negative 3. Pain Management: Robaxin and oxycodone as needed  -appears controlled at present  -has mild dysesthesias RLE   -right ribcage pain improved   -continue ice, analgesics prn. Pad for brace (towel) 4. Mood: Provide emotional support 5. Neuropsych: This patient is capable of making decisions on his own behalf. 6. Skin/Wound Care: Routine skin checks. Monitor back incision. 7.  Fluids/Electrolytes/Nutrition: encourage PO  -BUN with improvement   8. Hypertension. (Aldactone 25 mg daily, HCTZ 25 mg daily----held), Avapro 300 mg daily, Norvasc 5 mg daily.   -bp's controlled at present  -continue norvasc   -will resume hctz to better controle LE edema     9. Prediabetic. Hemoglobin A1c 5.3. Blood sugar checks discontinued 10. Constipation. Laxative assistance with results   LOS (Days) 11 A FACE TO FACE EVALUATION WAS PERFORMED  Amour Cutrone T 12/15/2016 8:49 AM

## 2016-12-15 NOTE — Progress Notes (Signed)
Occupational Therapy Session Note  Patient Details  Name: [name] MRN: [id number] Date of Birth: [birth year]  Today's Date: 12/15/2016 OT Individual Time: 1345-1500 OT Individual Time Calculation (min): 75 min    Short Term Goals: Week 2:  OT Short Term Goal 1 (Week 2): Pt wll complete stand pivot transfer to toilet using RW with mod A OT Short Term Goal 2 (Week 2): Pt will stand to complete buttock hygiene with steadying assist OT Short Term Goal 3 (Week 2): Pt will pull pants up during LB dressing with mod A to stand at RW and steadying assist for dynamic balance  Skilled Therapeutic Interventions/Progress Updates:    Pt seen for OT session focusing on LE strengthening and activity tolerance in prep for self-care tasks. Pt sitting up in w/c upon arrival, denying pain and agreeable to tx session. He self propelled w/c to therapy gym mod I for UE strengthening.   He completed supervision squat pivot transfers throughout session with guarding assist- able to manage w/c parts independently.  From elevated EOM, pt completed knee raises on/off ball with steadying assist at ankle. Pt requiring min A for control with R LE.   x10 hamstring curls from supine position for each LE. Steadying assist at knee and foot on L LE, pt able to actively curl through full ROM without assist.  Pt required max A to initate knee/ hip flexion on R, able to extend with steadying assist at ankle and knee. Pt able to complete 10 reps on L side without need for rest break, requiring 3 rest breaks in order to complete 10 reps when completing on R.   Completed x2 sets of push ups along EOM and UE strengthening in prep for functional transfers and w/c propulsion.  x3 sit <> stands, attempted 3 times before successfully coming into upright stance with mod-max A and support at R knee. Pt requiring B UE support on RW during standing.   Pt returned to room at end of session, left seated in w/c- all needs in reach.     Therapy Documentation Precautions:  Precautions Precautions: Back, Fall Required Braces or Orthoses: Spinal Brace Spinal Brace: Lumbar corset, Applied in standing position Restrictions Weight Bearing Restrictions: No Pain:   No/ denies pain ADL: ADL ADL Comments: Please see functional navigator for ADL status  See Function Navigator for Current Functional Status.   Therapy/Group: Individual Therapy  Lewis, Sabre Leonetti C 12/15/2016, 7:18 AM

## 2016-12-15 NOTE — Progress Notes (Signed)
Physical Therapy Session Note  Patient Details  Name: [name] MRN: [id number] Date of Birth: 1961-08-09  Today's Date: 12/15/2016 PT Individual Time: 1100-1155 PT Individual Time Calculation (min): 55 min   Short Term Goals: Week 2:  PT Short Term Goal 1 (Week 2): Pt will perform bed mobility with S PT Short Term Goal 2 (Week 2): Pt will perform sit <>stand with consistent minA from w/c seat height PT Short Term Goal 3 (Week 2): Pt will perform stand pivot transfer w/c <>bed with minA PT Short Term Goal 4 (Week 2): Pt will ambulate x10' with modA and RW PT Short Term Goal 5 (Week 2): Pt will initiate stair training  Skilled Therapeutic Interventions/Progress Updates: Pt received seated in w/c; denies pain and agreeable to treatment. W/c propulsion to/from gym with BUE for strengthening and endurance with modI. Sit <>stand in parallel bars x4 trials with min guard and heavy reliance on UEs. Walking in place, and walking forward/backward x5' at a time with modA for facilitation at R quads/glutes for stance control. Note some improvement in RLE activation for stance control however not consistent and unable to support full body weight without heavy reliance on UEs. Partial sit <>stand in standing frame 2x10 reps with BUE elbow support and support on hands with extended elbows; pt reports no change in effectiveness of exercise based on UE placement. Attempted sit<>stand with eva walker however poor LE extension even with heavy weight bearing through UEs. Attempted sit <>stand in modified plantigrade, unable to clear hips from w/c. Remained seated in w/c at end of session, all needs in reach.      Therapy Documentation Precautions:  Precautions Precautions: Back, Fall Required Braces or Orthoses: Spinal Brace Spinal Brace: Lumbar corset, Applied in standing position Restrictions Weight Bearing Restrictions: No   See Function Navigator for Current Functional Status.   Therapy/Group:  Individual Therapy  Luberta Mutter 12/15/2016, 12:34 PM

## 2016-12-15 NOTE — Progress Notes (Signed)
Occupational Therapy Session Note  Patient Details  Name: [name] MRN: [id number] Date of Birth: [birth year]  Today's Date: 12/15/2016 OT Individual Time: 3790-2409 OT Individual Time Calculation (min): 73 min    Short Term Goals: Week 2:  OT Short Term Goal 1 (Week 2): Pt wll complete stand pivot transfer to toilet using RW with mod A OT Short Term Goal 2 (Week 2): Pt will stand to complete buttock hygiene with steadying assist OT Short Term Goal 3 (Week 2): Pt will pull pants up during LB dressing with mod A to stand at RW and steadying assist for dynamic balance  Skilled Therapeutic Interventions/Progress Updates:    Pt seen this session to facilitate functional mobility and use of AE. Pt agreeable to a shower, but did not like sitting on BSC.  A wider shower chair brought in for patient.  Pt used leg lifter to sit to EOB with extra time and then needed to sit for a few minutes. Completed sq pivot to wc with extra time and close S.  Used bars in shower to transfer to seat with squat pivot with A to adjust feet.  Used long sponge for feet and bars to pull into a stand for therapist to cleanse his bottom.  Completed dressing from w/c level using reacher for pants. It takes pt a considerable amount of time to to don over feet due to severe weakness to lift his feet off the floor. Pt needed full A with shoes.  To don pants over hips from w/c, used a chair push up to elevate hips so therapist could assist with pulling pants over hips.  Pt then practiced 10 reps of chair pushups. Encouraged pt to work on these in therapy to build leg strength.  Pt set up with leg rests so he could move chair in room on his own to complete grooming. Pt with all needs met.  Therapy Documentation Precautions:  Precautions Precautions: Back, Fall Required Braces or Orthoses: Spinal Brace Spinal Brace: Lumbar corset, Applied in standing position Restrictions Weight Bearing Restrictions: No    Vital  Signs: Therapy Vitals Temp: 99.7 F (37.6 C) Temp Source: Oral Pulse Rate: 66 Resp: 19 BP: 127/76 Patient Position (if appropriate): Lying Oxygen Therapy SpO2: 98 % O2 Device: Not Delivered Pain:   no c/o pain ADL: ADL ADL Comments: Please see functional navigator for ADL status  See Function Navigator for Current Functional Status.   Therapy/Group: Individual Therapy  SAGUIER,JULIA 12/15/2016, 8:29 AM

## 2016-12-15 NOTE — Progress Notes (Signed)
Social Work [name], male   DOB: 1961-02-17, 56 y.o.   MRN: [id number]   Met with pt yesterday afternoon to review team conference.  He is aware and agreeable with continued target d/c date of 2/6.  He feels he is continuing to make progress.  He denies any concerns at this time.  Continue to follow.  Christyana Corwin, LCSW

## 2016-12-15 NOTE — Patient Care Conference (Signed)
Inpatient RehabilitationTeam Conference and Plan of Care Update Date: 12/14/2016   Time: 2:05 PM    Patient Name: [name]      Medical Record Number: [id number]  Date of Birth: Dec 26, 1960 Sex: Male         Room/Bed: 4M10C/4M10C-01 Payor Info: Payor: [employer] / Plan: Market researcher / Product Type: *No Product type* /    Admitting Diagnosis: Thoraic Myelopathy  Admit Date/Time:  12/04/2016  3:49 PM Admission Comments: No comment available   Primary Diagnosis:  Lumbar radiculopathy Principal Problem: Lumbar radiculopathy  Patient Active Problem List   Diagnosis Date Noted  . Prerenal azotemia 12/09/2016  . Lumbar radiculopathy 12/04/2016  . Surgery, elective   . Weakness of both legs   . Post-operative pain   . Benign essential HTN   . Prediabetes   . Constipation due to pain medication   . Back pain 12/01/2016  . Bradycardia 07/17/2014  . Syncope 07/15/2014    Expected Discharge Date: Expected Discharge Date: 12/28/16  Team Members Present: Physician leading conference: Dr. Alger Simons Social Worker Present: Lennart Pall, LCSW Nurse Present: Dorien Chihuahua, RN PT Present: Kem Parkinson, Rachel Moulds, PT OT Present: Napoleon Form, OT PPS Coordinator present : Daiva Nakayama, RN, CRRN     Current Status/Progress Goal Weekly Team Focus  Medical   right leg still quite weak. will need knee brace. will have prolonged recovery  see prior  pain control, bp mgt   Bowel/Bladder   Continent of bowel/bladder. LBM 12/13/16  monitor  Establish a regular bowel pattern   Swallow/Nutrition/ Hydration             ADL's   Mod A LB dressing Setup- mod I UB bathing/dressing; mod-max sit <> stand at RW; supervision squat pivot transfers  Supervision-min A overall  Functional standing tolerance/ balance, ADL re-training, neuro re-ed   Mobility   S bed mobility, mod/max sit <>stand, minA gait in parallel bars UE dominant, modI w/c  minA bed mobility, car transfers, stairs; S w/c <>bed  transfers, ambulation  LE NMR/strengthening, sit <>stand, transfer training, pre-gait   Communication             Safety/Cognition/ Behavioral Observations            Pain   No C/O pain  <3  Assess and treat pain q shift and as needed   Skin   Lumbar vincision with dry dressing  No additional skin breakdown  Assess vskin q shift and as needed    Rehab Goals Patient on target to meet rehab goals: Yes *See Care Plan and progress notes for long and short-term goals.  Barriers to Discharge: profound right knee extensor and hip flexor weakness    Possible Resolutions to Barriers:  orthotics, continued NMR, AE    Discharge Planning/Teaching Needs:  Plan home with wife and daughters who can provide any needed assistance.  Teaching to be scheduled with family.   Team Discussion:  Knee nees some stabilization - MD to order.  Limited progress this week especially with sit to stand strength.  Would benefit from having some higher surfaces to sit on at home given his height.  Plan lateral leans for completing ADLs vs while standing.  May need to decreased gait goal which is currently at supervision.  Pt remains very motivated.  Revisions to Treatment Plan:  None at this time.   Continued Need for Acute Rehabilitation Level of Care: The patient requires daily medical management by a physician with  specialized training in physical medicine and rehabilitation for the following conditions: Daily direction of a multidisciplinary physical rehabilitation program to ensure safe treatment while eliciting the highest outcome that is of practical value to the patient.: Yes Daily medical management of patient stability for increased activity during participation in an intensive rehabilitation regime.: Yes Daily analysis of laboratory values and/or radiology reports with any subsequent need for medication adjustment of medical intervention for : Neurological problems;Post surgical problems  Cassadie Pankonin,  Kairyn Olmeda 12/15/2016, 10:48 AM

## 2016-12-16 ENCOUNTER — Inpatient Hospital Stay (HOSPITAL_COMMUNITY): Payer: Managed Care, Other (non HMO) | Admitting: Occupational Therapy

## 2016-12-16 ENCOUNTER — Inpatient Hospital Stay (HOSPITAL_COMMUNITY): Payer: Managed Care, Other (non HMO) | Admitting: Physical Therapy

## 2016-12-16 LAB — BASIC METABOLIC PANEL
Anion gap: 5 (ref 5–15)
BUN: 17 mg/dL (ref 6–20)
CHLORIDE: 108 mmol/L (ref 101–111)
CO2: 26 mmol/L (ref 22–32)
CREATININE: 0.93 mg/dL (ref 0.61–1.24)
Calcium: 8.8 mg/dL — ABNORMAL LOW (ref 8.9–10.3)
GFR calc Af Amer: >60 mL/min (ref >60)
GFR calc non Af Amer: >60 mL/min (ref >60)
Glucose, Bld: 95 mg/dL (ref 65–99)
Potassium: 3.9 mmol/L (ref 3.5–5.1)
Sodium: 139 mmol/L (ref 135–145)

## 2016-12-16 NOTE — Progress Notes (Signed)
[facility] PHYSICAL MEDICINE & REHABILITATION     PROGRESS NOTE  Subjective/Complaints:  No new issues. Wondered how long his brace needs to be on. Feels that it's too small  ROS: pt denies nausea, vomiting, diarrhea, cough, shortness of breath or chest pain       Objective: Vital Signs: Blood pressure 127/74, pulse 67, temperature 99.3 F (37.4 C), temperature source Oral, resp. rate 20, height 6\' 7"  (2.007 m), weight 116.5 kg (256 lb 13.4 oz), SpO2 99 %. No results found. No results for input(s): WBC, HGB, HCT, PLT in the last 72 hours.  Recent Labs  12/16/16 0443  NA 139  K 3.9  CL 108  GLUCOSE 95  BUN 17  CREATININE 0.93  CALCIUM 8.8*   CBG (last 3)  No results for input(s): GLUCAP in the last 72 hours.  Wt Readings from Last 3 Encounters:  12/15/16 116.5 kg (256 lb 13.4 oz)  11/25/16 122.8 kg (270 lb 11.2 oz)  11/03/16 117 kg (258 lb)    Physical Exam:  BP 127/74 (BP Location: Left Arm)   Pulse 67   Temp 99.3 F (37.4 C) (Oral)   Resp 20   Ht 6\' 7"  (2.007 m)   Wt 116.5 kg (256 lb 13.4 oz)   SpO2 99%   BMI 28.93 kg/m  Constitutional: NAD. Looks a little pale HENT: Normocephalic. Atraumatic Eyes: EOM are normal. No discharge.  Cardiovascular:RRR no jvd Respiratory:  CTA B GI: Soft. Bowel sounds are normal.  Musculoskeletal: 1+ LE edema.  Neurological: He is alert and oriented.  Sensation diminished to light touch RLE 1 to 1+/2 Motor: B/l UE 5/5 proximal to distal LLE: 4-/5 HF, KE, 4/5 ADP/PF RLE: 2+/5 HF, 2/5 KE, 1-2/5 ADP/PF--no change Skin: wound clean with steristrips Psychiatric: flat but appropriate   Assessment/Plan: 1. Functional deficits secondary to lumbar stenosis with radiculopathy status post decompression L-2-L5 which require 3+ hours per day of interdisciplinary therapy in a comprehensive inpatient rehab setting. Physiatrist is providing close team supervision and 24 hour management of active medical problems listed  below. Physiatrist and rehab team continue to assess barriers to discharge/monitor patient progress toward functional and medical goals.  Function:  Bathing Bathing position   Position: Shower  Bathing parts Body parts bathed by patient: Right arm, Left arm, Chest, Abdomen, Front perineal area, Right lower leg, Left lower leg, Right upper leg, Left upper leg Body parts bathed by helper: Buttocks  Bathing assist Assist Level: Supervision or verbal cues, Set up   Set up : To obtain items  Upper Body Dressing/Undressing Upper body dressing   What is the patient wearing?: Pull over shirt/dress, Orthosis     Pull over shirt/dress - Perfomed by patient: Thread/unthread right sleeve, Thread/unthread left sleeve, Put head through opening, Pull shirt over trunk       Orthosis activity level: Performed by patient  Upper body assist Assist Level: Set up   Set up : To obtain clothing/put away  Lower Body Dressing/Undressing Lower body dressing   What is the patient wearing?: Pants, [employer], Underwear, Shoes Underwear - Performed by patient: Thread/unthread right underwear leg, Thread/unthread left underwear leg Underwear - Performed by helper: Pull underwear up/down Pants- Performed by patient: Thread/unthread right pants leg, Thread/unthread left pants leg Pants- Performed by helper: Pull pants up/down Non-skid slipper socks- Performed by patient: Don/doff right sock, Don/doff left sock (sok aid) Non-skid slipper socks- Performed by helper: Don/doff right sock, Don/doff left sock  Shoes - Performed by helper: Don/doff right shoe, Don/doff left shoe       TED Hose - Performed by helper: Don/doff right TED hose, Don/doff left TED hose  Lower body assist Assist for lower body dressing: Supervision or verbal cues      Toileting Toileting Toileting activity did not occur: Refused Toileting steps completed by patient: Adjust clothing prior to toileting Toileting steps completed  by helper: Adjust clothing prior to toileting, Performs perineal hygiene, Adjust clothing after toileting Toileting Assistive Devices: Grab bar or rail  Toileting assist Assist level: Touching or steadying assistance (Pt.75%)   Transfers Chair/bed transfer   Chair/bed transfer method: Squat pivot Chair/bed transfer assist level: Supervision or verbal cues Chair/bed transfer assistive device: Armrests Mechanical lift: Stedy   Locomotion Ambulation Ambulation activity did not occur: Safety/medical concerns   Max distance: 8' Assist level: Moderate assist (Pt 50 - 74%)   Wheelchair   Type: Manual Max wheelchair distance: 150 ft Assist Level: Supervision or verbal cues  Cognition Comprehension Comprehension assist level: Follows complex conversation/direction with no assist  Expression Expression assist level: Expresses complex ideas: With no assist  Social Interaction Social Interaction assist level: Interacts appropriately with others - No medications needed.  Problem Solving Problem solving assist level: Solves complex problems: Recognizes & self-corrects  Memory Memory assist level: Complete Independence: No helper    Medical Problem List and Plan: 1.  Decreased functional mobility secondary to lumbar stenosis with radiculopathy status post decompression L-2-L5. Back brace when out of bed  Continue CIR therapies--team conference  -[employer] to deliver hinged,locking knee brace today  -?bigger brace  2.  DVT Prophylaxis/Anticoagulation: SCDs.   Vascular study negative 3. Pain Management: Robaxin and oxycodone as needed  -appears controlled at present  -has mild dysesthesias RLE   -right ribcage pain improved   -continue ice, analgesics prn. Pad for brace (towel) 4. Mood: Provide emotional support 5. Neuropsych: This patient is capable of making decisions on his own behalf. 6. Skin/Wound Care: Routine skin checks. Monitor back incision. 7. Fluids/Electrolytes/Nutrition:  encourage PO  -BUN with improvement   8. Hypertension. (Aldactone 25 mg daily, HCTZ 25 mg daily----held), Avapro 300 mg daily, Norvasc 5 mg daily.   -bp's normotensive  -continue norvasc   -resumed hctz to better control LE edema     9. Prediabetic. Hemoglobin A1c 5.3. Blood sugar checks discontinued 10. Constipation. Laxative assistance with results   LOS (Days) 12 A FACE TO FACE EVALUATION WAS PERFORMED  [name],[name] 12/16/2016 8:21 AM

## 2016-12-16 NOTE — Progress Notes (Signed)
Physical Therapy Session Note  Patient Details  Name: [name] MRN: [id number] Date of Birth: June 08, 1961  Today's Date: 12/16/2016 PT Individual Time: 1000-1100 PT Individual Time Calculation (min): 60 min   Short Term Goals: Week 2:  PT Short Term Goal 1 (Week 2): Pt will perform bed mobility with S PT Short Term Goal 2 (Week 2): Pt will perform sit <>stand with consistent minA from w/c seat height PT Short Term Goal 3 (Week 2): Pt will perform stand pivot transfer w/c <>bed with minA PT Short Term Goal 4 (Week 2): Pt will ambulate x10' with modA and RW PT Short Term Goal 5 (Week 2): Pt will initiate stair training  Skilled Therapeutic Interventions/Progress Updates: Pt received seated in w/c, denies pain and agreeable to treatment. W/c propulsion to/from gym with BUE and modI for strengthening and endurance. Sit <>stand in parallel bars min guard and heavy UE reliance. Forward/backward walking x4-5 steps in either direction with min guard; facilitation at R glutes/quads for stance control. E-stim to R quad for strengthening and NMR; note 10 degrees knee extension with e-stim only, improved to 45 degrees knee extension ROM in SAQ when pt cued to assist with knee extension. Orthotist present for later part of session for fit of drop-lock knee extension brace. Decreased reliance on PT for knee extension and stance control however still heavy reliance on UEs due to "give" in knee brace d/t nature of material. Orthotist recommends combining knee brace with GRAFO; plan to try in next session. Returned to room as above; remained seated in w/c all needs in reach.      Therapy Documentation Precautions:  Precautions Precautions: Back, Fall Required Braces or Orthoses: Spinal Brace Spinal Brace: Applied in sitting position, Lumbar corset Restrictions Weight Bearing Restrictions: No Pain: Pain Assessment Pain Assessment: No/denies pain  See Function Navigator for Current Functional  Status.   Therapy/Group: Individual Therapy  Luberta Mutter 12/16/2016, 4:25 PM

## 2016-12-16 NOTE — Progress Notes (Signed)
Occupational Therapy Session Note  Patient Details  Name: [name] MRN: [id number] Date of Birth: 1961/09/04  Today's Date: 12/16/2016 OT Individual Time: 1450-1534 OT Individual Time Calculation (min): 44 min    Skilled Therapeutic Interventions/Progress Updates:    Pt completed transfer from wheelchair to therapy mat with min assist from therapist.  Focused session on sit to stand transitions from elevated mat with therapist providing support in front and stabilizing the RLE.  Pt able to complete sit to stand from elevated mat with max assist and maintain for up to 1 min 15 seconds on 4 attempts.  Integrated small squat to stand transitions after standing with emphasis on knee and hip extension.  Pt became more and more confident with each stand.  He was able to transfer back to the wheelchair with supervision scoot piovt to conclude session.  Pt left at bedside in wheelchair with call button and phone in reach.    Therapy Documentation Precautions:  Precautions Precautions: Back, Fall Required Braces or Orthoses: Spinal Brace Spinal Brace: Applied in sitting position, Lumbar corset Restrictions Weight Bearing Restrictions: No   Pain: Pain Assessment Pain Assessment: No/denies pain ADL: See Function Navigator for Current Functional Status.   Therapy/Group: Individual Therapy  Noa Constante OTR/L 12/16/2016, 4:09 PM

## 2016-12-16 NOTE — Progress Notes (Signed)
Occupational Therapy Session Note  Patient Details  Name: [name] MRN: [id number] Date of Birth: [birth year]  Today's Date: 12/16/2016 OT Individual Time: [id number] Individual Time Calculation (min): 60 min    Short Term Goals: Week 2:  OT Short Term Goal 1 (Week 2): Pt wll complete stand pivot transfer to toilet using RW with mod A OT Short Term Goal 2 (Week 2): Pt will stand to complete buttock hygiene with steadying assist OT Short Term Goal 3 (Week 2): Pt will pull pants up during LB dressing with mod A to stand at RW and steadying assist for dynamic balance  Skilled Therapeutic Interventions/Progress Updates:    Pt seen for OT ssession focusing on functional transfers. Pt in supine upon arrival, denying pain and agreeable to tx session.  He transferred to EOB using hospital bed functions and use of leg lifter. He bathed UB seated EOB with set-up assist, decliing to wash LB. From elevated bed, pt able to thread LEs into pants using reacher with increased time, lateral leans to pull pants up. Practiced simulated toileting task, transferring to bariatric BSC via scoot pivot with guarding assist. Pt voiced  Bariatric BSC being much more comfortable compaired to standard BSC.  Completed lateral leans onto bed side and into w/c to complete clothing management, requiring increased time and multiple leans to fully get pants up. Pt very excited when he was able to fully get pants up without assist. He donned shoes using reacher and LH shoe horn with increased time and min A for positioning of shoes in shoes with elastic shoelaces. VCs provided throughout for problem solving techniques for positioning for increased success.  Pt completed scoot pivot from BSC> w/c with guarding assist. Pt left seated in w/c at end of session, all needs in reach.   Therapy Documentation Precautions:  Precautions Precautions: Back, Fall Required Braces or Orthoses: Spinal Brace Spinal Brace: Lumbar corset,  Applied in standing position Restrictions Weight Bearing Restrictions: No Pain:   no/ denies pain ADL: ADL ADL Comments: Please see functional navigator for ADL status  See Function Navigator for Current Functional Status.   Therapy/Group: Individual Therapy  Lewis, Sayana Salley C 12/16/2016, 7:10 AM

## 2016-12-16 NOTE — Progress Notes (Signed)
Physical Therapy Session Note  Patient Details  Name: [name] MRN: [id number] Date of Birth: [birth year]  Today's Date: 12/16/2016 PT Individual Time: 1601-1630 PT Individual Time Calculation (min): 29 min   Short Term Goals: Week 1:  PT Short Term Goal 1 (Week 1): Pt will perform bed <> chair transfers with max A PT Short Term Goal 1 - Progress (Week 1): Met PT Short Term Goal 2 (Week 1): Pt will perform gait in controlled environment wiht max A x 10' PT Short Term Goal 2 - Progress (Week 1): Progressing toward goal  Skilled Therapeutic Interventions/Progress Updates:     Pt received sitting in WC and agreeable to PT. WC mobility with toweave through 6 cones forwards x 2 at 35f and 560fintervals. Backward x 1 at 6 ft interval min cues from PT for decrease turning radius and WC management in posterior direction.   Kinetron 4 bouts x 45seconds at 80cm/sec with rest breaks between each bout. PT provided min assist at the RLE to stabilize hip and prevent excessive ER.   Patient returned to room and [facility] reach and all needs met.       Therapy Documentation Precautions:  Precautions Precautions: Back, Fall Required Braces or Orthoses: Spinal Brace Spinal Brace: Applied in sitting position, Lumbar corset Restrictions Weight Bearing Restrictions: No Pain: Pain Assessment Pain Assessment: No/denies pain   See Function Navigator for Current Functional Status.   Therapy/Group: Individual Therapy  AuLorie Phenix/25/2018, 5:14 PM

## 2016-12-17 ENCOUNTER — Inpatient Hospital Stay (HOSPITAL_COMMUNITY): Payer: Managed Care, Other (non HMO) | Admitting: Occupational Therapy

## 2016-12-17 ENCOUNTER — Inpatient Hospital Stay (HOSPITAL_COMMUNITY): Payer: Managed Care, Other (non HMO) | Admitting: Physical Therapy

## 2016-12-17 NOTE — Progress Notes (Signed)
Occupational Therapy Session Note  Patient Details  Name: [name] MRN: [id number] Date of Birth: Nov 12, 1961  Today's Date: 12/17/2016 OT Individual Time: [id number] and  1415-1430 OT Individual Time Calculation (min): 60 min and 15 min    Short Term Goals: Week 2:  OT Short Term Goal 1 (Week 2): Pt wll complete stand pivot transfer to toilet using RW with mod A OT Short Term Goal 2 (Week 2): Pt will stand to complete buttock hygiene with steadying assist OT Short Term Goal 3 (Week 2): Pt will pull pants up during LB dressing with mod A to stand at RW and steadying assist for dynamic balance  Skilled Therapeutic Interventions/Progress Updates:   Session 1:  Upon entering in room, Pt seated EOB. Tx focus of session on self-care retraining. Squat pivot transfer EOB>w/c>shower chair with CGA. Pt req min A and VC to manage BLE during functional mobility throughout room. While seated on shower chair, Pt bathes in seated and stands while holding grab bars as therapist assists to wash buttocks and VC to use long handled sponge to wash BLE. Pt CGA for squat pivot transfer shower chair>w/c for balance. Pt threads BLE with reacher and advances pants over hips with VC for lateral lean onto bed with increased time/effort and rest breaks. TOTAL A to don ted hose and P able to don L slip on shoe with reacher after encouragement and TOTAL A to don R shoe. Pt left in in room seated in w/c with food tray and call light within reach.  Session 2: Upon entering room Pt seated in w/c. Anticipating upcoming dc, pt manages w/c leg and arm rests /c MIN A in prep for commode transfer. Educated pt on safe commode placement to decrease distance between w/c. Pt completes squat pivot transfer w/c<>commode ./c supervision and verbal cues to use arms to "pop over" to next surface and decrease risk of shearing.     Therapy Documentation Precautions:  Precautions Precautions: Back, Fall Required Braces or Orthoses: Spinal  Brace Spinal Brace: Applied in sitting position, Lumbar corset Restrictions Weight Bearing Restrictions: No Pain: Pain Assessment Pain Assessment: No/denies pain Pain Score: 0-No pain ADL: ADL ADL Comments: Please see functional navigator for ADL status  See Function Navigator for Current Functional Status.   Therapy/Group: Individual Therapy  Tonny Branch 12/17/2016, 12:35 PM

## 2016-12-17 NOTE — Progress Notes (Signed)
Occupational Therapy Session Note  Patient Details  Name: [name] MRN: [id number] Date of Birth: [birth year]  Today's Date: 12/17/2016 OT Individual Time: 6237-6283 OT Individual Time Calculation (min): 60 min   Short Term Goals: Week 1:  OT Short Term Goal 1 (Week 1): Pt will complete toilet transfer with 1 helper and LRAD OT Short Term Goal 1 - Progress (Week 1): Met OT Short Term Goal 2 (Week 1): Pt will complete LB dressing with Max A and AE PRN OT Short Term Goal 2 - Progress (Week 1): Met OT Short Term Goal 3 (Week 1): Pt will complete sit<stand for LB ADLs with LRAD and 1 helper OT Short Term Goal 3 - Progress (Week 1): Not met OT Short Term Goal 4 (Week 1): Pt will complete bathing with AE and 1 helper OT Short Term Goal 4 - Progress (Week 1): Met   Week 2:  OT Short Term Goal 1 (Week 2): Pt wll complete stand pivot transfer to toilet using RW with mod A OT Short Term Goal 2 (Week 2): Pt will stand to complete buttock hygiene with steadying assist OT Short Term Goal 3 (Week 2): Pt will pull pants up during LB dressing with mod A to stand at RW and steadying assist for dynamic balance  Skilled Therapeutic Interventions/Progress Updates:  Pt found seated in w/c with no complaints of pain. Pt with back brace donned and stated he showered with OT earlier this am. Therapist talked with pt regarding knee brace and AFO in room, pt unsure if PT wanted him to wear AFO during therapy. Pt donned knee brace with minimal assistance. Pt then propelled self from room to therapy gym. Talked with PT regarding AFO and current w/c. Changed out w/c due to current w/c with leg rests that didn't stay locked. Pt transferred w/c to high/low mat with supervision. From here, pt engaged in pushups using pushup foam blocks; 5 sets of 12. Pt then transferred edge of mat to new w/c with supervision and completed 1 set of 12 w/c pushup. Therapist educated pt on correct and safest technique to perform w/c  pushups. Discussed back precautions and educated pt on techniques during ADL to make sure he adhered to precautions at all times. Pt worked on w/c mobility to dayroom and then back to room. At end of session, left pt seated in w/c with all needs within reach.   Therapy Documentation Precautions:  Precautions Precautions: Back, Fall Required Braces or Orthoses: Spinal Brace Spinal Brace: Applied in sitting position, Lumbar corset Restrictions Weight Bearing Restrictions: No  Pain: Pain Assessment Pain Assessment: No/denies pain Pain Score: 0-No pain  Other Treatments:    See Function Navigator for Current Functional Status.  Therapy/Group: Individual Therapy  Chrys Racer , MS, OTR/L, CLT  12/17/2016, 10:39 AM

## 2016-12-17 NOTE — Progress Notes (Signed)
Physical Therapy Session Note  Patient Details  Name: [name] MRN: [id number] Date of Birth: 07-May-1961  Today's Date: 12/17/2016 PT Individual Time: 0700-0800 PT Individual Time Calculation (min): 60 min   Short Term Goals: Week 2:  PT Short Term Goal 1 (Week 2): Pt will perform bed mobility with S PT Short Term Goal 2 (Week 2): Pt will perform sit <>stand with consistent minA from w/c seat height PT Short Term Goal 3 (Week 2): Pt will perform stand pivot transfer w/c <>bed with minA PT Short Term Goal 4 (Week 2): Pt will ambulate x10' with modA and RW PT Short Term Goal 5 (Week 2): Pt will initiate stair training  Skilled Therapeutic Interventions/Progress Updates:  Pt received supine in bed, denies pain and agreeable to treatment. Supine>sit with logroll technique, setupA to obtain leg lifter, and required assist during transfer when pt dropped handle of leg lifter and could not reach to pick it back up. Seated on EOB, pt dons back brace modI, dons shorts setupA with lateral leans to pull pants over hips. TEDs donned totalA; LLE shoe donned with setupA, long handled shoe horn and significantly increased time; RLE shoe and GRAFO donned totalA due to new AFO pt not familiar with. Educated pt on goal/purpose of GRAFO to utilize ground reaction to assist with knee extension as well as improving foot clearance. Requires mod cues for donning R knee drop-lock brace. W/c propulsion 2x175' with BUE for strengthening and endurance, modI. Squat pivot transfer x4 during session w/c <>bed/mat table with close S. Sit <>stand in parallel bars to assess fit/function of RLE GRAFO; note poor weight bearing through R heel decreasing effectiveness of ground reaction plate; modA to boost to stand. Pt setup with e-stim to R quads for NMES at end of session; OT alerted to pt position and scheduled to arrive in 15 min to remove. Instructed pt in precautions and demonstrated how to turn off device if pain or  discomfort occurred. RN alerted to pt position on EOB, all needs in reach.      Therapy Documentation Precautions:  Precautions Precautions: Back, Fall Required Braces or Orthoses: Spinal Brace Spinal Brace: Applied in sitting position, Lumbar corset Restrictions Weight Bearing Restrictions: No   See Function Navigator for Current Functional Status.   Therapy/Group: Individual Therapy  Luberta Mutter 12/17/2016, 8:31 AM

## 2016-12-17 NOTE — Progress Notes (Signed)
[place] PHYSICAL MEDICINE & REHABILITATION     PROGRESS NOTE  Subjective/Complaints:   no new complaints. Brace is "ok".   ROS: pt denies nausea, vomiting, diarrhea, cough, shortness of breath or chest pain       Objective: Vital Signs: Blood pressure 132/74, pulse 66, temperature 99.4 F (37.4 C), temperature source Oral, resp. rate 16, height 6\' 7"  (2.007 m), weight 116.5 kg (256 lb 13.4 oz), SpO2 100 %. No results found. No results for input(s): WBC, HGB, HCT, PLT in the last 72 hours.  Recent Labs  12/16/16 0443  NA 139  K 3.9  CL 108  GLUCOSE 95  BUN 17  CREATININE 0.93  CALCIUM 8.8*   CBG (last 3)  No results for input(s): GLUCAP in the last 72 hours.  Wt Readings from Last 3 Encounters:  12/15/16 116.5 kg (256 lb 13.4 oz)  11/25/16 122.8 kg (270 lb 11.2 oz)  11/03/16 117 kg (258 lb)    Physical Exam:  BP 132/74 (BP Location: Left Arm)   Pulse 66   Temp 99.4 F (37.4 C) (Oral)   Resp 16   Ht 6\' 7"  (2.007 m)   Wt 116.5 kg (256 lb 13.4 oz)   SpO2 100%   BMI 28.93 kg/m  Constitutional: NAD. Looks a little pale HENT: Normocephalic. Atraumatic Eyes: EOM are normal. No discharge.  Cardiovascular:RRR no jvd Respiratory:  CTA B GI: Soft. Bowel sounds are normal.  Musculoskeletal: 1+ LE edema.  Neurological: He is alert and oriented.  Sensation diminished to light touch RLE 1 to 1+/2 Motor: B/l UE 5/5 proximal to distal LLE: 4-/5 HF, KE, 4/5 ADP/PF RLE: 2+/5 HF, 2/5 KE, 1-2/5 ADP/PF-stable at present Skin: wound clean with steristrips Psychiatric: flat but appropriate   Assessment/Plan: 1. Functional deficits secondary to lumbar stenosis with radiculopathy status post decompression L-2-L5 which require 3+ hours per day of interdisciplinary therapy in a comprehensive inpatient rehab setting. Physiatrist is providing close team supervision and 24 hour management of active medical problems listed below. Physiatrist and rehab team continue to assess  barriers to discharge/monitor patient progress toward functional and medical goals.  Function:  Bathing Bathing position   Position: Shower  Bathing parts Body parts bathed by patient: Right arm, Left arm, Chest, Abdomen, Front perineal area, Right lower leg, Left lower leg, Right upper leg, Left upper leg Body parts bathed by helper: Buttocks  Bathing assist Assist Level: Supervision or verbal cues, Set up   Set up : To obtain items  Upper Body Dressing/Undressing Upper body dressing   What is the patient wearing?: Pull over shirt/dress     Pull over shirt/dress - Perfomed by patient: Thread/unthread right sleeve, Thread/unthread left sleeve, Put head through opening, Pull shirt over trunk       Orthosis activity level: Performed by patient  Upper body assist Assist Level: Set up   Set up : To obtain clothing/put away  Lower Body Dressing/Undressing Lower body dressing   What is the patient wearing?: AFO, [employer], Shoes, Pants Underwear - Performed by patient: Thread/unthread right underwear leg, Thread/unthread left underwear leg, Pull underwear up/down Underwear - Performed by helper: Pull underwear up/down Pants- Performed by patient: Thread/unthread right pants leg, Thread/unthread left pants leg, Pull pants up/down Pants- Performed by helper: Pull pants up/down Non-skid slipper socks- Performed by patient: Don/doff right sock, Don/doff left sock (sok aid) Non-skid slipper socks- Performed by helper: Don/doff right sock, Don/doff left sock     Shoes - Performed by  patient: Don/doff right shoe Shoes - Performed by helper: Don/doff left shoe       TED Hose - Performed by helper: Don/doff right TED hose, Don/doff left TED hose  Lower body assist Assist for lower body dressing: Supervision or verbal cues      Toileting Toileting Toileting activity did not occur: Refused Toileting steps completed by patient: Adjust clothing prior to toileting, Performs perineal  hygiene, Adjust clothing after toileting Toileting steps completed by helper: Adjust clothing prior to toileting, Performs perineal hygiene, Adjust clothing after toileting Toileting Assistive Devices: Grab bar or rail  Toileting assist Assist level: Touching or steadying assistance (Pt.75%)   Transfers Chair/bed transfer   Chair/bed transfer method: Squat pivot Chair/bed transfer assist level: Supervision or verbal cues Chair/bed transfer assistive device: Armrests Mechanical lift: Stedy   Locomotion Ambulation Ambulation activity did not occur: Safety/medical concerns   Max distance: 8' Assist level: Moderate assist (Pt 50 - 74%)   Wheelchair   Type: Manual Max wheelchair distance: 259ft  Assist Level: No help, No cues, assistive device, takes more than reasonable amount of time  Cognition Comprehension Comprehension assist level: Follows complex conversation/direction with no assist  Expression Expression assist level: Expresses complex ideas: With no assist  Social Interaction Social Interaction assist level: Interacts appropriately with others - No medications needed.  Problem Solving Problem solving assist level: Solves complex problems: Recognizes & self-corrects  Memory Memory assist level: Complete Independence: No helper    Medical Problem List and Plan: 1.  Decreased functional mobility secondary to lumbar stenosis with radiculopathy status post decompression L-2-L5. Back brace when out of bed  Continue CIR therapies--team conference  -pt has received hinged/locking knee brace. Continue to work with therapy on utilization  2.  DVT Prophylaxis/Anticoagulation: SCDs.   Vascular study negative 3. Pain Management: Robaxin and oxycodone as needed  -appears controlled at present  -has mild dysesthesias RLE   -right ribcage pain improved   -continue ice, analgesics prn.   4. Mood: Provide emotional support 5. Neuropsych: This patient is capable of making decisions on his  own behalf. 6. Skin/Wound Care: Routine skin checks. Monitor back incision. 7. Fluids/Electrolytes/Nutrition: encourage PO  -BUN with improvement   8. Hypertension. Aldactone 25 mg daily, HCTZ 25 mg daily, Avapro 300 mg daily, Norvasc 5 mg daily.   -bp's stable  -continue norvasc  -resumed hctz to better control LE edema  9. Prediabetic. Hemoglobin A1c 5.3. Blood sugar checks discontinued 10. Constipation. Laxative assistance with results   LOS (Days) 13 A FACE TO FACE EVALUATION WAS PERFORMED  Gatha Mcnulty T 12/17/2016 9:32 AM

## 2016-12-18 ENCOUNTER — Inpatient Hospital Stay (HOSPITAL_COMMUNITY): Payer: Managed Care, Other (non HMO)

## 2016-12-18 LAB — GLUCOSE, CAPILLARY: Glucose-Capillary: 81 mg/dL (ref 65–99)

## 2016-12-18 NOTE — Progress Notes (Signed)
[name] is a 56 y.o. male 11/30/1960 [id number]  Subjective: No new complaints. No new problems. Slept well. Feeling OK.  Objective: Vital signs in last 24 hours: Temp:  [99 F (37.2 C)] 99 F (37.2 C) (01/27 0528) Pulse Rate:  [66] 66 (01/27 0528) Resp:  [18] 18 (01/27 0528) BP: (126-131)/(68-77) 131/77 (01/27 0758) SpO2:  [98 %] 98 % (01/27 0528) Weight change:  Last BM Date: 12/13/16 (per last charted BM)  Intake/Output from previous day: 01/26 0701 - 01/27 0700 In: 720 [P.O.:720] Out: 700 [Urine:700] Last cbgs: CBG (last 3)   Recent Labs  12/18/16 1215  GLUCAP 81     Physical Exam General: No apparent distress   HEENT: not dry Lungs: Normal effort. Lungs clear to auscultation, no crackles or wheezes. Cardiovascular: Regular rate and rhythm, no edema Abdomen: S/NT/ND; BS(+) Musculoskeletal:  unchanged Neurological: No new neurological deficits Wounds: N/A    Skin: clear   Mental state: Alert, oriented, cooperative    Lab Results: BMET    Component Value Date/Time   NA 139 12/16/2016 0443   NA 138 11/03/2016 1653   K 3.9 12/16/2016 0443   CL 108 12/16/2016 0443   CO2 26 12/16/2016 0443   GLUCOSE 95 12/16/2016 0443   BUN 17 12/16/2016 0443   BUN 14 11/03/2016 1653   CREATININE 0.93 12/16/2016 0443   CALCIUM 8.8 (L) 12/16/2016 0443   GFRNONAA >60 12/16/2016 0443   GFRAA >60 12/16/2016 0443   CBC    Component Value Date/Time   WBC 9.6 12/08/2016 1146   RBC 4.40 12/08/2016 1146   HGB 12.4 (L) 12/08/2016 1146   HCT 37.2 (L) 12/08/2016 1146   HCT 41.4 11/03/2016 1653   PLT 173 12/08/2016 1146   PLT 188 11/03/2016 1653   MCV 84.5 12/08/2016 1146   MCV 83 11/03/2016 1653   MCH 28.2 12/08/2016 1146   MCHC 33.3 12/08/2016 1146   RDW 14.3 12/08/2016 1146   RDW 14.4 11/03/2016 1653   LYMPHSABS 1.9 12/06/2016 0359   LYMPHSABS 1.6 11/03/2016 1653   MONOABS 0.8 12/06/2016 0359   EOSABS 0.3 12/06/2016 0359   EOSABS 0.2 11/03/2016 1653   BASOSABS 0.0 12/06/2016 0359   BASOSABS 0.0 11/03/2016 1653    Studies/Results: No results found.  Medications: I have reviewed the patient's current medications.  Assessment/Plan:   1. Lumbar stenosis Cont w/CIR 2. DVT proph - cont w/SCD 3. LBP: Robaxin, Oxycodone prn             -appears controlled at present             -has mild dysesthesias RLE              -right ribcage pain improved                         -continue ice, analgesics prn.   4. Mood: Provide emotional support 5. Neuropsych: This patient is capable of making decisions on his own behalf. 6. Skin/Wound Care: Routine skin checks. Monitor back incision. 7. Fluids/Electrolytes/Nutrition: encourage PO             -BUN with improvement   8. HTN. Aldactone, Norvasc, Avapro 9. DM on diet 10. Constipation       LOC   Length of stay, days: 14  Walker Kehr , MD 12/18/2016, 2:58 PM

## 2016-12-18 NOTE — Progress Notes (Signed)
Occupational Therapy Session Note  Patient Details  Name: [name] MRN: [id number] Date of Birth: [birth year]  Today's Date: 12/18/2016 OT Individual Time: 0915-1000 OT Individual Time Calculation (min): 45 min    Short Term Goals: Week 1:  OT Short Term Goal 1 (Week 1): Pt will complete toilet transfer with 1 helper and LRAD OT Short Term Goal 1 - Progress (Week 1): Met OT Short Term Goal 2 (Week 1): Pt will complete LB dressing with Max A and AE PRN OT Short Term Goal 2 - Progress (Week 1): Met OT Short Term Goal 3 (Week 1): Pt will complete sit<stand for LB ADLs with LRAD and 1 helper OT Short Term Goal 3 - Progress (Week 1): Not met OT Short Term Goal 4 (Week 1): Pt will complete bathing with AE and 1 helper OT Short Term Goal 4 - Progress (Week 1): Met  Skilled Therapeutic Interventions/Progress Updates:    OT session focused on ADL retraining, functional transfers, and activity tolerance. Pt completed squat pivot transfer bed>w/c with CGA and increased time. Completed bathing at sink with min A and dressing with mod A. Pt declined standing today therefore utilized w/c push-ups to assist with buttocks hygiene and management of clothing around hips. At end of session pt left with all needs in reach.   Therapy Documentation Precautions:  Precautions Precautions: Back, Fall Required Braces or Orthoses: Spinal Brace Spinal Brace: Applied in sitting position, Lumbar corset Restrictions Weight Bearing Restrictions: No General:   Vital Signs: Therapy Vitals BP: 131/77 Pain:   ADL: ADL ADL Comments: Please see functional navigator for ADL status Exercises:   Other Treatments:    See Function Navigator for Current Functional Status.   Therapy/Group: Individual Therapy  Duayne Cal 12/18/2016, 11:06 AM

## 2016-12-18 NOTE — Progress Notes (Signed)
NT reported patient felt flushed during transfer to bed. BP 89/55; HR 97. Re-check in 30 min BP 98/61; HR 91. Patient states he feels better but a little light-headed. 3rd instance of dizziness, hot flashes following transfer with this RN. Reported to oncoming RN.

## 2016-12-18 NOTE — Plan of Care (Signed)
Problem: SCI BOWEL ELIMINATION Goal: RH STG MANAGE BOWEL WITH ASSISTANCE STG Manage Bowel with Assistance. Mod I  Outcome: Not Progressing Requiring sorbitol for bowel movements

## 2016-12-19 ENCOUNTER — Inpatient Hospital Stay (HOSPITAL_COMMUNITY): Payer: Managed Care, Other (non HMO) | Admitting: *Deleted

## 2016-12-19 MED ORDER — VITAMIN D 1000 UNITS PO TABS
2000.0000 [IU] | ORAL_TABLET | Freq: Every day | ORAL | Status: DC
  Administered 2016-12-19 – 2016-12-28 (×10): 2000 [IU] via ORAL
  Filled 2016-12-19 (×11): qty 2

## 2016-12-19 MED ORDER — IRBESARTAN 300 MG PO TABS
150.0000 mg | ORAL_TABLET | Freq: Every day | ORAL | Status: DC
  Administered 2016-12-20 – 2016-12-28 (×9): 150 mg via ORAL
  Filled 2016-12-19 (×10): qty 1

## 2016-12-19 NOTE — Progress Notes (Signed)
Physical Therapy Session Note  Patient Details  Name: [name] MRN: [id number] Date of Birth: July 15, 1961  Today's Date: 12/19/2016 PT Individual Time: 1415-1455 PT Individual Time Calculation (min): 40 min     Skilled Therapeutic Interventions/Progress Updates:  Patient in w/c agrees to therapy intervention, no pain reported, assisted in donning knee brace-Supervision and max A for applying GRAFO. W/c propulsion to the therapy gym with B  UE.  In II bars sit to stand with pulling on both rails ,mod A for knee lock  on R , training in mini squats and staying in 1/2 squat with manual weight shift to R to increase strength.  Standing and marching with mod to max A for maintaining balance.  Attempted to initiate gait with RW able to take 3 steps with max A and close w/c follow.  At the end of session transferred back to room, left sitting in w/c with all needs within reach.   Therapy Documentation Precautions:  Precautions Precautions: Back, Fall Required Braces or Orthoses: Spinal Brace Spinal Brace: Applied in sitting position, Lumbar corset Restrictions Weight Bearing Restrictions: No   See Function Navigator for Current Functional Status.   Therapy/Group: Individual Therapy  Guadlupe Spanish 12/19/2016, 3:55 PM

## 2016-12-19 NOTE — Progress Notes (Signed)
[name] is a 56 y.o. male Jan 08, 1961 [id number]  Subjective: No new complaints. No new problems. Slept well. Feeling OK.  Objective: Vital signs in last 24 hours: Temp:  [98.8 F (37.1 C)-99 F (37.2 C)] 99 F (37.2 C) (01/28 0529) Pulse Rate:  [61-97] 96 (01/28 0836) Resp:  [17-20] 20 (01/28 0529) BP: (89-118)/(48-78) 104/59 (01/28 0836) SpO2:  [95 %-99 %] 99 % (01/28 0529) Weight change:  Last BM Date: 12/18/16  Intake/Output from previous day: 01/27 0701 - 01/28 0700 In: 360 [P.O.:360] Out: 600 [Urine:600] Last cbgs: CBG (last 3)   Recent Labs  12/18/16 1215  GLUCAP 81     Physical Exam General: No apparent distress   HEENT: not dry Lungs: Normal effort. Lungs clear to auscultation, no crackles or wheezes. Cardiovascular: Regular rate and rhythm, no edema Abdomen: S/NT/ND; BS(+) Musculoskeletal:  unchanged Neurological: No new neurological deficits Wounds: N/A    Skin: clear  Aging changes Mental state: Alert, oriented, cooperative    Lab Results: BMET    Component Value Date/Time   NA 139 12/16/2016 0443   NA 138 11/03/2016 1653   K 3.9 12/16/2016 0443   CL 108 12/16/2016 0443   CO2 26 12/16/2016 0443   GLUCOSE 95 12/16/2016 0443   BUN 17 12/16/2016 0443   BUN 14 11/03/2016 1653   CREATININE 0.93 12/16/2016 0443   CALCIUM 8.8 (L) 12/16/2016 0443   GFRNONAA >60 12/16/2016 0443   GFRAA >60 12/16/2016 0443   CBC    Component Value Date/Time   WBC 9.6 12/08/2016 1146   RBC 4.40 12/08/2016 1146   HGB 12.4 (L) 12/08/2016 1146   HCT 37.2 (L) 12/08/2016 1146   HCT 41.4 11/03/2016 1653   PLT 173 12/08/2016 1146   PLT 188 11/03/2016 1653   MCV 84.5 12/08/2016 1146   MCV 83 11/03/2016 1653   MCH 28.2 12/08/2016 1146   MCHC 33.3 12/08/2016 1146   RDW 14.3 12/08/2016 1146   RDW 14.4 11/03/2016 1653   LYMPHSABS 1.9 12/06/2016 0359   LYMPHSABS 1.6 11/03/2016 1653   MONOABS 0.8 12/06/2016 0359   EOSABS 0.3 12/06/2016 0359   EOSABS 0.2  11/03/2016 1653   BASOSABS 0.0 12/06/2016 0359   BASOSABS 0.0 11/03/2016 1653    Studies/Results: No results found.  Medications: I have reviewed the patient's current medications.  A/P:  1. Lumbar stenosis with paraparesis - continue with inpatient rehabilitation services 2. DVT prophylaxis-continue with SCD 3. Low back pain continue with Robaxin oxycodone when necessary 4. Hypertension on Aldactone and Norvasc and Avapro. The patient was noted to have low blood pressure lately will adjust his meds 5. Diabetes mellitus on diet 6 6. Constipation MiraLAX when necessary 7. Status post gastric sleeve procedure. The patient lost over 200 pounds. He is taking multivitamin a day. Will add vitamin D daily.       Length of stay, days: Henriette , MD 12/19/2016, 9:35 AM

## 2016-12-19 NOTE — Progress Notes (Signed)
Patient bp 104/59; HR 95 at 0800. Notified MD of current bp and last pm episode of lightheadedness on transfer, bp of 89/55. Meds adjusted.

## 2016-12-20 ENCOUNTER — Inpatient Hospital Stay (HOSPITAL_COMMUNITY): Payer: Managed Care, Other (non HMO) | Admitting: Occupational Therapy

## 2016-12-20 ENCOUNTER — Inpatient Hospital Stay (HOSPITAL_COMMUNITY): Payer: Managed Care, Other (non HMO) | Admitting: Physical Therapy

## 2016-12-20 NOTE — Progress Notes (Signed)
Physical Therapy Weekly Progress Note  Patient Details  Name: [name] MRN: [id number] Date of Birth: 08-Apr-1961  Beginning of progress report period: December 13, 2016 End of progress report period: December 20, 2016  Today's Date: 12/20/2016 PT Individual Time: 1100-1130 PT Individual Time Calculation (min): 30 min   Patient has met 0 of 5 short term goals with slow progress towards all goals. Pt limited by significant RLE strength/coordination deficits. Requires close S to minA for bed mobility and transfers, mod/maxA sit <>stand and gait pre-functional x5' or less at a time before fatigued and requiring rest break; poor stance control in RLE however improving RLE progression and BLE activation in stance when progressing RW. Goals downgraded d/t slow progress, and plan for pt to be w/c level upon return home with continued work with follow-up therapy on pre-gait and ambulation.   Patient continues to demonstrate the following deficits muscle weakness and muscle paralysis, decreased cardiorespiratoy endurance and decreased sitting balance, decreased standing balance, decreased postural control, hemiplegia and decreased balance strategies and therefore will continue to benefit from skilled PT intervention to increase functional independence with mobility.  Patient progressing slowly towards long term goals. .  Plan of care revisions: Gait in home d/c, gait in controlled environment downgraded to 10' modA, stair goal changed to family bump up/down in w/c.  PT Short Term Goals Week 2:  PT Short Term Goal 1 (Week 2): Pt will perform bed mobility with S PT Short Term Goal 1 - Progress (Week 2): Not met PT Short Term Goal 2 (Week 2): Pt will perform sit <>stand with consistent minA from w/c seat height PT Short Term Goal 2 - Progress (Week 2): Not met PT Short Term Goal 3 (Week 2): Pt will perform stand pivot transfer w/c <>bed with minA PT Short Term Goal 3 - Progress (Week 2): Not met PT  Short Term Goal 4 (Week 2): Pt will ambulate x10' with modA and RW PT Short Term Goal 4 - Progress (Week 2): Not met PT Short Term Goal 5 (Week 2): Pt will initiate stair training PT Short Term Goal 5 - Progress (Week 2): Not met Week 3:  PT Short Term Goal 1 (Week 3): =LTG due to estimated LOS, downgraded to S/minA overall w/c level  Skilled Therapeutic Interventions/Progress Updates: Pt received seated on BSC with handoff from RN; denies pain and agreeable to treatment. Pt performed pushup on BSC to allow therapist to pull up brief and pants. Squat pivot transfer BSC>w/c min guard. W/c propulsion x175' with modI for strengthening and endurance. Squat pivot transfer w/c >mat table with S. Sit <>stand with maxA; two gait trials x5' each trial mod/maxA for RLE stance control when progressing RW, and +2 w/c follow for safety due to pt fatiguing quickly. Extended rest breaks needed between trials d/t fatigue. Returned to room w/c propulsion modI at end of session.      Therapy Documentation Precautions:  Precautions Precautions: Back, Fall Required Braces or Orthoses: Spinal Brace Spinal Brace: Applied in sitting position, Lumbar corset Restrictions Weight Bearing Restrictions: No  General: PT Missed Time: 15 min d/t toileting   See Function Navigator for Current Functional Status.  Therapy/Group: Individual Therapy  Luberta Mutter 12/20/2016, 7:08 AM

## 2016-12-20 NOTE — Progress Notes (Signed)
Occupational Therapy Session Note  Patient Details  Name: [name] MRN: [id number] Date of Birth: July 16, 1961  Today's Date: 12/20/2016 OT Individual Time: 4098-1191 OT Individual Time Calculation (min): 58 min    Short Term Goals: Week 2:  OT Short Term Goal 1 (Week 2): Pt wll complete stand pivot transfer to toilet using RW with mod A OT Short Term Goal 1 - Progress (Week 2): Not met OT Short Term Goal 2 (Week 2): Pt will stand to complete buttock hygiene with steadying assist OT Short Term Goal 2 - Progress (Week 2): Not met OT Short Term Goal 3 (Week 2): Pt will pull pants up during LB dressing with mod A to stand at RW and steadying assist for dynamic balance OT Short Term Goal 3 - Progress (Week 2): Not met Week 3:  OT Short Term Goal 1 (Week 3): STG=LTG due to LOS     Skilled Therapeutic Interventions/Progress Updates:    Pt seen this session to facilitate strength needed for increase independence with ADLs with transfers, push ups, lateral leans.  Pt received in recliner. Completed squat pivot to w/c with assist to push edge of cushion down as it was difficult for pt to fully clear edge of cushion. Self propelled to gym with 3 short breaks. Transferred with  S to mat. C/o dizziness. Blood pressure 112/79. Pt provided with rest and water.  He could then engage in sit to squat with push up blocks 10x 3, lateral leans to elbow 10 x each side, then lateral leans on elbow using opposite hand to lift thigh 10 x each side.  Maintaining back precautions - dynamic reaching laterally and overhead. Pt needed to toilet. Self propelled back to room with 2-3 quick breaks. Completed smooth squat pivot to drop arm BSC and able to doff clothing with lateral leans. Hand off to nursing to take over care.   Therapy Documentation Precautions:  Precautions Precautions: Back, Fall Required Braces or Orthoses: Spinal Brace Spinal Brace: Applied in sitting position, Lumbar corset Restrictions Weight  Bearing Restrictions: No    Vital Signs: Therapy Vitals BP: 112/79 Patient Position (if appropriate): Sitting Pain: Pain Assessment Pain Assessment: No/denies pain ADL: ADL ADL Comments: Please see functional navigator for ADL status  See Function Navigator for Current Functional Status.   Therapy/Group: Individual Therapy  SAGUIER,JULIA 12/20/2016, 11:09 AM

## 2016-12-20 NOTE — Progress Notes (Signed)
Physical Therapy Session Note  Patient Details  Name: [name] MRN: [id number] Date of Birth: 15-Apr-1961  Today's Date: 12/20/2016 PT Individual Time: 1530-1557 PT Individual Time Calculation (min): 27 min   Short Term Goals: Week 2:  PT Short Term Goal 1 (Week 2): Pt will perform bed mobility with S PT Short Term Goal 2 (Week 2): Pt will perform sit <>stand with consistent minA from w/c seat height PT Short Term Goal 3 (Week 2): Pt will perform stand pivot transfer w/c <>bed with minA PT Short Term Goal 4 (Week 2): Pt will ambulate x10' with modA and RW PT Short Term Goal 5 (Week 2): Pt will initiate stair training  Skilled Therapeutic Interventions/Progress Updates:    no c/o pain.  Session focus on activity tolerance, UE/LE strengthening, and NMR.    Pt propelled w/c to and from therapy gym mod I with 1 short rest break, for activity tolerance and overall mobility.  Squat/pivot w/c>nustep with steady assist for safety.  Pt performed nustep 5 trials of 10-20 cycles at level 4 with BLEs only for strengthening, reciprocal stepping pattern retraining, and endurance.  Pt returned to room at end of session and positioned upright in w/c with call bell in reach and needs met.   Therapy Documentation Precautions:  Precautions Precautions: Back, Fall Required Braces or Orthoses: Spinal Brace Spinal Brace: Applied in sitting position, Lumbar corset Restrictions Weight Bearing Restrictions: No   See Function Navigator for Current Functional Status.   Therapy/Group: Individual Therapy  Earnest Conroy Penven-Crew 12/20/2016, 3:59 PM

## 2016-12-20 NOTE — Plan of Care (Signed)
Problem: RH Balance Goal: LTG Patient will maintain dynamic standing balance (PT) LTG:  Patient will maintain dynamic standing balance with assistance during mobility activities (PT)  Downgraded d/t slow progress, RLE paresis  Problem: RH Ambulation Goal: LTG Patient will ambulate in controlled environment (PT) LTG: Patient will ambulate in a controlled environment, # of feet with assistance (PT).  Downgraded d/t slow progress, RLE paresis Goal: LTG Patient will ambulate in home environment (PT) LTG: Patient will ambulate in home environment, # of feet with assistance (PT).  Outcome: Not Applicable Date Met: 32/95/18 Downgraded d/t slow progress, RLE paresis

## 2016-12-20 NOTE — Progress Notes (Signed)
Occupational Therapy Weekly Progress Note  Patient Details  Name: [name] MRN: [id number] Date of Birth: 16-Jun-1961  Beginning of progress report period: December 13, 2016 End of progress report period: December 20, 2016  Today's Date: 12/20/2016 OT Individual Time: 8416-6063 OT Individual Time Calculation (min): 55 min    Patient has met 0 of 3 short term goals.  Pt making slow but steady progress towards OT goals. [name] standing abilities remain at a non-functional level and therefore have ceased attempts at functional stand pivot transfers or standing ADLs. Pt has demonstrated ability to be  Able to complete lateral leans for clothing and hygiene tasks. Have been in contact with CSW regarding setting up caregiver training for later this week in prep for d/c home next week. Pt cont to display decreased awareness of deficits, asking if he will be going home with a w/c, though he has not completed any functional standing or ambulation while on CIR.   Patient continues to demonstrate the following deficits:  abnormal posture, lumbago (low back pain) and muscle weakness (generalized)and therefore will continue to benefit from skilled OT intervention to enhance overall performance with BADL and Reduce care partner burden.  Patient not progressing toward long term goals.  See goal revision..  Plan of care revisions: Goals have been modified as not to require sit <> stand as pt's standing tolerance/ balance/ abilities not at functional level at this time. Have d/c dynamic standing goal. See POC for goal revisions. .  OT Short Term Goals Week 2:  OT Short Term Goal 1 (Week 2): Pt wll complete stand pivot transfer to toilet using RW with mod A OT Short Term Goal 1 - Progress (Week 2): Not met OT Short Term Goal 2 (Week 2): Pt will stand to complete buttock hygiene with steadying assist OT Short Term Goal 2 - Progress (Week 2): Not met OT Short Term Goal 3 (Week 2): Pt will pull pants up during LB  dressing with mod A to stand at RW and steadying assist for dynamic balance OT Short Term Goal 3 - Progress (Week 2): Not met Week 3:  OT Short Term Goal 1 (Week 3): STG=LTG due to LOS  Skilled Therapeutic Interventions/Progress Updates:    Pt seen for OT ADL bathing/dressing session. Bathing and dressing completed from recliner level in simulation of home scenerio as pt planning to use lift chair for bathing/dressing tasks as pt's w/c does not fit into home bathroom.  Pt in supine upon arrival, agreeable to tx session. Throughout session, squat pivot transfers with guarding assist and increased time completed. Bathing/dressing from recliner using AE and lateral leans for LB dressing and pericare/ buttock hygiene. Upon completing lateral leans to pull underwear up, pt with complaints of dizziness. BP 109/66. Provided with cold washcloth and rest break, reporting feeling better following rest. RN made aware. Also noted pt with increased edema in B ankles, TED hose donned and RN aware. Increased assist required for pants and donning shoes due to fatigue/ time. Pt left seated in recliner at end of session, instructed to elevate LEs for edema management, all needs in reach.  Throughout session, educated regarding POC, OT goals, caregiver training, DME, and d/c planning.   Therapy Documentation Precautions:  Precautions Precautions: Back, Fall Required Braces or Orthoses: Spinal Brace Spinal Brace: Applied in sitting position, Lumbar corset Restrictions Weight Bearing Restrictions: No Pain:   No/ denies pain ADL: ADL ADL Comments: Please see functional navigator for ADL status  See Function  Navigator for Current Functional Status.   Therapy/Group: Individual Therapy  Lewis, Valena Ivanov C 12/20/2016, 7:08 AM

## 2016-12-20 NOTE — Progress Notes (Signed)
[facility]ILIT[facility]     PROGRESS NOTE  Subjective/Complaints:  bp drop in therapy this morning. Felt dizzy   ROS: pt denies nausea, vomiting, diarrhea, cough, shortness of breath or chest pain        Objective: Vital Signs: Blood pressure 109/66, pulse 77, temperature 98.9 F (37.2 C), temperature source Oral, resp. rate 18, height 6\' 7"  (2.007 m), weight 116.5 kg (256 lb 13.4 oz), SpO2 100 %. No results found. No results for input(s): WBC, HGB, HCT, PLT in the last 72 hours. No results for input(s): NA, K, CL, GLUCOSE, BUN, CREATININE, CALCIUM in the last 72 hours.  Invalid input(s): CO CBG (last 3)   Recent Labs  12/18/16 1215  GLUCAP 81    Wt Readings from Last 3 Encounters:  12/15/16 116.5 kg (256 lb 13.4 oz)  11/25/16 122.8 kg (270 lb 11.2 oz)  11/03/16 117 kg (258 lb)    Physical Exam:  BP 109/66   Pulse 77   Temp 98.9 F (37.2 C) (Oral)   Resp 18   Ht 6\' 7"  (2.007 m)   Wt 116.5 kg (256 lb 13.4 oz)   SpO2 100%   BMI 28.93 kg/m  Constitutional: NAD. Looks a little pale HENT: Normocephalic. Atraumatic Eyes: EOM are normal. No discharge.  Cardiovascular: RRR without JVD Respiratory:  CTA B GI: Soft. Bowel sounds are normal.  Musculoskeletal: 1+ LE edema.  Neurological: He is alert and oriented.  Sensation diminished to light touch RLE 1 to 1+/2 without change Motor: B/l UE 5/5 proximal to distal LLE: 4-/5 HF, KE, 4/5 ADP/PF--stable RLE: 2+/5 HF, 2+ to 3-/5 KE, 1-2/5 ADP/PF-  Skin: wound clean with steristrips Psychiatric: flat but appropriate   Assessment/Plan: 1. Functional deficits secondary to lumbar stenosis with radiculopathy status post decompression L-2-L5 which require 3+ hours per day of interdisciplinary therapy in a comprehensive inpatient rehab setting. Physiatrist is providing close team supervision and 24 hour management of active medical problems listed below. Physiatrist and rehab team continue to assess  barriers to discharge/monitor patient progress toward functional and medical goals.  Function:  Bathing Bathing position   Position: Other (comment) (Recliner)  Bathing parts Body parts bathed by patient: Right arm, Left arm, Chest, Abdomen, Front perineal area, Right lower leg, Left lower leg, Right upper leg, Left upper leg, Back, Buttocks Body parts bathed by helper: Buttocks  Bathing assist Assist Level: Set up   Set up : To obtain items  Upper Body Dressing/Undressing Upper body dressing   What is the patient wearing?: Pull over shirt/dress, Orthosis     Pull over shirt/dress - Perfomed by patient: Thread/unthread right sleeve, Thread/unthread left sleeve, Put head through opening, Pull shirt over trunk       Orthosis activity level: Performed by patient  Upper body assist Assist Level: Set up   Set up : To obtain clothing/put away  Lower Body Dressing/Undressing Lower body dressing   What is the patient wearing?: Underwear, Pants, Ted Hose, Shoes, AFO Underwear - Performed by patient: Thread/unthread right underwear leg, Thread/unthread left underwear leg Underwear - Performed by helper: Pull underwear up/down Pants- Performed by patient: Thread/unthread left pants leg, Thread/unthread right pants leg Pants- Performed by helper: Pull pants up/down Non-skid slipper socks- Performed by patient: Don/doff right sock, Don/doff left sock (sok aid) Non-skid slipper socks- Performed by helper: Don/doff right sock, Don/doff left sock     Shoes - Performed by patient: Don/doff left shoe Shoes - Performed by helper: Don/doff  right shoe, Don/doff left shoe   AFO - Performed by helper: Don/doff right AFO   TED Hose - Performed by helper: Don/doff right TED hose, Don/doff left TED hose  Lower body assist Assist for lower body dressing:  (min A)      Toileting Toileting Toileting activity did not occur: Refused Toileting steps completed by patient: Adjust clothing prior to  toileting, Performs perineal hygiene, Adjust clothing after toileting Toileting steps completed by helper: Adjust clothing prior to toileting, Performs perineal hygiene, Adjust clothing after toileting Toileting Assistive Devices: Grab bar or rail  Toileting assist Assist level: Touching or steadying assistance (Pt.75%)   Transfers Chair/bed transfer   Chair/bed transfer method: Squat pivot Chair/bed transfer assist level: Supervision or verbal cues Chair/bed transfer assistive device: Armrests Mechanical lift: Stedy   Locomotion Ambulation Ambulation activity did not occur: Safety/medical concerns   Max distance: 8' Assist level: Moderate assist (Pt 50 - 74%)   Wheelchair   Type: Manual Max wheelchair distance: 216ft  Assist Level: No help, No cues, assistive device, takes more than reasonable amount of time  Cognition Comprehension Comprehension assist level: Follows complex conversation/direction with no assist  Expression Expression assist level: Expresses complex ideas: With no assist  Social Interaction Social Interaction assist level: Interacts appropriately with others - No medications needed.  Problem Solving Problem solving assist level: Solves complex problems: Recognizes & self-corrects  Memory Memory assist level: Complete Independence: No helper    Medical Problem List and Plan: 1.  Decreased functional mobility secondary to lumbar stenosis with radiculopathy status post decompression L-2-L5. Back brace when out of bed  Continue CIR therapies--team conference  -pt has received hinged/locking knee brace. Trial of AFO too 2.  DVT Prophylaxis/Anticoagulation: SCDs.   Vascular study negative 3. Pain Management: Robaxin and oxycodone as needed  -appears controlled at present  -has mild dysesthesias RLE   -right ribcage pain improved   -continue ice, analgesics prn.   4. Mood: Provide emotional support 5. Neuropsych: This patient is capable of making decisions on his  own behalf. 6. Skin/Wound Care: Routine skin checks. Monitor back incision. 7. Fluids/Electrolytes/Nutrition: encourage PO  -BUN with improvement   -recheck BMET in am given low bp's  8. Hypertension. Aldactone 25 mg daily, HCTZ 25 mg daily, Avapro 300 mg daily, Norvasc 5 mg daily.   -bp's dropping again today  -hold hctz and norvasc---will not resume while here  -encourage fluids  9. Prediabetic. Hemoglobin A1c 5.3. Blood sugar checks discontinued 10. Constipation. Laxative assistance with results   LOS (Days) 16 A FACE TO FACE EVALUATION WAS PERFORMED  Eriyana Sweeten T 12/20/2016 8:55 AM

## 2016-12-20 NOTE — Plan of Care (Signed)
Problem: RH Balance Goal: LTG Patient will maintain dynamic standing with ADLs (OT) LTG:  Patient will maintain dynamic standing balance with assist during activities of daily living (OT)   Outcome: Not Applicable Date Met: 38/93/73 Goal d/c as pt's standing not functional at this time. - AL 1/29  Problem: RH Dressing Goal: LTG Patient will perform upper body dressing (OT) LTG Patient will perform upper body dressing with assist, with/without cues (OT).  Goal modified due to pt progress. AL 1/29 Goal: LTG Patient will perform lower body dressing w/assist (OT) LTG: Patient will perform lower body dressing with assist, with/without cues in positioning using equipment (OT)  Goal modified as not to require sit <> Stands, pt completing clothing management via lateral leans. AL 1/29

## 2016-12-21 ENCOUNTER — Inpatient Hospital Stay (HOSPITAL_COMMUNITY): Payer: Managed Care, Other (non HMO) | Admitting: Physical Therapy

## 2016-12-21 ENCOUNTER — Inpatient Hospital Stay (HOSPITAL_COMMUNITY): Payer: Managed Care, Other (non HMO) | Admitting: Occupational Therapy

## 2016-12-21 LAB — BASIC METABOLIC PANEL
ANION GAP: 7 (ref 5–15)
BUN: 22 mg/dL — ABNORMAL HIGH (ref 6–20)
CALCIUM: 9 mg/dL (ref 8.9–10.3)
CHLORIDE: 106 mmol/L (ref 101–111)
CO2: 27 mmol/L (ref 22–32)
Creatinine, Ser: 0.99 mg/dL (ref 0.61–1.24)
GFR calc non Af Amer: >60 mL/min (ref >60)
GLUCOSE: 118 mg/dL — AB (ref 65–99)
POTASSIUM: 3.8 mmol/L (ref 3.5–5.1)
Sodium: 140 mmol/L (ref 135–145)

## 2016-12-21 NOTE — Progress Notes (Signed)
Physical Therapy Note  Patient Details  Name: [name] MRN: [id number] Date of Birth: [birth year] Today's Date: 12/21/2016    Time: 1130-1200 30 minutes  1:1 No c/o pain. Pt performed functional transfers with supervision, increased time to level surfaces throughout session.  Supine AAROM bilat LE with focus on hip strengthening especially adduction and internal rotation. Pt with improving hip flexion and abduction strength, continues to require significant assistance for other motions.   Lynette Topete 12/21/2016, 2:10 PM

## 2016-12-21 NOTE — Progress Notes (Signed)
Physical Therapy Session Note  Patient Details  Name: [name] MRN: [id number] Date of Birth: July 26, 1961  Today's Date: 12/21/2016 PT Individual Time: 1435-1535 PT Individual Time Calculation (min): 60 min   Short Term Goals: Week 3:  PT Short Term Goal 1 (Week 3): =LTG due to estimated LOS, downgraded to S/minA overall w/c level  Skilled Therapeutic Interventions/Progress Updates: Pt received seated in w/c, denies pain and agreeable to treatment. W/c propulsion to gym x75' with BUE for strengthening and endurance. Session with focus on sit <>stand, weight bearing and stance control through RLE, and upright posture in stance. Require +3A for sit <>stand no AD and assist with progressing one knee onto table. Unable to weight bear through R knee on table for glute activation; activity adjusted for RLE on floor, LLE on table to facilitate weight shift to R and weight bearing through R knee. Poor standing tolerance d/t fatigue, pain and requires totalA to adjust LE position and safely sit in w/c when fatigued. Attempted 3 gait trials with +3A plus w/c follow; assist for RLE progression and stance, and upright posture. Remained seated in w/c at end of session, all needs in reach.      Therapy Documentation Precautions:  Precautions Precautions: Back, Fall Required Braces or Orthoses: Spinal Brace Spinal Brace: Applied in sitting position, Lumbar corset Restrictions Weight Bearing Restrictions: No  See Function Navigator for Current Functional Status.   Therapy/Group: Individual Therapy  Luberta Mutter 12/21/2016, 4:31 PM

## 2016-12-21 NOTE — Progress Notes (Signed)
[name] PHYSICAL MEDICINE & REHABILITATION     PROGRESS NOTE  Subjective/Complaints:  Felt better after yesterday morning's issues. Did take an emotional blow when he discovered that his mother passed away yesterday.   ROS: pt denies nausea, vomiting, diarrhea, cough, shortness of breath or chest pain         Objective: Vital Signs: Blood pressure 108/70, pulse 85, temperature 98.1 F (36.7 C), temperature source Oral, resp. rate 18, height 6\' 7"  (2.007 m), weight 116.5 kg (256 lb 13.4 oz), SpO2 99 %. No results found. No results for input(s): WBC, HGB, HCT, PLT in the last 72 hours.  Recent Labs  12/21/16 0334  NA 140  K 3.8  CL 106  GLUCOSE 118*  BUN 22*  CREATININE 0.99  CALCIUM 9.0   CBG (last 3)   Recent Labs  12/18/16 1215  GLUCAP 81    Wt Readings from Last 3 Encounters:  12/15/16 116.5 kg (256 lb 13.4 oz)  11/25/16 122.8 kg (270 lb 11.2 oz)  11/03/16 117 kg (258 lb)    Physical Exam:  BP 108/70 (BP Location: Left Arm)   Pulse 85   Temp 98.1 F (36.7 C) (Oral)   Resp 18   Ht 6\' 7"  (2.007 m)   Wt 116.5 kg (256 lb 13.4 oz)   SpO2 99%   BMI 28.93 kg/m  Constitutional: NAD. Looks a little pale HENT: Normocephalic. Atraumatic Eyes: EOM are normal. No discharge.  Cardiovascular: RRR without JVD Respiratory:  CTA B GI: Soft. Bowel sounds are normal.  Musculoskeletal: tr LE edema.  Neurological: He is alert and oriented.  Sensation diminished to light touch RLE 1 to 1+/2 without change Motor: B/l UE 5/5 proximal to distal LLE: 4-/5 HF, KE, 4/5 ADP/PF--no change RLE: 2+/5 HF, 2+ to 3-/5 KE, 1-2/5 ADP/PF-  Skin: wound clean with steristrips Psychiatric: flat but appropriate   Assessment/Plan: 1. Functional deficits secondary to lumbar stenosis with radiculopathy status post decompression L-2-L5 which require 3+ hours per day of interdisciplinary therapy in a comprehensive inpatient rehab setting. Physiatrist is providing close team  supervision and 24 hour management of active medical problems listed below. Physiatrist and rehab team continue to assess barriers to discharge/monitor patient progress toward functional and medical goals.  Function:  Bathing Bathing position   Position: Other (comment) (Recliner)  Bathing parts Body parts bathed by patient: Right arm, Left arm, Chest, Abdomen, Front perineal area, Right lower leg, Left lower leg, Right upper leg, Left upper leg, Back, Buttocks Body parts bathed by helper: Buttocks  Bathing assist Assist Level: Set up   Set up : To obtain items  Upper Body Dressing/Undressing Upper body dressing   What is the patient wearing?: Pull over shirt/dress, Orthosis     Pull over shirt/dress - Perfomed by patient: Thread/unthread right sleeve, Thread/unthread left sleeve, Put head through opening, Pull shirt over trunk       Orthosis activity level: Performed by patient  Upper body assist Assist Level: Set up   Set up : To obtain clothing/put away  Lower Body Dressing/Undressing Lower body dressing   What is the patient wearing?: Underwear, Pants, Ted Hose, Shoes, AFO Underwear - Performed by patient: Thread/unthread right underwear leg, Thread/unthread left underwear leg Underwear - Performed by helper: Pull underwear up/down Pants- Performed by patient: Thread/unthread left pants leg, Thread/unthread right pants leg Pants- Performed by helper: Pull pants up/down Non-skid slipper socks- Performed by patient: Don/doff right sock, Don/doff left sock (sok aid) Non-skid slipper socks-  Performed by helper: Don/doff right sock, Don/doff left sock     Shoes - Performed by patient: Don/doff left shoe Shoes - Performed by helper: Don/doff right shoe, Don/doff left shoe   AFO - Performed by helper: Don/doff right AFO   TED Hose - Performed by helper: Don/doff right TED hose, Don/doff left TED hose  Lower body assist Assist for lower body dressing:  (min A)       Toileting Toileting Toileting activity did not occur: Refused Toileting steps completed by patient: Adjust clothing prior to toileting, Performs perineal hygiene, Adjust clothing after toileting Toileting steps completed by helper: Adjust clothing prior to toileting, Performs perineal hygiene, Adjust clothing after toileting Toileting Assistive Devices: Grab bar or rail  Toileting assist Assist level: Touching or steadying assistance (Pt.75%)   Transfers Chair/bed transfer   Chair/bed transfer method: Squat pivot Chair/bed transfer assist level: Touching or steadying assistance (Pt > 75%) Chair/bed transfer assistive device: Armrests Mechanical lift: Stedy   Locomotion Ambulation Ambulation activity did not occur: Safety/medical concerns   Max distance: 8' Assist level: Moderate assist (Pt 50 - 74%)   Wheelchair   Type: Manual Max wheelchair distance: 246ft  Assist Level: No help, No cues, assistive device, takes more than reasonable amount of time  Cognition Comprehension Comprehension assist level: Follows complex conversation/direction with no assist  Expression Expression assist level: Expresses complex ideas: With no assist  Social Interaction Social Interaction assist level: Interacts appropriately with others - No medications needed.  Problem Solving Problem solving assist level: Solves complex problems: Recognizes & self-corrects  Memory Memory assist level: Complete Independence: No helper    Medical Problem List and Plan: 1.  Decreased functional mobility secondary to lumbar stenosis with radiculopathy status post decompression L-2-L5. Back brace when out of bed  Continue CIR therapies-   -pt has received hinged/locking knee brace.   -will review orthotics in team conference today 2.  DVT Prophylaxis/Anticoagulation: SCDs.   Vascular study negative 3. Pain Management: Robaxin and oxycodone as needed  -appears controlled at present  -has mild dysesthesias RLE    -having periodic trunk pain likely related to movements/therapy   -continue ice, analgesics prn.   4. Mood: Provide emotional support 5. Neuropsych: This patient is capable of making decisions on his own behalf. 6. Skin/Wound Care: Routine skin checks. Monitor back incision. 7. Fluids/Electrolytes/Nutrition: encourage PO  -BUN stable. I personally reviewed the patient's labs today.  8. Hypertension. Aldactone 25 mg daily, HCTZ 25 mg daily, Avapro 300 mg daily, Norvasc 5 mg daily.   - bp a little better this morning  -continue to hold hctz and norvasc---will not resume while here  -encourage fluids  9. Prediabetic. Hemoglobin A1c 5.3. Blood sugar checks discontinued 10. Constipation. Laxative assistance with results   LOS (Days) 17 A FACE TO FACE EVALUATION WAS PERFORMED  Tura Roller T 12/21/2016 8:38 AM

## 2016-12-21 NOTE — Progress Notes (Signed)
Occupational Therapy Session Note  Patient Details  Name: [name] MRN: [id number] Date of Birth: Apr 12, 1961  Today's Date: 12/21/2016 OT Individual Time: 0805-0900 OT Individual Time Calculation (min): 55 min    Short Term Goals: Week 3:  OT Short Term Goal 1 (Week 3): STG=LTG due to LOS  Skilled Therapeutic Interventions/Progress Updates:    Pt seen for OT session focusing on ADL re-training and sit <> Stands. Pt in [place] arrival, agreeable to tx session. With mobility pt voiced increased soreness in ack, RN aware and medication administered. VCs for maintaining back precautions during bed mobility. Mod A to come into sitting EOB. He bathed/dressed UB seated EOB, declining LB today. Pt desiring to complete clothing management via sit  <> stand today. He stood with mod A from highly elevated bed with multiple trials required before successful stand. Total A required for LB dressing, pt maintaining B UE support on RW during standing and with heavy reliance to pull up onto RW during sit> stand. Supervision squat pivot completed into w/c. Grooming completed mod I at sink. Pt then completed x2 sets of 10 w/c push ups, demonstrating good clearance for buttock during push ups. Rest break required btwn sets. Pt left seated in w/c at end of session, all needs in reach. Pt with questions regarding rental of lift chair at d/c.  Made him aware of local vendors and encouraged pt to ask CSW regarding other options.  Throughout session, educated regarding activity progression and recommendations for ADLs at d/c (i.e. Lateral leans and push ups for clothing management vs. Standing). Pt voiced understanding and agreement.   Therapy Documentation Precautions:  Precautions Precautions: Back, Fall Required Braces or Orthoses: Spinal Brace Spinal Brace: Applied in sitting position, Lumbar corset Restrictions Weight Bearing Restrictions: No ADL: ADL ADL Comments: Please see functional navigator  for ADL status  See Function Navigator for Current Functional Status.   Therapy/Group: Individual Therapy  Lewis, Rachit Grim C 12/21/2016, 6:42 AM

## 2016-12-21 NOTE — Progress Notes (Signed)
Physical Therapy Session Note  Patient Details  Name: [name] MRN: [id number] Date of Birth: 1961-06-15  Today's Date: 12/21/2016 PT Individual Time: 0950-1047 PT Individual Time Calculation (min): 57 min   Therapy Documentation Precautions:  Precautions Precautions: Back, Fall Required Braces or Orthoses: Spinal Brace Spinal Brace: Applied in sitting position, Lumbar corset Restrictions Weight Bearing Restrictions: No General:   Vital Signs: Therapy Vitals Temp: 98.1 F (36.7 C) Temp Source: Oral Pain: Patient denies any pain.    Patient propelled wheelchair to and from gym 250 feet mod I  Patient performed lateral transfer to and from mat and wheelchair close supervision. Verbal cues for wheelchair management and set-up and parts management.   Patient sat edge of mat independently  Patient positioned in high perch position edge of mat focusing on sit to and from stand transfer with use of UE. Focus of eccentric control during descent. Patient performed block practice sit to and from stand from high perch position for approximately 20 min with rest breaks in between. Focus on upright posture, even weight distribution, hip extension and terminal knee extension. Patient varied between min and mod assist   Patient performed sit to and from stand transfer in // bars with min and mod assist. Focus on terminal knee extension and standing tolerance. Block practice for approximately 15 min with rest breaks between each rep.   Patient returned to room at end of session with all needs met resting comfortably. Education provided throughout session on proper technique and sequence, importance of posture and controlled breathing.      See Function Navigator for Current Functional Status.   Therapy/Group: Individual Therapy  [name] 12/21/2016, 10:55 AM

## 2016-12-22 ENCOUNTER — Inpatient Hospital Stay (HOSPITAL_COMMUNITY): Payer: Managed Care, Other (non HMO) | Admitting: Physical Therapy

## 2016-12-22 ENCOUNTER — Inpatient Hospital Stay (HOSPITAL_COMMUNITY): Payer: Managed Care, Other (non HMO) | Admitting: Occupational Therapy

## 2016-12-22 NOTE — Patient Care Conference (Signed)
Inpatient RehabilitationTeam Conference and Plan of Care Update Date: 12/21/2016   Time: 2:05 PM    Patient Name: [name]      Medical Record Number: [id number]  Date of Birth: [birth year] Sex: Male         Room/Bed: 4M10C/4M10C-01 Payor Info: Payor: [employer] / Plan: Market researcher / Product Type: *No Product type* /    Admitting Diagnosis: Thoraic Myelopathy  Admit Date/Time:  12/04/2016  3:49 PM Admission Comments: No comment available   Primary Diagnosis:  Lumbar radiculopathy Principal Problem: Lumbar radiculopathy  Patient Active Problem List   Diagnosis Date Noted  . Prerenal azotemia 12/09/2016  . Lumbar radiculopathy 12/04/2016  . Surgery, elective   . Weakness of both legs   . Post-operative pain   . Benign essential HTN   . Prediabetes   . Constipation due to pain medication   . Back pain 12/01/2016  . Bradycardia 07/17/2014  . Syncope 07/15/2014    Expected Discharge Date: Expected Discharge Date: 12/28/16  Team Members Present: Physician leading conference: Dr. Alger Simons Social Worker Present: Lennart Pall, LCSW Nurse Present: Heather Roberts, RN PT Present: Canary Brim, Harriet Pho, PT OT Present: Napoleon Form, OT SLP Present: Weston Anna, SLP PPS Coordinator present : Daiva Nakayama, RN, CRRN     Current Status/Progress Goal Weekly Team Focus  Medical   persistent right HF/quad weakness. working on orthotics. bp's low again--regimen adjusted  improve functional mobility  bp control, orthotic rx, pain control   Bowel/Bladder   Continent of bowel and bladder; LBM 12/20/2016  Pt will maintain a regular bowel pattern  Monitor for LBM's over 3 days and provide pt w/ laxative/stool softener   Swallow/Nutrition/ Hydration             ADL's   Min A LB dressing using lateral leans; Supervision squat pivots  Supervision-min A overall  ADL re-training from w/c level; family training; d/c planning; activity tolerance   Mobility   S to minA bed mobility  and transfers, mod/maxA sit <>stand, gait x5' modA + w/c follow pre-functional  S transfers, minA bed mobility and car transfers, modA gait in controlled environment x10', family to bump w/c up/down stairs for home entry, gait in home goal d/c d/t slow progress  LE strengthening/NMR, sit <>stand, transfer training, pre-gait   Communication             Safety/Cognition/ Behavioral Observations            Pain   Pt has no c/o pain  <3  Assess for noverbal signs of pain q shift and PRN and treat   Skin   lumar incision OTA; most of steri strips are gone  Pt will be free of skin breakdown  monitor for changes in skin integrity    Rehab Goals Patient on target to meet rehab goals: Yes *See Care Plan and progress notes for long and short-term goals.  Barriers to Discharge: continue right lower ext weakness    Possible Resolutions to Barriers:  see prior.     Discharge Planning/Teaching Needs:  Plan home with wife and daughters who can provide any needed assistance.  Teaching to be scheduled with family.   Team Discussion:  BP issues again yesterday - MD addressing.  Pt learned of mother's death last night - declining support services here (i.e. Chaplain, psychologist).  Continue to work with the AFO.   Downgrading most goals to w/c level overall.  Ready to begin family ed - SW to  arrange.  Revisions to Treatment Plan:  Goals being downgraded to w/c level mobility.   Continued Need for Acute Rehabilitation Level of Care: The patient requires daily medical management by a physician with specialized training in physical medicine and rehabilitation for the following conditions: Daily direction of a multidisciplinary physical rehabilitation program to ensure safe treatment while eliciting the highest outcome that is of practical value to the patient.: Yes Daily medical management of patient stability for increased activity during participation in an intensive rehabilitation regime.: Yes Daily  analysis of laboratory values and/or radiology reports with any subsequent need for medication adjustment of medical intervention for : Neurological problems;Post surgical problems  Erinn Mendosa 12/22/2016, 8:50 AM

## 2016-12-22 NOTE — Progress Notes (Signed)
[place] PHYSICAL MEDICINE & REHABILITATION     PROGRESS NOTE  Subjective/Complaints:  No new complaints this morning. Made it through the day without problems yesterday. Denies dizziness  ROS: pt denies nausea, vomiting, diarrhea, cough, shortness of breath or chest pain          Objective: Vital Signs: Blood pressure 122/74, pulse 78, temperature 99.6 F (37.6 C), temperature source Oral, resp. rate 18, height 6\' 7"  (2.007 m), weight 116.5 kg (256 lb 13.4 oz), SpO2 100 %. No results found. No results for input(s): WBC, HGB, HCT, PLT in the last 72 hours.  Recent Labs  12/21/16 0334  NA 140  K 3.8  CL 106  GLUCOSE 118*  BUN 22*  CREATININE 0.99  CALCIUM 9.0   CBG (last 3)  No results for input(s): GLUCAP in the last 72 hours.  Wt Readings from Last 3 Encounters:  12/15/16 116.5 kg (256 lb 13.4 oz)  11/25/16 122.8 kg (270 lb 11.2 oz)  11/03/16 117 kg (258 lb)    Physical Exam:  BP 122/74 (BP Location: Left Arm)   Pulse 78   Temp 99.6 F (37.6 C) (Oral)   Resp 18   Ht 6\' 7"  (2.007 m)   Wt 116.5 kg (256 lb 13.4 oz)   SpO2 100%   BMI 28.93 kg/m  Constitutional: NAD HENT: Normocephalic. Atraumatic Eyes: EOM are normal. No discharge.  Cardiovascular: RRR Respiratory:  CTA B GI: Soft. Bowel sounds are normal.  Musculoskeletal: tr LE edema.  Neurological: He is alert and oriented.  Sensation diminished to light touch RLE 1 to 1+/2 without change Motor: B/l UE 5/5 proximal to distal LLE: 4-/5 HF, KE, 4/5 ADP/PF--stable RLE: 2+/5 HF, 2+ to 3-/5 KE, 1-2/5 ADP/PF-  Skin: wound clean with steristrips Psychiatric: remains flat but appropriate   Assessment/Plan: 1. Functional deficits secondary to lumbar stenosis with radiculopathy status post decompression L-2-L5 which require 3+ hours per day of interdisciplinary therapy in a comprehensive inpatient rehab setting. Physiatrist is providing close team supervision and 24 hour management of active medical  problems listed below. Physiatrist and rehab team continue to assess barriers to discharge/monitor patient progress toward functional and medical goals.  Function:  Bathing Bathing position   Position: Sitting EOB  Bathing parts Body parts bathed by patient: Right arm, Left arm, Chest, Abdomen Body parts bathed by helper: Buttocks  Bathing assist Assist Level: Set up   Set up : To obtain items  Upper Body Dressing/Undressing Upper body dressing   What is the patient wearing?: Pull over shirt/dress, Orthosis     Pull over shirt/dress - Perfomed by patient: Thread/unthread right sleeve, Thread/unthread left sleeve, Put head through opening, Pull shirt over trunk       Orthosis activity level: Performed by patient  Upper body assist Assist Level: Set up   Set up : To obtain clothing/put away  Lower Body Dressing/Undressing Lower body dressing   What is the patient wearing?: Underwear, Pants, Ted Hose, Shoes, AFO Underwear - Performed by patient: Thread/unthread right underwear leg, Thread/unthread left underwear leg Underwear - Performed by helper: Thread/unthread right underwear leg, Thread/unthread left underwear leg, Pull underwear up/down Pants- Performed by patient: Thread/unthread left pants leg, Thread/unthread right pants leg Pants- Performed by helper: Thread/unthread right pants leg, Thread/unthread left pants leg, Pull pants up/down Non-skid slipper socks- Performed by patient: Don/doff right sock, Don/doff left sock (sok aid) Non-skid slipper socks- Performed by helper: Don/doff right sock, Don/doff left sock     Shoes -  Performed by patient: Don/doff left shoe Shoes - Performed by helper: Don/doff right shoe, Don/doff left shoe   AFO - Performed by helper: Don/doff right AFO   TED Hose - Performed by helper: Don/doff right TED hose, Don/doff left TED hose  Lower body assist Assist for lower body dressing:  (min A)      Toileting Toileting Toileting activity  did not occur: Refused Toileting steps completed by patient: Adjust clothing prior to toileting, Performs perineal hygiene, Adjust clothing after toileting Toileting steps completed by helper: Adjust clothing prior to toileting, Performs perineal hygiene, Adjust clothing after toileting Toileting Assistive Devices: Grab bar or rail  Toileting assist Assist level: Touching or steadying assistance (Pt.75%)   Transfers Chair/bed transfer   Chair/bed transfer method: Squat pivot Chair/bed transfer assist level: Supervision or verbal cues Chair/bed transfer assistive device: Armrests Mechanical lift: Stedy   Locomotion Ambulation Ambulation activity did not occur: Safety/medical concerns   Max distance: 8' Assist level: Moderate assist (Pt 50 - 74%)   Wheelchair   Type: Manual Max wheelchair distance: 248ft  Assist Level: No help, No cues, assistive device, takes more than reasonable amount of time  Cognition Comprehension Comprehension assist level: Follows complex conversation/direction with no assist  Expression Expression assist level: Expresses complex ideas: With no assist  Social Interaction Social Interaction assist level: Interacts appropriately with others - No medications needed.  Problem Solving Problem solving assist level: Solves complex problems: Recognizes & self-corrects  Memory Memory assist level: Complete Independence: No helper    Medical Problem List and Plan: 1.  Decreased functional mobility secondary to lumbar stenosis with radiculopathy status post decompression L-2-L5. Back brace when out of bed  Continue CIR therapies-   -pt has received hinged/locking knee brace.   -goals changed to w/c level d/t difficulties with standing/balance   -d/w him that we can address gait goals as an outpt 2.  DVT Prophylaxis/Anticoagulation: SCDs.   Vascular study negative 3. Pain Management: Robaxin and oxycodone as needed  -appears controlled at present  -has mild  dysesthesias RLE   -having periodic trunk pain likely related to movements/therapy   -continue ice, analgesics prn.   4. Mood: Provide emotional support  -offered support if needed re: loss of mother 5. Neuropsych: This patient is capable of making decisions on his own behalf. 6. Skin/Wound Care: Routine skin checks. Monitor back incision. 7. Fluids/Electrolytes/Nutrition: encourage PO  -  8. Hypertension. Aldactone 25 mg daily, HCTZ 25 mg daily, Avapro 300 mg daily, Norvasc 5 mg daily.   - bp a little better this morning  -continue to hold hctz and norvasc---will not resume while here  -BMET stable  9. Prediabetic. Hemoglobin A1c 5.3. Blood sugar checks discontinued 10. Constipation. Laxative assistance with results   LOS (Days) 18 A FACE TO FACE EVALUATION WAS PERFORMED  [name] T 12/22/2016 8:34 AM

## 2016-12-22 NOTE — Progress Notes (Signed)
Occupational Therapy Session Note  Patient Details  Name: [name] MRN: [id number] Date of Birth: 1961-09-17  Today's Date: 12/22/2016 OT Individual Time: 1105-1200 and 1300-1400 OT Individual Time Calculation (min): 55 min and 60 min   Short Term Goals: Week 3:  OT Short Term Goal 1 (Week 3): STG=LTG due to LOS  Skilled Therapeutic Interventions/Progress Updates:    Session One: Pt seen for OT ADL bathing/dressing session. Pt sitting up in w.c upon arrival, voicing desire for shower task.  Min A squat pivot transfer completed into shower. Relying heavily on grab bars, pt able to push self up into standing position while therapist completed clothing management, same technique used for buttock hygiene. Remainder of bathing task completed with distant supervision fro seated position using LH sponge for LB. He dressed seated in w/c at sink, utilized reacher to thread pants. He opted to attempt stand for clothing management. He was able to stand at [facility] with mod A and pants pulled up total A. Pt aware this is not recommended method at d/c as stand not truly functional and is unsafe for family to try to attempt to stand with pt for this task.  Lotion donned total A, applying distal to proximal for edema management.  Pt left sitting at sink completing grooming tasks, all needs in reach.   Session Two: Pt seen for OT session focusing on functional transfers, standing tolerance, and neuro re-ed. Pt sitting up in w/c upon arrival, agreeable to tx session.   Self Care: Completed simulated toileting task with squat pivot to drop arm BSC with VCs for weight shift technique and assist to steady equipment. Lateral leans for clothing management.   Therapeutic Activity: In therapy gym, completed sit <> stand from 26" height mat  And mod A. Min A standing balance with B UE support on RW. Completed x4 trials, tolerating ~20 seconds each trial before requiring seated rest break.  Neuro Re-ed: Completed  static standing with L LE on block to facilitate weightbearing through R LE. Tolerated ~10-15 seconds each standing trial. Completed x3 trials.  Supine on mat, hip internal/external control. Completed on L with increased effort. Required mod A for limited external rotation and return to midline with assist for steadying at ankle.   Pt returned to w/c. VCs for head/hip relation during squat pivot. Returned to room and left sitting in w/c, all needs in reach.   Therapy Documentation Precautions:  Precautions Precautions: Back, Fall Required Braces or Orthoses: Spinal Brace Spinal Brace: Applied in sitting position, Lumbar corset Restrictions Weight Bearing Restrictions: No Pain:   No/ denies pain ADL: ADL ADL Comments: Please see functional navigator for ADL status  See Function Navigator for Current Functional Status.   Therapy/Group: Individual Therapy  Lewis, Devina Bezold C 12/22/2016, 7:05 AM

## 2016-12-22 NOTE — Progress Notes (Signed)
Physical Therapy Session Note  Patient Details  Name: [name] MRN: [id number] Date of Birth: Mar 14, 1961  Today's Date: 12/22/2016 PT Individual Time: 0905-0915 PT Individual Time Calculation (min): 10 min  and Today's Date: 12/22/2016 PT Group Time: 0915-1000 PT Group Time Calculation (min): 45 min   Therapy Documentation Precautions:  Precautions Precautions: Back, Fall Required Braces or Orthoses: Spinal Brace Spinal Brace: Applied in sitting position, Lumbar corset Restrictions Weight Bearing Restrictions: No  Patient denied any pain   Patient received supine in bed max assist for lower body dressing.  Bed mobility mod assist rolling to right and left  Min assist left sidelyng   Patient propelled wheelchair to and from gym 250 feet mod I   Group participation   Patient performed lateral transfer to and from mat block practice 5x and wheelchair close supervision. Verbal cues for wheelchair management and set-up and parts management.   Patient sat edge of mat independently  Wheelchair skills focusing propulsion, 90 degree, maintain straight trajectory, and drag turns all performed with close supervision.   Patient negotiationed 15 foot ramp in wheelchair min assist     Patient returned to room at end of session with all needs met resting comfortably. Education provided throughout session on proper technique and sequence, importance of posture and controlled breathing during transfers as well more energy efficient wheelchair propulsion to improve sequence and technique.     See Function Navigator for Current Functional Status.   Therapy/Group: Individual Therapy and Group Therapy  [name] 12/22/2016, 1:51 PM

## 2016-12-23 ENCOUNTER — Inpatient Hospital Stay (HOSPITAL_COMMUNITY): Payer: Managed Care, Other (non HMO) | Admitting: Physical Therapy

## 2016-12-23 ENCOUNTER — Inpatient Hospital Stay (HOSPITAL_COMMUNITY): Payer: Managed Care, Other (non HMO) | Admitting: Occupational Therapy

## 2016-12-23 NOTE — Progress Notes (Signed)
[place] PHYSICAL MEDICINE & REHABILITATION     PROGRESS NOTE  Subjective/Complaints:  Uneventful night. No complaints.   ROS: pt denies nausea, vomiting, diarrhea, cough, shortness of breath or chest pain          Objective: Vital Signs: Blood pressure 120/68, pulse 75, temperature 99 F (37.2 C), temperature source Oral, resp. rate 18, height 6\' 7"  (2.007 m), weight 117.5 kg (259 lb 0.7 oz), SpO2 100 %. No results found. No results for input(s): WBC, HGB, HCT, PLT in the last 72 hours.  Recent Labs  12/21/16 0334  NA 140  K 3.8  CL 106  GLUCOSE 118*  BUN 22*  CREATININE 0.99  CALCIUM 9.0   CBG (last 3)  No results for input(s): GLUCAP in the last 72 hours.  Wt Readings from Last 3 Encounters:  12/22/16 117.5 kg (259 lb 0.7 oz)  11/25/16 122.8 kg (270 lb 11.2 oz)  11/03/16 117 kg (258 lb)    Physical Exam:  BP 120/68 (BP Location: Left Arm)   Pulse 75   Temp 99 F (37.2 C) (Oral)   Resp 18   Ht 6\' 7"  (2.007 m)   Wt 117.5 kg (259 lb 0.7 oz)   SpO2 100%   BMI 29.18 kg/m  Constitutional: NAD HENT: Normocephalic. Atraumatic Eyes: EOM are normal. No discharge.  Cardiovascular: RRR Respiratory:  Clear bilaterally GI: Soft. Bowel sounds are normal.  Musculoskeletal: tr LE edema.  Neurological: He is alert and oriented.  Sensation diminished to light touch RLE 1 to 1+/2 without change Motor: B/l UE 5/5 proximal to distal LLE: 4-/5 HF, KE, 4/5 ADP/PF--stable RLE: 2+/5 HF, 2+ to 3-/5 KE, 1-2/5 ADP/PF  Skin: wound intact Psychiatric: remains flat but appropriate   Assessment/Plan: 1. Functional deficits secondary to lumbar stenosis with radiculopathy status post decompression L-2-L5 which require 3+ hours per day of interdisciplinary therapy in a comprehensive inpatient rehab setting. Physiatrist is providing close team supervision and 24 hour management of active medical problems listed below. Physiatrist and rehab team continue to assess barriers to  discharge/monitor patient progress toward functional and medical goals.  Function:  Bathing Bathing position   Position: Wheelchair/chair at sink  Bathing parts Body parts bathed by patient: Right arm, Left arm, Chest, Abdomen, Left upper leg, Right lower leg, Right upper leg, Left lower leg, Back, Front perineal area Body parts bathed by helper: Buttocks  Bathing assist Assist Level: Touching or steadying assistance(Pt > 75%)   Set up : To obtain items  Upper Body Dressing/Undressing Upper body dressing   What is the patient wearing?: Pull over shirt/dress, Orthosis     Pull over shirt/dress - Perfomed by patient: Thread/unthread right sleeve, Thread/unthread left sleeve, Put head through opening, Pull shirt over trunk       Orthosis activity level: Performed by patient  Upper body assist Assist Level: More than reasonable time   Set up : To obtain clothing/put away  Lower Body Dressing/Undressing Lower body dressing   What is the patient wearing?: Underwear, Pants, [employer], Shoes Underwear - Performed by patient: Thread/unthread right underwear leg, Thread/unthread left underwear leg Underwear - Performed by helper: Pull underwear up/down Pants- Performed by patient: Thread/unthread left pants leg, Thread/unthread right pants leg Pants- Performed by helper: Pull pants up/down Non-skid slipper socks- Performed by patient: Don/doff right sock, Don/doff left sock (sok aid) Non-skid slipper socks- Performed by helper: Don/doff right sock, Don/doff left sock     Shoes - Performed by patient: Don/doff right  shoe, Don/doff left shoe Shoes - Performed by helper: Don/doff right shoe, Don/doff left shoe   AFO - Performed by helper: Don/doff right AFO   TED Hose - Performed by helper: Don/doff right TED hose, Don/doff left TED hose  Lower body assist Assist for lower body dressing: Touching or steadying assistance (Pt > 75%)      Toileting Toileting Toileting activity did not  occur: Refused Toileting steps completed by patient: Adjust clothing prior to toileting, Performs perineal hygiene, Adjust clothing after toileting Toileting steps completed by helper: Adjust clothing prior to toileting, Performs perineal hygiene, Adjust clothing after toileting Toileting Assistive Devices: Grab bar or rail  Toileting assist Assist level: Touching or steadying assistance (Pt.75%)   Transfers Chair/bed transfer   Chair/bed transfer method: Squat pivot Chair/bed transfer assist level: Supervision or verbal cues Chair/bed transfer assistive device: Armrests Mechanical lift: Stedy   Locomotion Ambulation Ambulation activity did not occur: Safety/medical concerns   Max distance: 8' Assist level: Moderate assist (Pt 50 - 74%)   Wheelchair   Type: Manual Max wheelchair distance: 284ft  Assist Level: No help, No cues, assistive device, takes more than reasonable amount of time  Cognition Comprehension Comprehension assist level: Follows complex conversation/direction with no assist  Expression Expression assist level: Expresses complex ideas: With extra time/assistive device  Social Interaction Social Interaction assist level: Interacts appropriately with others - No medications needed.  Problem Solving Problem solving assist level: Solves complex problems: Recognizes & self-corrects  Memory Memory assist level: Complete Independence: No helper    Medical Problem List and Plan: 1.  Decreased functional mobility secondary to lumbar stenosis with radiculopathy status post decompression L-2-L5. Back brace when out of bed  Continue CIR therapies-   -pt has received hinged/locking knee brace.   -w/c goals 2.  DVT Prophylaxis/Anticoagulation: SCDs.   Vascular study negative 3. Pain Management: Robaxin and oxycodone as needed  -appears controlled at present  -has mild dysesthesias RLE   -having periodic trunk pain likely related to movements/therapy   -continue ice,  analgesics prn.   4. Mood: Provide emotional support  -pt dealing with loss of mother 5. Neuropsych: This patient is capable of making decisions on his own behalf. 6. Skin/Wound Care: Routine skin checks. Monitor back incision. 7. Fluids/Electrolytes/Nutrition: encourage PO  -  8. Hypertension. Aldactone 25 mg daily, HCTZ 25 mg daily, Avapro 300 mg daily, Norvasc 5 mg daily.   - bp has recovered  -continue to hold hctz and norvasc---will not resume while here  -BMET stable this week  9. Prediabetic. Hemoglobin A1c 5.3. Blood sugar checks discontinued 10. Constipation. Laxative assistance with results   LOS (Days) 19 A FACE TO FACE EVALUATION WAS PERFORMED  Rennae Ferraiolo T 12/23/2016 8:22 AM

## 2016-12-23 NOTE — Progress Notes (Signed)
Occupational Therapy Session Note  Patient Details  Name: [name] MRN: [id number] Date of Birth: 13-Mar-1961  Today's Date: 12/23/2016 OT Individual Time: 0700-0830 and 1400-1500 OT Individual Time Calculation (min): 90 min and 60 min   Short Term Goals: Week 3:  OT Short Term Goal 1 (Week 3): STG=LTG due to LOS  Skilled Therapeutic Interventions/Progress Updates:    Session One: Pt seen for OT ADL bathing/dressing session. Pt in supine upon arrival, agreeable to tx session. He transferred to EOB using leg lifter and bed rails with increased time. Increased time and effort required for squat pivot to w/c. Pt reports "warming up" in the morning and needing extra time/ effort for basic transfers. Discussed possible use of sliding board for times when he is not "warmed up" and more fatigued at d/c. Pt thinking sliding board is step backwards in regards to his progression, however, educated regarding importance of safety as well as for skin integrity on buttock when not able to fully clear seating surfaces during squat pivots.  He completed bathing/dressing seated in w/c at sink, using AE to assist with LB bathing/dressing. He intiitally completed w/c push up for therapist to assist with doffing underwear and completing buttock hygiene.  He stood with mod-max A from w/c and able to stand at [facility] with B UE support while therapist pulled underwear/ pants up. Pt donned shoes with use of reacher, LH shoe horn and elastic shoelaces.  He self propelled w/c to ADL apartment. Completed squat pivot transfer to low soft surface couch in simulation of home environment. Completed squat pivot to couch with guarding assist. Mod A required to transfer back to w/c with +2 used to steady equipment. Pt self propelled w/c back to room at end of session, left with all needs in reach, set-up with breakfast tray.   Session Two: Pt seen for OT session focusing on UE/ LE strengthening/ endurance. Pt sitting up in w/c upon  arrival, agreeable to tx session. Pt stated he has been sitting in chair since end of AM session, ensured therapist he has been doing pressure relief/ readjusting in chair. Completed squat pivot transfer with supervision to therapy mat, demonstrating improved clearance of buttock during transfer.  Seated EOM, pt completed strengthening circuit listed below. All exercises completed x3 sets of 10 reps with rest breaks provided btwn sets.   Crunches from inclined wedge with #3 ball chest press  Foot slides on pillow, R/L for hamstring strengthening Second circuit consisted of the following. Completed x3 sets of 10 reps with rest breaks btwn sets.   Block push ups  Sit<> stand from EOM to RW.- Required mod-max A, tolerating ~15 seconds in standing each trial.   Pt returned to room at end of session, left sitting up in w/c, all needs in reach.   Therapy Documentation Precautions:  Precautions Precautions: Back, Fall Required Braces or Orthoses: Spinal Brace Spinal Brace: Applied in sitting position, Lumbar corset Restrictions Weight Bearing Restrictions: No Pain:   No/denies pain ADL: ADL ADL Comments: Please see functional navigator for ADL status  See Function Navigator for Current Functional Status.   Therapy/Group: Individual Therapy  Lewis, Darrel Gloss C 12/23/2016, 6:34 AM

## 2016-12-23 NOTE — Plan of Care (Signed)
Problem: SCI BOWEL ELIMINATION Goal: RH STG SCI MANAGE BOWEL WITH MEDICATION WITH ASSISTANCE STG SCI Manage bowel with medication with assistance. Mod I  Outcome: Progressing Pt had bowel movement today, and goal is to continue to have healthy bowel patterns.

## 2016-12-23 NOTE — Progress Notes (Signed)
Social Work [name], male   DOB: [birth year], 56 y.o.   MRN: [id number]   Have reviewed team conference with pt and wife with both aware team continues to plan towards 2/6 discharge.  Wife and daughter to be here tomorrow afternoon for family education with therapies.  Provided pt with information on rental ramp company and PT to work with family to train how to bump up steps with pt in w/c.  I will follow up with pt and family tomorrow as well.  Sharon Stapel, LCSW

## 2016-12-23 NOTE — Progress Notes (Signed)
Physical Therapy Session Note  Patient Details  Name: [name] MRN: [id number] Date of Birth: December 13, 1960  Today's Date: 12/23/2016 PT Individual Time: [id number] PT Individual Time Calculation (min): 53 min   Short Term Goals:Week 3:  PT Short Term Goal 1 (Week 3): =LTG due to estimated LOS, downgraded to S/minA overall w/c level  Skilled Therapeutic Interventions/Progress Updates:  Pt received in w/c & agreeable to tx, denying c/o pain. Pt propelled w/c throughout unit (room<>dayroom) with BUE & supervision with task focusing on endurance training. Pt transferred onto tilt table via Stedy lift and required mod assist for supine<>sitting. While upright on tilt table pt able to elicit muscle contraction in BLE hamstrings & quads when attempting mini-squats; pt on tilt table ~10 minutes. Pt utilized cybex kinetron from w/c level with task focusing on BLE strengthening. Pt required assistance from therapist to prevent RLE abduction. At end of session pt left sitting in w/c in room with all needs within reach.      Therapy Documentation Precautions:  Precautions Precautions: Back, Fall Required Braces or Orthoses: Spinal Brace Spinal Brace: Applied in sitting position, Lumbar corset Restrictions Weight Bearing Restrictions: No  See Function Navigator for Current Functional Status.   Therapy/Group: Individual Therapy  Waunita Schooner 12/23/2016, 5:17 PM

## 2016-12-23 NOTE — Plan of Care (Signed)
Problem: RH PAIN MANAGEMENT Goal: RH STG PAIN MANAGED AT OR BELOW [name] PAIN GOAL Less than 4 Outcome: Progressing No c/o pain     

## 2016-12-24 ENCOUNTER — Inpatient Hospital Stay (HOSPITAL_COMMUNITY): Payer: Managed Care, Other (non HMO) | Admitting: Physical Therapy

## 2016-12-24 ENCOUNTER — Inpatient Hospital Stay (HOSPITAL_COMMUNITY): Payer: Managed Care, Other (non HMO) | Admitting: Occupational Therapy

## 2016-12-24 ENCOUNTER — Ambulatory Visit (HOSPITAL_COMMUNITY): Payer: Managed Care, Other (non HMO) | Admitting: Physical Therapy

## 2016-12-24 NOTE — Progress Notes (Signed)
PHYSICAL MEDICINE & REHABILITATION     PROGRESS NOTE  Subjective/Complaints:  No new complaints. Feels that right leg might be getting a little stronger.pain controlled  ROS: pt denies nausea, vomiting, diarrhea, cough, shortness of breath or chest pain           Objective: Vital Signs: Blood pressure 114/73, pulse 66, temperature 98.7 F (37.1 C), temperature source Oral, resp. rate 17, height 6\' 7"  (2.007 m), weight 117.5 kg (259 lb 0.7 oz), SpO2 100 %. No results found. No results for input(s): WBC, HGB, HCT, PLT in the last 72 hours. No results for input(s): NA, K, CL, GLUCOSE, BUN, CREATININE, CALCIUM in the last 72 hours.  Invalid input(s): CO CBG (last 3)  No results for input(s): GLUCAP in the last 72 hours.  Wt Readings from Last 3 Encounters:  12/22/16 117.5 kg (259 lb 0.7 oz)  11/25/16 122.8 kg (270 lb 11.2 oz)  11/03/16 117 kg (258 lb)    Physical Exam:  BP 114/73 (BP Location: Left Arm)   Pulse 66   Temp 98.7 F (37.1 C) (Oral)   Resp 17   Ht 6\' 7"  (2.007 m)   Wt 117.5 kg (259 lb 0.7 oz)   SpO2 100%   BMI 29.18 kg/m  Constitutional: NAD HENT: Normocephalic. Atraumatic Eyes: EOM are normal. No discharge.  Cardiovascular: RRR Respiratory:  CTA B. No wheezes GI: Soft. Bowel sounds are normal.  Musculoskeletal: tr LE edema.  Neurological: He is alert and oriented.  Sensation diminished to light touch RLE 1 to 1+/2 without change Motor: B/l UE 5/5 proximal to distal LLE: 4-/5 HF, KE, 4/5 ADP/PF--stable RLE: 2+/5 HF, 2+ to 3-/5 KE, 1-2/5 ADP/PF (may be slightly improved this morning, but motor exam similar to prior)  Skin: wound intact Psychiatric: remains flat but appropriate   Assessment/Plan: 1. Functional deficits secondary to lumbar stenosis with radiculopathy status post decompression L-2-L5 which require 3+ hours per day of interdisciplinary therapy in a comprehensive inpatient rehab setting. Physiatrist is providing close team  supervision and 24 hour management of active medical problems listed below. Physiatrist and rehab team continue to assess barriers to discharge/monitor patient progress toward functional and medical goals.  Function:  Bathing Bathing position   Position: Wheelchair/chair at sink  Bathing parts Body parts bathed by patient: Right arm, Left arm, Chest, Abdomen, Left upper leg, Right lower leg, Right upper leg, Left lower leg, Back, Front perineal area Body parts bathed by helper: Buttocks  Bathing assist Assist Level: Touching or steadying assistance(Pt > 75%)   Set up : To obtain items  Upper Body Dressing/Undressing Upper body dressing   What is the patient wearing?: Pull over shirt/dress, Orthosis     Pull over shirt/dress - Perfomed by patient: Thread/unthread right sleeve, Thread/unthread left sleeve, Put head through opening, Pull shirt over trunk       Orthosis activity level: Performed by patient  Upper body assist Assist Level: More than reasonable time   Set up : To obtain clothing/put away  Lower Body Dressing/Undressing Lower body dressing   What is the patient wearing?: Underwear, Pants, [employer], Shoes Underwear - Performed by patient: Thread/unthread right underwear leg, Thread/unthread left underwear leg Underwear - Performed by helper: Pull underwear up/down Pants- Performed by patient: Thread/unthread left pants leg, Thread/unthread right pants leg Pants- Performed by helper: Pull pants up/down Non-skid slipper socks- Performed by patient: Don/doff right sock, Don/doff left sock (sok aid) Non-skid slipper socks- Performed by helper: Don/doff right  sock, Don/doff left sock     Shoes - Performed by patient: Don/doff right shoe, Don/doff left shoe Shoes - Performed by helper: Don/doff right shoe, Don/doff left shoe   AFO - Performed by helper: Don/doff right AFO   TED Hose - Performed by helper: Don/doff right TED hose, Don/doff left TED hose  Lower body assist  Assist for lower body dressing: Touching or steadying assistance (Pt > 75%)      Toileting Toileting Toileting activity did not occur: Refused Toileting steps completed by patient: Adjust clothing prior to toileting, Performs perineal hygiene, Adjust clothing after toileting Toileting steps completed by helper: Adjust clothing prior to toileting, Performs perineal hygiene, Adjust clothing after toileting Toileting Assistive Devices: Grab bar or rail  Toileting assist Assist level: Touching or steadying assistance (Pt.75%)   Transfers Chair/bed transfer   Chair/bed transfer method: Squat pivot Chair/bed transfer assist level: Supervision or verbal cues Chair/bed transfer assistive device: Armrests Mechanical lift: Stedy   Locomotion Ambulation Ambulation activity did not occur: Safety/medical concerns   Max distance: 8' Assist level: Moderate assist (Pt 50 - 74%)   Wheelchair   Type: Manual Max wheelchair distance: 150 ft Assist Level: Supervision or verbal cues  Cognition Comprehension Comprehension assist level: Follows complex conversation/direction with no assist  Expression Expression assist level: Expresses complex ideas: With extra time/assistive device  Social Interaction Social Interaction assist level: Interacts appropriately with others - No medications needed.  Problem Solving Problem solving assist level: Solves complex problems: Recognizes & self-corrects  Memory Memory assist level: Complete Independence: No helper    Medical Problem List and Plan: 1.  Decreased functional mobility secondary to lumbar stenosis with radiculopathy status post decompression L-2-L5. Back brace when out of bed  Continue CIR therapies-   - hinged/locking knee brace for standing/walking.   -w/c goals 2.  DVT Prophylaxis/Anticoagulation: SCDs.   Vascular study negative 3. Pain Management: Robaxin and oxycodone as needed  -appears controlled at present  -minimal dysesthesias RLE    -having periodic trunk pain likely related to movements/therapy   -continue ice, analgesics prn.   4. Mood: Provide emotional support  -pt's mother recently passed 5. Neuropsych: This patient is capable of making decisions on his own behalf. 6. Skin/Wound Care: Routine skin checks. Monitor back incision. 7. Fluids/Electrolytes/Nutrition:    -PO intake good 8. Hypertension. Aldactone 25 mg daily, HCTZ 25 mg daily, Avapro 300 mg daily, Norvasc 5 mg daily.   - bp has recovered  -continue to hold hctz and norvasc---will not resume while here    9. Prediabetic. Hemoglobin A1c 5.3. Blood sugar checks discontinued 10. Constipation. Laxative assistance with results   LOS (Days) 20 A FACE TO FACE EVALUATION WAS PERFORMED  Claritza July T 12/24/2016 9:17 AM

## 2016-12-24 NOTE — Progress Notes (Signed)
Physical Therapy Session Note  Patient Details  Name: [name] MRN: [id number] Date of Birth: 05-01-1961  Today's Date: 12/24/2016 PT Individual Time: 1300-1400 PT Individual Time Calculation (min): 60 min   Short Term Goals: Week 3:  PT Short Term Goal 1 (Week 3): =LTG due to estimated LOS, downgraded to S/minA overall w/c level  Skilled Therapeutic Interventions/Progress Updates: Pt seen for 1:1 session with wife present for focus on family education prior to d/c home at S w/c level. Pt propelled w/c throughout session with modI and increased time. Performed car transfer squat pivot with wife providing S; educated regarding stabilization of w/c, and transport of chair. Wife demonstrates bump up/down one step to simulate home environment with one step up to sidewalk before ramp begins. Daughter not present for session to assist, however pt's wife and therapist performed bump up/down x2 trials with +2 due to pt size, one person behind and one person in front to guide casters. Provided handout with instruction for bumping up/down stairs to A with recall after d/c. Discussed with pt importance of being able to direct care for stair management. Pt performed sit <>stand in parallel bars and forward/backward walking x5 ' with min guard. Improving LE weight bearing, however still note significant buckling in RLE during stance. Squat pivot transfer w/c >mat with S. Sit >stand with modA from mat height set equal to w/c seat height. Performed marching in place x10-12 steps x2 trials with min guard and facilitation at R knee for stance control. Discussed with wife and pt slow progress with LEs, and recommending pt remain w/c level when d/c home but to continue working towards LE strength and ambulating with HHPT. Pt and wife with no further questions/concerns regarding d/c home at this time. Remained seated on edge of mat at end of session for handoff to OT for next session.      Therapy  Documentation Precautions:  Precautions Precautions: Back, Fall Required Braces or Orthoses: Spinal Brace Spinal Brace: Applied in sitting position, Lumbar corset Restrictions Weight Bearing Restrictions: No Pain: Pain Assessment Pain Assessment: No/denies pain Pain Score: 0-No pain   See Function Navigator for Current Functional Status.   Therapy/Group: Individual Therapy  Luberta Mutter 12/24/2016, 2:01 PM

## 2016-12-24 NOTE — Progress Notes (Signed)
Occupational Therapy Session Note  Patient Details  Name: STELLAN FERMAN MRN: [id number] Date of Birth: Jan 20, 1961  Today's Date: 12/24/2016 OT Individual Time: 1105-1205 and 1400- 1500 OT Individual Time Calculation (min): 60 min and 60 min   Short Term Goals: Week 3:  OT Short Term Goal 1 (Week 3): STG=LTG due to LOS  Skilled Therapeutic Interventions/Progress Updates:    Session One: Pt seen for OT ADL bathing/dressing session. Pt in supine upon arrival, agreeable to tx session. Throughout session, functional transfers completed via squat pivot with assist to stable equipment. He bathed seated in w/c at sink. Completed lateral leans into chair placed beside w/c for buttock hygiene to be completed. He used reacher to assist with threading pants, completed lateral leans onto EOB and into chair, completing multiple leans to each side to get pants up all the way with min steadying assist and assist to steady equipment. Pt left seated in w/c at end of session, all needs in reach and set-up with lunch tray.  Discussed during session areas to cover during family ed to take place in afternoon session. Pt with no concerns regarding going home and just wants to ensure he is doing everything he is suppose to in order to heal as well as maintaining back pre-cautions. Also discussed DME, followed up with CSW to ensure order for drop arm BSC.   Session Two: Pt seen for OT session focusing on family education with pt's wife. Pt received in therapy gym with hand off from PT. Supervision squat pivot completed back to w/c and he self propelled w/c back to room for OT education session.  Education completed and demonstration provided regarding the following:   Squat pivot transfers to drop arm BSC  Lateral leans for clothing management/ buttock hygiene.  W/c push ups in order for caregivers to assist with clothing management/ buttock hygiene  W/c part management  What to do in case of a fall  Continuum of  care  OT goals Pt and pt's wife voice feeling comfortable and confident for planned d/c home next week.  Stressed importance of w/c level ADLs at this time, only standing with therapy until instructed otherwise, both voiced understanding.  Pt completed x3 sit <> stands with mod A to RW, tolerating ~30-45 seconds of static standing with B UE support. Extended seated rest breaks required btwn trials.  Pt left seated in w/c at end of session, all needs in reach.   Therapy Documentation Precautions:  Precautions Precautions: Back, Fall Required Braces or Orthoses: Spinal Brace Spinal Brace: Applied in sitting position, Lumbar corset Restrictions Weight Bearing Restrictions: No Pain:   No/ denies pain ADL: ADL ADL Comments: Please see functional navigator for ADL status  See Function Navigator for Current Functional Status.   Therapy/Group: Individual Therapy  Lewis, Kaori Jumper C 12/24/2016, 7:11 AM

## 2016-12-25 ENCOUNTER — Inpatient Hospital Stay (HOSPITAL_COMMUNITY): Payer: Managed Care, Other (non HMO) | Admitting: Occupational Therapy

## 2016-12-25 NOTE — Progress Notes (Signed)
Occupational Therapy Session Note  Patient Details  Name: [name] MRN: [id number] Date of Birth: 10/30/1961  Today's Date: 12/25/2016 OT Individual Time: [id number] OT Individual Time Calculation (min): 60 min    Short Term Goals: Week 3:  OT Short Term Goal 1 (Week 3): STG=LTG due to LOS  Skilled Therapeutic Interventions/Progress Updates:    Pt seen for OT ADL bathing/dressing session. Pt in supine upon arrival, agreeable to tx session and bathing at shower level. Use of leg lifter to transfer to EOB and with increased time completed squat pivot to w/c. Min A squat pivot completed to shower chair with heavy reliance on grab bars. He bathed seated on shower chair using LH sponge. He stood with heavy reliance on grab bars for buttock hygiene to be completed total A. Increased assist required for squat pivot out of shower. He dressed seated in w/c using reacher to assist with threading LEs. He stood at [employer] with mod A to stand and total A to pull pants up. Sock aid used to don B socks with min A to reposition. Pt left seated in w/c at end of session, all needs in reach, seated at sink completed grooming tasks.  Pt voiced feeling as if family ed went well yesterday and wife feeling comfortable with planned d/c home next week.   Therapy Documentation Precautions:  Precautions Precautions: Back, Fall Required Braces or Orthoses: Spinal Brace Spinal Brace: Applied in sitting position, Lumbar corset Restrictions Weight Bearing Restrictions: No Pain:   No/ denies pain ADL: ADL ADL Comments: Please see functional navigator for ADL status  See Function Navigator for Current Functional Status.   Therapy/Group: Individual Therapy  Lewis, Jaylie Neaves C 12/25/2016, 6:42 AM

## 2016-12-25 NOTE — Progress Notes (Signed)
[place] PHYSICAL MEDICINE & REHABILITATION     PROGRESS NOTE  Subjective/Complaints:  Discussed foot and ankle swelling , none reported pre op, no ankle foot trauma or pain, denies excess Na intake  ROS: pt denies nausea, vomiting, diarrhea, cough, shortness of breath or chest pain    Objective: Vital Signs: Blood pressure (!) 142/88, pulse 68, temperature 98.9 F (37.2 C), temperature source Oral, resp. rate 18, height 6\' 7"  (2.007 m), weight 117.5 kg (259 lb 0.7 oz), SpO2 100 %. No results found. No results for input(s): WBC, HGB, HCT, PLT in the last 72 hours. No results for input(s): NA, K, CL, GLUCOSE, BUN, CREATININE, CALCIUM in the last 72 hours.  Invalid input(s): CO CBG (last 3)  No results for input(s): GLUCAP in the last 72 hours.  Wt Readings from Last 3 Encounters:  12/22/16 117.5 kg (259 lb 0.7 oz)  11/25/16 122.8 kg (270 lb 11.2 oz)  11/03/16 117 kg (258 lb)    Physical Exam:  BP (!) 142/88 (BP Location: Left Arm)   Pulse 68   Temp 98.9 F (37.2 C) (Oral)   Resp 18   Ht 6\' 7"  (2.007 m)   Wt 117.5 kg (259 lb 0.7 oz)   SpO2 100%   BMI 29.18 kg/m  Constitutional: NAD HENT: Normocephalic. Atraumatic Eyes: EOM are normal. No discharge.  Cardiovascular: RRR Respiratory:  CTA B. No wheezes GI: Soft. Bowel sounds are normal.  Musculoskeletal: tr LE edema.  Neurological: He is alert and oriented.  Sensation diminished to light touch RLE 1 to 1+/2 without change Motor: B/l UE 5/5 proximal to distal LLE: 4-/5 HF, KE, 4/5 ADP/PF--stable RLE: 2+/5 HF, 2+ to 3-/5 KE, 2/5 ADP/PF (may be slightly improved this morning, but motor exam similar to prior)  Skin: wound intact Psychiatric: remains flat but appropriate   Assessment/Plan: 1. Functional deficits secondary to lumbar stenosis with radiculopathy status post decompression L-2-L5 which require 3+ hours per day of interdisciplinary therapy in a comprehensive inpatient rehab setting. Physiatrist is  providing close team supervision and 24 hour management of active medical problems listed below. Physiatrist and rehab team continue to assess barriers to discharge/monitor patient progress toward functional and medical goals.  Function:  Bathing Bathing position   Position: Wheelchair/chair at sink  Bathing parts Body parts bathed by patient: Right arm, Left arm, Chest, Abdomen, Left upper leg, Right lower leg, Right upper leg, Left lower leg, Back, Front perineal area, Buttocks Body parts bathed by helper: Buttocks  Bathing assist Assist Level: Set up   Set up : To obtain items  Upper Body Dressing/Undressing Upper body dressing   What is the patient wearing?: Pull over shirt/dress, Orthosis     Pull over shirt/dress - Perfomed by patient: Thread/unthread right sleeve, Thread/unthread left sleeve, Put head through opening, Pull shirt over trunk       Orthosis activity level: Performed by patient  Upper body assist Assist Level: More than reasonable time   Set up : To obtain clothing/put away  Lower Body Dressing/Undressing Lower body dressing   What is the patient wearing?: Underwear, Pants, [employer], Shoes Underwear - Performed by patient: Thread/unthread right underwear leg, Thread/unthread left underwear leg, Pull underwear up/down Underwear - Performed by helper: Pull underwear up/down Pants- Performed by patient: Thread/unthread left pants leg, Thread/unthread right pants leg, Pull pants up/down Pants- Performed by helper: Pull pants up/down Non-skid slipper socks- Performed by patient: Don/doff right sock, Don/doff left sock (sok aid) Non-skid slipper socks- Performed  by helper: Don/doff right sock, Don/doff left sock     Shoes - Performed by patient: Don/doff right shoe, Don/doff left shoe Shoes - Performed by helper: Don/doff right shoe, Don/doff left shoe   AFO - Performed by helper: Don/doff right AFO   TED Hose - Performed by helper: Don/doff right TED hose,  Don/doff left TED hose  Lower body assist Assist for lower body dressing: Touching or steadying assistance (Pt > 75%)      Toileting Toileting Toileting activity did not occur: Refused Toileting steps completed by patient: Adjust clothing prior to toileting, Performs perineal hygiene, Adjust clothing after toileting Toileting steps completed by helper: Adjust clothing prior to toileting, Performs perineal hygiene, Adjust clothing after toileting Toileting Assistive Devices: Grab bar or rail  Toileting assist Assist level: Touching or steadying assistance (Pt.75%)   Transfers Chair/bed transfer   Chair/bed transfer method: Squat pivot Chair/bed transfer assist level: Supervision or verbal cues Chair/bed transfer assistive device: Armrests Mechanical lift: Stedy   Locomotion Ambulation Ambulation activity did not occur: Safety/medical concerns   Max distance: 5 Assist level: Touching or steadying assistance (Pt > 75%)   Wheelchair   Type: Manual Max wheelchair distance: 150 ft Assist Level: Supervision or verbal cues  Cognition Comprehension Comprehension assist level: Follows complex conversation/direction with no assist  Expression Expression assist level: Expresses complex ideas: With extra time/assistive device  Social Interaction Social Interaction assist level: Interacts appropriately with others - No medications needed.  Problem Solving Problem solving assist level: Solves complex problems: Recognizes & self-corrects  Memory Memory assist level: Complete Independence: No helper    Medical Problem List and Plan: 1.  Decreased functional mobility secondary to lumbar stenosis with radiculopathy status post decompression L-2-L5. Back brace when out of bed  Continue CIR PT, OT2.  DVT Prophylaxis/Anticoagulation: SCDs.   Vascular study negative 3. Pain Management: Robaxin and oxycodone as needed  -appears controlled at present  -minimal dysesthesias RLE   -having periodic  trunk pain likely related to movements/therapy   -continue ice, analgesics prn.   4. Mood: Provide emotional support  -pt's mother recently passed 5. Neuropsych: This patient is capable of making decisions on his own behalf. 6. Skin/Wound Care: Routine skin checks. Monitor back incision. 7. Fluids/Electrolytes/Nutrition:    -PO intake good 8. Hypertension. Aldactone 25 mg daily, HCTZ 25 mg daily, Avapro 300 mg daily, Norvasc 5 mg daily.   - bp has recovered  -continue to hold hctz and norvasc---will not resume while here    Vitals:   12/24/16 1500 12/25/16 0459  BP: (!) 141/83 (!) 142/88  Pulse: 64 68  Resp: 17 18  Temp: 98 F (36.7 C) 98.9 F (37.2 C)   9. Prediabetic. Hemoglobin A1c 5.3. Blood sugar checks discontinued 10. Constipation. Laxative assistance with results 11. LE swelling may be related to D/ced diuretic  LOS (Days) 21 A FACE TO FACE EVALUATION WAS PERFORMED  KIRSTEINS,ANDREW E 12/25/2016 7:00 AM

## 2016-12-26 ENCOUNTER — Inpatient Hospital Stay (HOSPITAL_COMMUNITY): Payer: Managed Care, Other (non HMO) | Admitting: Physical Therapy

## 2016-12-26 NOTE — Progress Notes (Signed)
PHYSICAL MEDICINE & REHABILITATION     PROGRESS NOTE  Subjective/Complaints:   No issues overnite, planning to watch game  ROS: pt denies nausea, vomiting, diarrhea, cough, shortness of breath or chest pain    Objective: Vital Signs: Blood pressure (!) 146/86, pulse 62, temperature 98.8 F (37.1 C), temperature source Oral, resp. rate 18, height 6\' 7"  (2.007 m), weight 117.5 kg (259 lb 0.7 oz), SpO2 100 %. No results found. No results for input(s): WBC, HGB, HCT, PLT in the last 72 hours. No results for input(s): NA, K, CL, GLUCOSE, BUN, CREATININE, CALCIUM in the last 72 hours.  Invalid input(s): CO CBG (last 3)  No results for input(s): GLUCAP in the last 72 hours.  Wt Readings from Last 3 Encounters:  12/22/16 117.5 kg (259 lb 0.7 oz)  11/25/16 122.8 kg (270 lb 11.2 oz)  11/03/16 117 kg (258 lb)    Physical Exam:  BP (!) 146/86 (BP Location: Left Arm)   Pulse 62   Temp 98.8 F (37.1 C) (Oral)   Resp 18   Ht 6\' 7"  (2.007 m)   Wt 117.5 kg (259 lb 0.7 oz)   SpO2 100%   BMI 29.18 kg/m  Constitutional: NAD HENT: Normocephalic. Atraumatic Eyes: EOM are normal. No discharge.  Cardiovascular: RRR Respiratory:  CTA B. No wheezes GI: Soft. Bowel sounds are normal.  Musculoskeletal: tr LE edema.  Neurological: He is alert and oriented.  Sensation diminished to light touch RLE 1 to 1+/2 without change Motor: B/l UE 5/5 proximal to distal LLE: 4-/5 HF, KE, 4/5 ADP/PF--stable RLE: 2+/5 HF, 2+ to 3-/5 KE, 2/5 ADP/PF (may be slightly improved this morning, but motor exam similar to prior)  Skin: wound intact Psychiatric: remains flat but appropriate   Assessment/Plan: 1. Functional deficits secondary to lumbar stenosis with radiculopathy status post decompression L-2-L5 which require 3+ hours per day of interdisciplinary therapy in a comprehensive inpatient rehab setting. Physiatrist is providing close team supervision and 24 hour management of active medical  problems listed below. Physiatrist and rehab team continue to assess barriers to discharge/monitor patient progress toward functional and medical goals.  Function:  Bathing Bathing position   Position: Shower  Bathing parts Body parts bathed by patient: Right arm, Left arm, Chest, Abdomen, Left upper leg, Right lower leg, Right upper leg, Left lower leg, Back, Front perineal area Body parts bathed by helper: Buttocks  Bathing assist Assist Level: Touching or steadying assistance(Pt > 75%)   Set up : To obtain items  Upper Body Dressing/Undressing Upper body dressing   What is the patient wearing?: Pull over shirt/dress     Pull over shirt/dress - Perfomed by patient: Thread/unthread right sleeve, Thread/unthread left sleeve, Put head through opening, Pull shirt over trunk       Orthosis activity level: Performed by patient  Upper body assist Assist Level: Set up   Set up : To obtain clothing/put away  Lower Body Dressing/Undressing Lower body dressing   What is the patient wearing?: Underwear, Pants, [employer], Shoes Underwear - Performed by patient: Thread/unthread right underwear leg, Thread/unthread left underwear leg Underwear - Performed by helper: Pull underwear up/down Pants- Performed by patient: Thread/unthread left pants leg, Thread/unthread right pants leg Pants- Performed by helper: Pull pants up/down Non-skid slipper socks- Performed by patient: Don/doff right sock, Don/doff left sock (Sock aid) Non-skid slipper socks- Performed by helper: Don/doff right sock, Don/doff left sock     Shoes - Performed by patient: Don/doff right shoe,  Don/doff left shoe Shoes - Performed by helper: Don/doff right shoe, Don/doff left shoe   AFO - Performed by helper: Don/doff right AFO   TED Hose - Performed by helper: Don/doff right TED hose, Don/doff left TED hose  Lower body assist Assist for lower body dressing: Touching or steadying assistance (Pt > 75%)       Toileting Toileting Toileting activity did not occur: Refused Toileting steps completed by patient: Adjust clothing prior to toileting, Performs perineal hygiene, Adjust clothing after toileting Toileting steps completed by helper: Adjust clothing prior to toileting, Performs perineal hygiene, Adjust clothing after toileting Toileting Assistive Devices: Grab bar or rail  Toileting assist Assist level: Touching or steadying assistance (Pt.75%)   Transfers Chair/bed transfer   Chair/bed transfer method: Squat pivot Chair/bed transfer assist level: Supervision or verbal cues Chair/bed transfer assistive device: Armrests Mechanical lift: Stedy   Locomotion Ambulation Ambulation activity did not occur: Safety/medical concerns   Max distance: 5 Assist level: Touching or steadying assistance (Pt > 75%)   Wheelchair   Type: Manual Max wheelchair distance: 150 ft Assist Level: Supervision or verbal cues  Cognition Comprehension Comprehension assist level: Follows complex conversation/direction with no assist  Expression Expression assist level: Expresses complex ideas: With extra time/assistive device  Social Interaction Social Interaction assist level: Interacts appropriately with others - No medications needed.  Problem Solving Problem solving assist level: Solves complex problems: Recognizes & self-corrects  Memory Memory assist level: Complete Independence: No helper    Medical Problem List and Plan: 1.  Decreased functional mobility secondary to lumbar stenosis with radiculopathy status post decompression L-2-L5. Back brace when out of bed  Continue CIR PT, OT2.  DVT Prophylaxis/Anticoagulation: SCDs.   Vascular study negative 3. Pain Management: Robaxin and oxycodone as needed  -appears controlled at present  -minimal dysesthesias RLE   -having periodic trunk pain likely related to movements/therapy   -continue ice, analgesics prn.   4. Mood: Provide emotional support  -pt's  mother recently passed 5. Neuropsych: This patient is capable of making decisions on his own behalf. 6. Skin/Wound Care: Routine skin checks. Monitor back incision. 7. Fluids/Electrolytes/Nutrition:    -PO intake good 8. Hypertension. Aldactone 25 mg daily, HCTZ 25 mg daily, Avapro 300 mg daily, Norvasc 5 mg daily.   - bp has recovered  -continue to hold hctz and norvasc---will not resume while here    Vitals:   12/25/16 1347 12/26/16 0530  BP: (!) 150/76 (!) 146/86  Pulse: 67 62  Resp: 18 18  Temp: 98.8 F (37.1 C) 98.8 F (37.1 C)   9. Prediabetic. Hemoglobin A1c 5.3. Blood sugar checks discontinued 10. Constipation. Laxative assistance with results 11. LE swelling may be related to D/ced diuretic  LOS (Days) 22 A FACE TO FACE EVALUATION WAS PERFORMED  Madisynn Plair E 12/26/2016 7:54 AM

## 2016-12-26 NOTE — Progress Notes (Signed)
Physical Therapy Session Note  Patient Details  Name: [name] MRN: [id number] Date of Birth: Jul 29, 1961  Today's Date: 12/26/2016 PT Individual Time: [id number] PT Individual Time Calculation (min): 53 min   Skilled Therapeutic Interventions/Progress Updates:    Pt received in w/c & agreeable to tx, denying c/o pain. Provided set up assist and pt donned back brace. Pt propelled w/c room<>dayroom with BUE & Mod I. Pt transferred w/c<>tilt table via stedy lift with max assist for sit<>stands. Pt with poor eccentric control during stand>sit 2/2 quad weakness. Pt tolerated standing on tilt table (at a max 75 degrees) for ~20 minutes. While on tilt table pt performed BLE mini squats and transitioned to having LLE with support and RLE without to focus on strengthening RLE. Therapist provided cuing for breathing during task, as well as for increased terminal knee extension. Pt very motivated and requesting to continue activity; pt did note BLE fatigue afterwards. Pt required mod assist to transfer sit<>supine on tilt table as pt has difficulty moving BLE on/off and requires assistance to push trunk to upright sitting position. At end of session pt left sitting in w/c in room with all needs within reach.  Pt reports no concerns regarding d/c.   Therapy Documentation Precautions:  Precautions Precautions: Back, Fall Required Braces or Orthoses: Spinal Brace Spinal Brace: Applied in sitting position, Lumbar corset Restrictions Weight Bearing Restrictions: No   See Function Navigator for Current Functional Status.   Therapy/Group: Individual Therapy  Waunita Schooner 12/26/2016, 5:05 PM

## 2016-12-27 ENCOUNTER — Inpatient Hospital Stay (HOSPITAL_COMMUNITY): Payer: Managed Care, Other (non HMO)

## 2016-12-27 ENCOUNTER — Inpatient Hospital Stay (HOSPITAL_COMMUNITY): Payer: Managed Care, Other (non HMO) | Admitting: Physical Therapy

## 2016-12-27 ENCOUNTER — Inpatient Hospital Stay (HOSPITAL_COMMUNITY): Payer: Managed Care, Other (non HMO) | Admitting: Occupational Therapy

## 2016-12-27 MED ORDER — AMLODIPINE BESYLATE 5 MG PO TABS
5.0000 mg | ORAL_TABLET | Freq: Every day | ORAL | Status: DC
  Administered 2016-12-27 – 2016-12-28 (×2): 5 mg via ORAL
  Filled 2016-12-27 (×2): qty 1

## 2016-12-27 NOTE — Progress Notes (Signed)
Social Work [name], male   DOB: [birth year], 56 y.o.   MRN: [id number]   Much time spent working on pt's DME on Friday with the "preferred" DME company for [employer], ultimately bringing up wrong DME and not all that was ordered.  At the end of the day, [employer] agreeing to allow [facility] to supply the DME and orders sent to them.  Will monitor today to make sure requested DME arrives.  Collan Schoenfeld, LCSW

## 2016-12-27 NOTE — Progress Notes (Addendum)
Occupational Therapy Session Note  Patient Details  Name: [name] MRN: [id number] Date of Birth: March 14, 1961  Today's Date: 12/27/2016 OT Individual Time: 1050-1200 and 1300-1345 OT Individual Time Calculation (min): 70 min and 45 min   Short Term Goals: Week 3:  OT Short Term Goal 1 (Week 3): STG=LTG due to LOS  Skilled Therapeutic Interventions/Progress Updates:    Session One: Pt seen for OT ADL bathing/dressing session. Pt sitting up in w/c upon arrival, agreeable to tx session and desiring to bathe at shower level. He completed supervision squat pivot into shower and bathed seated on shower chair with set-up assist. He stood with heavy reliance on grab bars for buttock hygiene to be completed total A. Increased assist required for transfer out of shower with VCs for hand placement and technique on grab bars. He dressed seated in w/c using reacher to assist with threading pants. Lateral leans from w/c onto EOB and into chair in order to pull pants up.  Discussed at length d/c planning and continnuum of care. Pt voiced feeling comfortable and confident with planned d/c home tomorrow.   Session Two: Pt seen for OT session focusing on UE/LE strengthening and activity tolerance. Pt sitting up in w/c upon arrival, ready for session. He declined needing to practice any other functional tasks in prep for d/c and agreeable to going to tx gym. He self propelled w/c to therapy gym mod I for UE strengthening. Pt provided with w/c gloves for hand protection. Supervision squat pivot completed to therapy mat. Seated on mat, completed strengthening circuit consisting of the following exercicses. Each completed x3 sets of 10 reps with rest breaks provided btwn sets.  R LE towel slides  L LE toe taps on small cup   Seated push ups on push up blocks Completed x3 sit <> stands at [place] with mod A. Worked on pt letting go of RW with one UE working to progress to funcitonal stand. Pt alternating hand supported  on RW to L hand on table. Pt able to alternate x3 before requiring seated rest break. Pt self propelled w/c back to room at end of session, left seated in w/c with all needs in reach.    Therapy Documentation Precautions:  Precautions Precautions: Back, Fall Required Braces or Orthoses: Spinal Brace Spinal Brace: Applied in sitting position, Lumbar corset Restrictions Weight Bearing Restrictions: No Pain:   No/ denies pain ADL: ADL ADL Comments: Please see functional navigator for ADL status  See Function Navigator for Current Functional Status.   Therapy/Group: Individual Therapy  Lewis, Eretria Manternach C 12/27/2016, 7:11 AM

## 2016-12-27 NOTE — Discharge Summary (Signed)
Discharge summary job [phone]

## 2016-12-27 NOTE — Progress Notes (Signed)
Occupational Therapy Discharge Summary  Patient Details  Name: [name] MRN: [id number] Date of Birth: 01-29-1961   Patient has met 7 of 7 long term goals due to improved activity tolerance, improved balance, postural control, ability to compensate for deficits and improved coordination.  Patient to discharge at overall Supervision level.  Patient's care partner is independent to provide the necessary physical assistance at discharge.  Pt d/[name] at overall supervision level from w/[name] level. Completed family education with pt's wife and reviewed squat pivot transfers and lateral leans for clothing management during LB dressing and toileting tasks. Also reviewed w/[name] push ups as another option when someone is assisting him with clothing management. Pt using AE for LB bathing/dressing tasks in order to adhere to back pre-cautions.  Have not practiced shower/tub transfers as pt only with half bath on first floor and unable to take stairs at this time. Reviewed recommendation with pt and pt's wife to only complete standing when home health therapists are present at time of d/[name], both voiced understanding.    Recommendation:  Patient will benefit from ongoing skilled OT services in home health setting to continue to advance functional skills in the area of BADL, iADL and Reduce care partner burden.  Equipment: Drop arm BSC  Reasons for discharge: treatment goals met and discharge from hospital  Patient/family agrees with progress made and goals achieved: Yes  OT Discharge Precautions/Restrictions  Precautions Precautions: Back;Fall Precaution Booklet Issued: Yes (comment) Precaution Comments: Pt able to recall 3/3 back precautions, demonstrates adherence to precautions during functional tasks Required Braces or Orthoses: Spinal Brace Spinal Brace: Applied in sitting position;Lumbar corset Restrictions Weight Bearing Restrictions: No ADL ADL ADL Comments: Please see functional navigator for  ADL status Vision/Perception  Vision- History Baseline Vision/History: Wears glasses Wears Glasses: Reading only Patient Visual Report: No change from baseline Vision- Assessment Vision Assessment?: No apparent visual deficits  Cognition Overall Cognitive Status: Within Functional Limits for tasks assessed Arousal/Alertness: Awake/alert Orientation Level: Oriented X4 Attention: Focused Focused Attention: Appears intact Memory: Appears intact Awareness: Appears intact Problem Solving: Appears intact Safety/Judgment: Appears intact Sensation Sensation Light Touch: Impaired Detail Light Touch Impaired Details: Impaired RLE;Impaired LLE Stereognosis: Not tested Hot/Cold: Not tested Proprioception Impaired Details: Impaired RLE;Impaired LLE (accurate 9/10 RLE, accurate 8/10 LLE at hallux) Coordination Gross Motor Movements are Fluid and Coordinated: No Fine Motor Movements are Fluid and Coordinated: Yes Heel Shin Test: impaired BLE Motor  Motor Motor: Paraplegia Motor - Discharge Observations: paraparesis, RLE > involvement than LLE Mobility  Bed Mobility Bed Mobility: Supine to Sit;Sit to Supine Supine to Sit: 6: Modified independent (Device/Increase time);With rails (with leg loops) Sit to Supine: 5: Supervision Sit to Supine - Details: Verbal cues for technique (use of leg lifter) Transfers Sit to Stand: 3: Mod assist;With upper extremity assist;From elevated surface Sit to Stand Details: Verbal cues for precautions/safety;Verbal cues for technique;Verbal cues for safe use of DME/AE Sit to Stand Details (indicate cue type and reason): assist for boosting while transitioning UE from mat>RW Stand to Sit: 3: Mod assist;With upper extremity assist Stand to Sit Details (indicate cue type and reason): Verbal cues for technique  Trunk/Postural Assessment  Cervical Assessment Cervical Assessment: Within Functional Limits Thoracic Assessment Thoracic Assessment: Exceptions to  [facility] (Back pre-cautions) Lumbar Assessment Lumbar Assessment: Exceptions to [facility] (Back pre-cautions) Postural Control Postural Control: Within Functional Limits  Balance Balance Balance Assessed: Yes Static Sitting Balance Static Sitting - Balance Support: Feet supported Static Sitting - Level of Assistance: 6: Modified  independent (Device/Increase time) Dynamic Sitting Balance Dynamic Sitting - Balance Support: Feet supported;During functional activity Dynamic Sitting - Level of Assistance: 6: Modified independent (Device/Increase time) Sitting balance - Comments: Sitting to complete bathing/dressing tasks Static Standing Balance Static Standing - Balance Support: Bilateral upper extremity supported Static Standing - Level of Assistance: 5: Stand by assistance;4: Min assist Static Standing - Comment/# of Minutes: Standing with RW, tolerates ~30-45 seconds of static stand before requiring seated rest break Extremity/Trunk Assessment RUE Assessment RUE Assessment: Within Functional Limits LUE Assessment LUE Assessment: Within Functional Limits   See Function Navigator for Current Functional Status.  [name], [name] 12/27/2016, 8:54 AM

## 2016-12-27 NOTE — Progress Notes (Signed)
Physical Therapy Session Note  Patient Details  Name: [name] MRN: [id number] Date of Birth: February 16, 1961  Today's Date: 12/27/2016 PT Individual Time: 1015-1043 PT Individual Time Calculation (min): 28 min   Short Term Goals: Week 3:  PT Short Term Goal 1 (Week 3): =LTG due to estimated LOS, downgraded to S/minA overall w/c level  Skilled Therapeutic Interventions/Progress Updates:    Pt denies concerns in regards to upcoming discharge. Verbally reviewed HEP for LE strengthening. W/c propulsion on unit modified independent for general strengthening and endurance. Squat pivot transfers with supervision and pt managing legrests. Sit <> stands from elevated mat table with RW with min to mod assist and marching in standing for balance, weightshifting, and strengthening x 3 repetitions of sit <> stands. Pt reports fatigue overall and rest breaks given.    Therapy Documentation Precautions:  Precautions Precautions: Back, Fall Precaution Booklet Issued: Yes (comment) Precaution Comments: Pt able to recall 3/3 back precautions, demonstrates adherence to precautions during functional tasks Required Braces or Orthoses: Spinal Brace Spinal Brace: Applied in sitting position, Lumbar corset Restrictions Weight Bearing Restrictions: No Pain: Pain Assessment Pain Assessment: No/denies pain  See Function Navigator for Current Functional Status.   Therapy/Group: Individual Therapy  Canary Brim Ivory Broad, PT, DPT  12/27/2016, 10:48 AM

## 2016-12-27 NOTE — Discharge Instructions (Signed)
Inpatient Rehab Discharge Instructions  [name] Discharge date and time: No discharge date for patient encounter.   Activities/Precautions/ Functional Status: Activity: Back brace when out of bed applied in sitting position Diet: regular diet Wound Care: keep wound clean and dry Functional status:  ___ No restrictions     ___ Walk up steps independently ___ 24/7 supervision/assistance   ___ Walk up steps with assistance ___ Intermittent supervision/assistance  ___ Bathe/dress independently ___ Walk with walker     _x__ Bathe/dress with assistance ___ Walk Independently    ___ Shower independently ___ Walk with assistance    ___ Shower with assistance ___ No alcohol     ___ Return to work/school ________    COMMUNITY REFERRALS UPON DISCHARGE:    Home Health:   PT     OT                     Agency: [facility]       Phone: [phone]   Medical Equipment/Items Ordered: wheelchair, cushion, commode                                                    Agency/Supplier:  [employer] @ [phone]    Special Instructions: No driving   My questions have been answered and I understand these instructions. I will adhere to these goals and the provided educational materials after my discharge from the hospital.  Patient/Caregiver Signature _______________________________ Date __________  Clinician Signature _______________________________________ Date __________  Please bring this form and your medication list with you to all your follow-up doctor's appointments.

## 2016-12-27 NOTE — Discharge Summary (Signed)
[name], [name] NO.:  [id number]  MEDICAL RECORD NO.:  [id number]  LOCATION:  4M10C                        FACILITY:  [facility]  PHYSICIAN:  Meredith Staggers, M.D.DATE OF BIRTH:  07-22-1961  DATE OF ADMISSION:  12/04/2016 DATE OF DISCHARGE:                              DISCHARGE SUMMARY   DISCHARGE DATE:  December 28, 2016.  DISCHARGE DIAGNOSES: 1. Decreased functional mobility secondary to lumbar stenosis with     radiculopathy, status post decompression. 2. SCDs for deep venous thrombosis prophylaxis. 3. Pain management. 4. Hypertension. 5. Constipation, resolved.  HISTORY OF PRESENT ILLNESS:  This is a 56 year old right-handed male, history of hypertension, prediabetic, lives with wife and daughter. Wife works during the day.  Daughter recently lost her job and can assist.  The patient has not worked for about a year due to increasing back pain.  Presented, December 01, 2016, with low back pain radiating to lower extremities.  Imaging showed severe lumbar stenosis with radiculopathy.  No change with conservative care.  Underwent lumbar decompression L2-L5, December 01, 2016, per Dr. Rolena Infante.  Hospital course, pain management.  Back brace applied when out of bed.  Bouts of constipation resolved with laxative assistance.  Physical and occupational therapy ongoing.  The patient was admitted for a comprehensive rehab program.  PAST MEDICAL HISTORY:  See discharge diagnoses.  SOCIAL HISTORY:  Lives with wife and daughter.  Two-level home, bedroom upstairs.  FUNCTIONAL STATUS:  Upon admission to rehab services was able to get into the shower just prior to his surgery.  Mod to max assist, bed mobility.  Ambulating 2 feet rolling walker.  Max total assist, activities of daily living.  PHYSICAL EXAMINATION:  VITAL SIGNS:  Blood pressure 151/82, pulse 70, temperature 99, respirations 18. GENERAL:  This is an alert male, in no acute distress.  Oriented  x3. EYES:  Pupils round and reactive to light. NECK:  Supple.  Nontender.  No JVD. CARDIAC:  Regular rhythm without murmur. ABDOMEN:  Soft, nontender.  Good bowel sounds. NEUROLOGIC:  Motor strength; 5/5 upper extremities, 4-/5 left lower extremity hip flexors, 4/5 ankle dorsiflexor and plantar flexion.  Right lower extremity, 2/5 hip flexors.  REHABILITATION HOSPITAL COURSE:  The patient was admitted to inpatient rehab services with therapies initiated on a 3-hour daily basis, consisting of physical therapy, occupational therapy, and rehabilitation nursing.  The following issues were addressed during the patient's rehabilitation stay.  Pertaining to Mr. Wragg' lumbar radiculopathy, he had undergone decompression per Dr. Rolena Infante.  Back brace when out of bed. Neurovascular sensation intact.  SCDs for DVT prophylaxis.  Venous Doppler studies negative.  Pain management with use of oxycodone as well as Robaxin with good results.  Blood pressures controlled on Avapro. Bouts of orthostatic hypotension. Norvasc and hydrochlorothiazide held for a short time. Norvasc was resumed 12/27/2016.  Bouts of constipation resolved with laxative assistance.  Hemoglobin A1c of 5.3 with documented history of prediabetic blood sugars well controlled and discontinued.  Emotional support was provided, his mother had recently passed away.  The patient received weekly collaborative interdisciplinary team conferences to discuss estimated length of stay, family teaching, any barriers to discharge.  The  patient propelled wheelchair modified independent.  Transferred wheelchair to tilt-table max assist.  Tolerated standing in the tilt-table for greater than 20 minutes.  Required moderate assist transfer, sit to supine on tilt- table.  Performed marching in place x10-12 steps x2 trials.  Activities of daily living and homemaking.  Min assist squat pivot, completed to shower chair with heavy reliance on grab bars.  He  bathed seated on shower chair using a sponge.  He stood with heavy reliance on grab bars for buttock hygiene, increased assist required for squat pivot out of shower transfers.  Full family teaching was completed and plan discharge to home.  DISCHARGE MEDICATIONS:  Include: 1. Vitamin D 2000 units daily. 2. Avapro 150 mg p.o. daily. 3. Robaxin 500 mg p.o. every 6 hours as needed muscle spasms. 4. Oxycodone 1-2 tablets every 4 hours as needed pain. 5. Norvasc 5 mg daily   DIET:  Diet was regular.  FOLLOWUP:  The patient would follow up with Dr. Alger Simons at the outpatient rehab service office as directed; Dr. Rolena Infante, orthopedic services; Dr. Delia Chimes, Medical Management.  SPECIAL INSTRUCTIONS:  Back brace when out of bed.  The patient had been on aspirin prior to admission.  This could be resumed at the discretion of Orthopedic Services.     Lauraine Rinne, P.A.   ______________________________ Meredith Staggers, M.D.    DA/MEDQ  D:  12/27/2016  T:  12/27/2016  Job:  [id number]  cc:   Dr. Enis Gash, M.D. Dahari D. Rolena Infante, M.D.

## 2016-12-27 NOTE — Progress Notes (Signed)
Physical Therapy Discharge Summary  Patient Details  Name: [name] MRN: [id number] Date of Birth: [birth year]  Today's Date: 12/27/2016 PT Individual Time: 0800-0900 PT Individual Time Calculation (min): 60 min    Patient has met 7 of 8 long term goals due to improved activity tolerance, improved balance, improved postural control, increased strength, ability to compensate for deficits and functional use of  right lower extremity and left lower extremity.  Patient to discharge at a wheelchair level Min Assist.   Patient's care partner is independent to provide the necessary physical assistance at discharge.  Reasons goals not met: Ambulation goal unmet d/t continued strength deficits in RLE  Recommendation:  Patient will benefit from ongoing skilled PT services in home health setting to continue to advance safe functional mobility, address ongoing impairments in strength, coordination, balance, activity tolerance, and minimize fall risk.  Equipment: W/c  Reasons for discharge: treatment goals met and discharge from hospital  Patient/family agrees with progress made and goals achieved: Yes  PT Discharge Precautions/Restrictions Precautions Precautions: Back;Fall Precaution Booklet Issued: Yes (comment) Precaution Comments: Pt able to recall 3/3 back precautions, demonstrates adherence to precautions during functional tasks Required Braces or Orthoses: Spinal Brace Spinal Brace: Applied in sitting position;Lumbar corset Restrictions Weight Bearing Restrictions: No Pain Pain Assessment Pain Assessment: No/denies pain Vision/Perception    WFL Cognition Overall Cognitive Status: Within Functional Limits for tasks assessed Arousal/Alertness: Awake/alert Orientation Level: Oriented X4 Attention: Focused Focused Attention: Appears intact Memory: Appears intact Awareness: Appears intact Problem Solving: Appears intact Safety/Judgment: Appears  intact Sensation Sensation Light Touch: Impaired Detail Light Touch Impaired Details: Impaired RLE;Impaired LLE Stereognosis: Not tested Hot/Cold: Not tested Proprioception Impaired Details: Impaired RLE;Impaired LLE (accurate 9/10 RLE, accurate 8/10 LLE at hallux) Coordination Gross Motor Movements are Fluid and Coordinated: No Fine Motor Movements are Fluid and Coordinated: Yes Heel Shin Test: impaired BLE Motor  Motor Motor: Paraplegia Motor - Discharge Observations: paraparesis, RLE > involvement than LLE  Mobility Bed Mobility Bed Mobility: Supine to Sit;Sit to Supine Supine to Sit: 6: Modified independent (Device/Increase time);With rails (with leg loops) Sit to Supine: 5: Supervision Sit to Supine - Details: Verbal cues for technique (use of leg lifter) Transfers Transfers: Yes Sit to Stand: 3: Mod assist;With upper extremity assist;From elevated surface Sit to Stand Details: Verbal cues for precautions/safety;Verbal cues for technique;Verbal cues for safe use of DME/AE Sit to Stand Details (indicate cue type and reason): assist for boosting while transitioning UE from mat>RW Stand to Sit: 3: Mod assist;With upper extremity assist Stand to Sit Details (indicate cue type and reason): Verbal cues for technique Squat Pivot Transfers: 6: Modified independent (Device/Increase time) Locomotion  Ambulation Ambulation: Yes Ambulation/Gait Assistance: 4: Min assist;4: Min guard Ambulation Distance (Feet): 5 Feet Assistive device: Parallel bars Ambulation/Gait Assistance Details: Tactile cues for weight beaing;Verbal cues for precautions/safety;Verbal cues for technique Ambulation/Gait Assistance Details: assist for RLE stance control Gait Gait: Yes Gait Pattern: Impaired Gait Pattern: Right flexed knee in stance;Poor foot clearance - right;Trunk flexed;Decreased stride length;Decreased weight shift to right (heavy reliance on upper body) Gait velocity: decreased Stairs /  Additional Locomotion Stairs: No Wheelchair Mobility Wheelchair Mobility: Yes Wheelchair Assistance: 6: Modified independent (Device/Increase time) Occupational hygienist: Both upper extremities Wheelchair Parts Management: Independent Distance: >175'  Trunk/Postural Assessment  Cervical Assessment Cervical Assessment: Within Functional Limits Thoracic Assessment Thoracic Assessment: Exceptions to [facility] (Back pre-cautions) Lumbar Assessment Lumbar Assessment: Exceptions to WFL (Back pre-cautions) Postural Control Postural Control: Within Functional Limits  Balance Balance Balance  Assessed: Yes Static Sitting Balance Static Sitting - Balance Support: Feet supported Static Sitting - Level of Assistance: 6: Modified independent (Device/Increase time) Dynamic Sitting Balance Dynamic Sitting - Balance Support: Feet supported;During functional activity Dynamic Sitting - Level of Assistance: 6: Modified independent (Device/Increase time) Sitting balance - Comments: Sitting to complete bathing/dressing tasks Static Standing Balance Static Standing - Balance Support: Bilateral upper extremity supported Static Standing - Level of Assistance: 5: Stand by assistance;4: Min assist Static Standing - Comment/# of Minutes: Standing with RW, tolerates ~30-45 seconds of static stand before requiring seated rest break Extremity Assessment  RUE Assessment RUE Assessment: Within Functional Limits LUE Assessment LUE Assessment: Within Functional Limits RLE Assessment RLE Assessment: Exceptions to [facility] (2/5 throughout) LLE Assessment LLE Assessment: Exceptions to [facility] (3/5 throughout)  Skilled Therapeutic Intervention: Pt received supine in bed, denies pain and agreeable to treatment. Pants, TEDs and shoes donned totalA for time management. Supine>sit with S and leg lifter. Transfer bed>w/c setupA for positioning of w/c. W/c propulsion modI x150' with BUE. Pt performed car transfer with modI including  w/c setup, parts management. Sit <>stand from mat table at height of w/c with modA and RW. Gait in parallel bars forward/backward 5' each with min guard and facilitation at R knee for stance control. Sit >stand in parallel bars min guard due to heavy UE assist. Remained seated in w/c at end of session, all needs in reach.   See Function Navigator for Current Functional Status.  Benjiman Core Tygielski 12/27/2016, 9:13 AM

## 2016-12-27 NOTE — Progress Notes (Signed)
[facility] PHYSICAL MEDICINE & REHABILITATION     PROGRESS NOTE  Subjective/Complaints:   Feeling well. bp has started to trend back up.   ROS: pt denies nausea, vomiting, diarrhea, cough, shortness of breath or chest pain    Objective: Vital Signs: Blood pressure (!) 163/89, pulse 64, temperature 98.5 F (36.9 C), temperature source Oral, resp. rate 16, height 6\' 7"  (2.007 m), weight 117.5 kg (259 lb 0.7 oz), SpO2 100 %. No results found. No results for input(s): WBC, HGB, HCT, PLT in the last 72 hours. No results for input(s): NA, K, CL, GLUCOSE, BUN, CREATININE, CALCIUM in the last 72 hours.  Invalid input(s): CO CBG (last 3)  No results for input(s): GLUCAP in the last 72 hours.  Wt Readings from Last 3 Encounters:  12/22/16 117.5 kg (259 lb 0.7 oz)  11/25/16 122.8 kg (270 lb 11.2 oz)  11/03/16 117 kg (258 lb)    Physical Exam:  BP (!) 163/89 (BP Location: Left Arm)   Pulse 64   Temp 98.5 F (36.9 C) (Oral)   Resp 16   Ht 6\' 7"  (2.007 m)   Wt 117.5 kg (259 lb 0.7 oz)   SpO2 100%   BMI 29.18 kg/m  Constitutional: NAD HENT: Normocephalic. Atraumatic Eyes: EOM are normal. No discharge.  Cardiovascular: RRR Respiratory:  Clear without wheezes GI: Soft. Bowel sounds are normal.  Musculoskeletal: tr + LE edema.  Neurological: He is alert and oriented.  Sensation diminished to light touch RLE 1 to 1+/2 without change Motor: B/l UE 5/5 proximal to distal LLE: 4-/5 HF, KE, 4/5 ADP/PF--stable RLE: 2+/5 HF, 2+ to 3-/5 KE, 2/5 ADP/PF --no changes Skin: wound intact Psychiatric: remains flat but appropriate   Assessment/Plan: 1. Functional deficits secondary to lumbar stenosis with radiculopathy status post decompression L-2-L5 which require 3+ hours per day of interdisciplinary therapy in a comprehensive inpatient rehab setting. Physiatrist is providing close team supervision and 24 hour management of active medical problems listed below. Physiatrist and rehab team  continue to assess barriers to discharge/monitor patient progress toward functional and medical goals.  Function:  Bathing Bathing position   Position: Shower  Bathing parts Body parts bathed by patient: Right arm, Left arm, Chest, Abdomen, Left upper leg, Right lower leg, Right upper leg, Left lower leg, Back, Front perineal area Body parts bathed by helper: Buttocks  Bathing assist Assist Level: Touching or steadying assistance(Pt > 75%)   Set up : To obtain items  Upper Body Dressing/Undressing Upper body dressing   What is the patient wearing?: Pull over shirt/dress     Pull over shirt/dress - Perfomed by patient: Thread/unthread right sleeve, Thread/unthread left sleeve, Put head through opening, Pull shirt over trunk       Orthosis activity level: Performed by patient  Upper body assist Assist Level: Set up   Set up : To obtain clothing/put away  Lower Body Dressing/Undressing Lower body dressing   What is the patient wearing?: Underwear, Pants, [employer], Shoes Underwear - Performed by patient: Thread/unthread right underwear leg, Thread/unthread left underwear leg Underwear - Performed by helper: Pull underwear up/down Pants- Performed by patient: Thread/unthread left pants leg, Thread/unthread right pants leg Pants- Performed by helper: Pull pants up/down Non-skid slipper socks- Performed by patient: Don/doff right sock, Don/doff left sock (Sock aid) Non-skid slipper socks- Performed by helper: Don/doff right sock, Don/doff left sock     Shoes - Performed by patient: Don/doff right shoe, Don/doff left shoe Shoes - Performed by helper:  Don/doff right shoe, Don/doff left shoe   AFO - Performed by helper: Don/doff right AFO   TED Hose - Performed by helper: Don/doff right TED hose, Don/doff left TED hose  Lower body assist Assist for lower body dressing: Touching or steadying assistance (Pt > 75%)      Toileting Toileting Toileting activity did not occur:  Refused Toileting steps completed by patient: Adjust clothing prior to toileting, Performs perineal hygiene, Adjust clothing after toileting Toileting steps completed by helper: Adjust clothing prior to toileting, Performs perineal hygiene, Adjust clothing after toileting Toileting Assistive Devices: Grab bar or rail  Toileting assist Assist level: Touching or steadying assistance (Pt.75%)   Transfers Chair/bed transfer   Chair/bed transfer method: Squat pivot Chair/bed transfer assist level: Supervision or verbal cues Chair/bed transfer assistive device: Armrests Mechanical lift: Stedy   Locomotion Ambulation Ambulation activity did not occur: Safety/medical concerns   Max distance: 5 Assist level: Touching or steadying assistance (Pt > 75%)   Wheelchair   Type: Manual Max wheelchair distance: >150 ft Assist Level: No help, No cues, assistive device, takes more than reasonable amount of time  Cognition Comprehension Comprehension assist level: Follows complex conversation/direction with no assist  Expression Expression assist level: Expresses complex ideas: With no assist  Social Interaction Social Interaction assist level: Interacts appropriately with others - No medications needed.  Problem Solving Problem solving assist level: Solves complex problems: Recognizes & self-corrects  Memory Memory assist level: Complete Independence: No helper    Medical Problem List and Plan: 1.  Decreased functional mobility secondary to lumbar stenosis with radiculopathy status post decompression L-2-L5. Back brace when out of bed  Continue CIR PT, OT2.  DVT Prophylaxis/Anticoagulation: SCDs.   Vascular study negative 3. Pain Management: Robaxin and oxycodone as needed  -appears controlled at present  -minimal dysesthesias RLE   -having periodic trunk pain likely related to movements/therapy   -continue ice, analgesics prn.   4. Mood: Provide emotional support  -pt's mother recently  passed 5. Neuropsych: This patient is capable of making decisions on his own behalf. 6. Skin/Wound Care: Routine skin checks. Monitor back incision. 7. Fluids/Electrolytes/Nutrition:    -PO intake good 8. Hypertension. Aldactone 25 mg daily, HCTZ 25 mg daily, Avapro 300 mg daily, Norvasc 5 mg daily.   - bp trending back up  -resume norvasc  -continue to hold hctz  --will not resume while here    Vitals:   12/26/16 1400 12/27/16 0435  BP: (!) 143/75 (!) 163/89  Pulse: (!) 56 64  Resp: 16 16  Temp: 97.9 F (36.6 C) 98.5 F (36.9 C)   9. Prediabetic. Hemoglobin A1c 5.3. Blood sugar checks discontinued 10. Constipation. Laxative assistance with results 11. LE swelling may be related to D/ced diuretic  -elevation, local care  LOS (Days) 23 A FACE TO FACE EVALUATION WAS PERFORMED  SWARTZ,ZACHARY T 12/27/2016 8:47 AM

## 2016-12-28 MED ORDER — OXYCODONE-ACETAMINOPHEN 5-325 MG PO TABS: 1.0000 | ORAL_TABLET | ORAL | 0 refills | Status: DC | PRN

## 2016-12-28 MED ORDER — AMLODIPINE BESYLATE 5 MG PO TABS: 5.0000 mg | ORAL_TABLET | Freq: Every day | ORAL | 0 refills | Status: DC

## 2016-12-28 MED ORDER — IRBESARTAN 150 MG PO TABS: 150.0000 mg | ORAL_TABLET | Freq: Every day | ORAL | 1 refills | Status: DC

## 2016-12-28 MED ORDER — CHOLECALCIFEROL 50 MCG (2000 UT) PO TABS: 2000.0000 [IU] | ORAL_TABLET | Freq: Every day | ORAL | 0 refills | Status: DC

## 2016-12-28 MED ORDER — METHOCARBAMOL 500 MG PO TABS: 500.0000 mg | ORAL_TABLET | Freq: Four times a day (QID) | ORAL | 0 refills | Status: DC | PRN

## 2016-12-28 NOTE — Progress Notes (Signed)
Social Work  Discharge Note  The overall goal for the admission was met for:   Discharge location: Yes - home with wife and daughter to provide assistance  Length of Stay: Yes - 24 days  Discharge activity level: Yes - min assist overall  Home/community participation: Yes  Services provided included: MD, RD, PT, OT, RN, TR, Pharmacy and [place]: Private Insurance: [employer]  Follow-up services arranged: Home Health: PT, OT via [facility] [place], DME: 20x20 lightweight w/c with Ulice Dash 3 back cushion and Roho seat cushion, wide drop arm commode via [place] and Patient/Family has no preference for HH/DME agencies  Comments (or additional information):  Patient/Family verbalized understanding of follow-up arrangements: Yes  Individual responsible for coordination of the follow-up plan: pt  Confirmed correct DME delivered: Dusti Tetro 12/28/2016    Haldon Carley

## 2016-12-28 NOTE — Progress Notes (Signed)
[facility]     PROGRESS NOTE  Subjective/Complaints:   No new issues. Feels prepared to go home  ROS: pt denies nausea, vomiting, diarrhea, cough, shortness of breath or chest pain    Objective: Vital Signs: Blood pressure (!) 142/84, pulse 80, temperature 98.3 F (36.8 C), temperature source Oral, resp. rate 17, height 6\' 7"  (2.007 m), weight 117.5 kg (259 lb 0.7 oz), SpO2 98 %. No results found. No results for input(s): WBC, HGB, HCT, PLT in the last 72 hours. No results for input(s): NA, K, CL, GLUCOSE, BUN, CREATININE, CALCIUM in the last 72 hours.  Invalid input(s): CO CBG (last 3)  No results for input(s): GLUCAP in the last 72 hours.  Wt Readings from Last 3 Encounters:  12/22/16 117.5 kg (259 lb 0.7 oz)  11/25/16 122.8 kg (270 lb 11.2 oz)  11/03/16 117 kg (258 lb)    Physical Exam:  BP (!) 142/84 (BP Location: Left Arm)   Pulse 80   Temp 98.3 F (36.8 C) (Oral)   Resp 17   Ht 6\' 7"  (2.007 m)   Wt 117.5 kg (259 lb 0.7 oz)   SpO2 98%   BMI 29.18 kg/m  Constitutional: NAD HENT: Normocephalic. Atraumatic Eyes: EOM are normal. No discharge.  Cardiovascular: RRR Respiratory:  Clear without wheezes GI: Soft. Bowel sounds are normal.  Musculoskeletal: tr edema.  Neurological: He is alert and oriented.  Sensation diminished to light touch RLE 1 to 1+/2 without change Motor: B/l UE 5/5 proximal to distal LLE: 4-/5 HF, KE, 4/5 ADP/PF--stable RLE: 2+/5 HF, 2+ to 3-/5 KE, 2/5 ADP/PF -stable Skin: wound intact Psychiatric: remains flat but appropriate   Assessment/Plan: 1. Functional deficits secondary to lumbar stenosis with radiculopathy status post decompression L-2-L5 which require 3+ hours per day of interdisciplinary therapy in a comprehensive inpatient rehab setting. Physiatrist is providing close team supervision and 24 hour management of active medical problems listed below. Physiatrist and rehab team continue to assess  barriers to discharge/monitor patient progress toward functional and medical goals.  Function:  Bathing Bathing position   Position: Shower  Bathing parts Body parts bathed by patient: Right arm, Left arm, Chest, Abdomen, Left upper leg, Right lower leg, Right upper leg, Left lower leg, Back, Front perineal area Body parts bathed by helper: Buttocks  Bathing assist Assist Level: Touching or steadying assistance(Pt > 75%)   Set up : To obtain items  Upper Body Dressing/Undressing Upper body dressing   What is the patient wearing?: Pull over shirt/dress     Pull over shirt/dress - Perfomed by patient: Thread/unthread right sleeve, Thread/unthread left sleeve, Put head through opening, Pull shirt over trunk       Orthosis activity level: Performed by patient  Upper body assist Assist Level: No help, No cues   Set up : To obtain clothing/put away  Lower Body Dressing/Undressing Lower body dressing   What is the patient wearing?: Underwear, Pants, [employer], Shoes Underwear - Performed by patient: Thread/unthread right underwear leg, Thread/unthread left underwear leg, Pull underwear up/down Underwear - Performed by helper: Pull underwear up/down Pants- Performed by patient: Thread/unthread left pants leg, Thread/unthread right pants leg, Pull pants up/down Pants- Performed by helper: Pull pants up/down Non-skid slipper socks- Performed by patient: Don/doff right sock, Don/doff left sock (Sock aid) Non-skid slipper socks- Performed by helper: Don/doff right sock, Don/doff left sock     Shoes - Performed by patient: Don/doff right shoe, Don/doff left shoe Shoes -  Performed by helper: Don/doff right shoe, Don/doff left shoe   AFO - Performed by helper: Don/doff right AFO   TED Hose - Performed by helper: Don/doff right TED hose, Don/doff left TED hose  Lower body assist Assist for lower body dressing: Supervision or verbal cues      Toileting Toileting Toileting activity did not  occur: Refused Toileting steps completed by patient: Adjust clothing prior to toileting, Performs perineal hygiene, Adjust clothing after toileting Toileting steps completed by helper: Adjust clothing prior to toileting, Performs perineal hygiene, Adjust clothing after toileting Toileting Assistive Devices: Other (comment) (lateral leans onto bed/ w/c from drop arm BSC)  Toileting assist Assist level: Supervision or verbal cues   Transfers Chair/bed transfer   Chair/bed transfer method: Squat pivot Chair/bed transfer assist level: No Help, no cues, assistive device, takes more than a reasonable amount of time Chair/bed transfer assistive device: Armrests Mechanical lift: Ecologist Ambulation activity did not occur: Safety/medical concerns   Max distance: 5 Assist level: Touching or steadying assistance (Pt > 75%)   Wheelchair   Type: Manual Max wheelchair distance: >150 ft Assist Level: No help, No cues, assistive device, takes more than reasonable amount of time  Cognition Comprehension Comprehension assist level: Follows complex conversation/direction with no assist  Expression Expression assist level: Expresses complex ideas: With no assist  Social Interaction Social Interaction assist level: Interacts appropriately with others - No medications needed.  Problem Solving Problem solving assist level: Solves complex problems: Recognizes & self-corrects  Memory Memory assist level: Complete Independence: No helper    Medical Problem List and Plan: 1.  Decreased functional mobility secondary to lumbar stenosis with radiculopathy status post decompression L-2-L5. Back brace when out of bed  -home today  -reviewed HEP.   -hh follow up arranged  -Patient to see me in the office for transitional care encounter in 1-2 weeks. 2.  DVT Prophylaxis/Anticoagulation: SCDs.   Vascular study negative 3. Pain Management: Robaxin and oxycodone as needed  -appears controlled  at present  -minimal dysesthesias RLE   -having periodic trunk pain likely related to movements/therapy   -continue ice, analgesics prn.   4. Mood: Provide emotional support  -pt's mother recently passed 5. Neuropsych: This patient is capable of making decisions on his own behalf. 6. Skin/Wound Care: Routine skin checks. Monitor back incision. 7. Fluids/Electrolytes/Nutrition:    -PO intake good 8. Hypertension. Aldactone 25 mg daily, HCTZ 25 mg daily, Avapro 300 mg daily, Norvasc 5 mg daily.   - bp better this morning  -resumed norvasc---continue at discharge  -continue to hold hctz  --resume as outpt if needed    Vitals:   12/27/16 1413 12/28/16 0540  BP: (!) 159/88 (!) 142/84  Pulse:  80  Resp: 16 17  Temp: 99.9 F (37.7 C) 98.3 F (36.8 C)   9. Prediabetic. Hemoglobin A1c 5.3. Blood sugar checks discontinued 10. Constipation. Laxative assistance with results 11. LE swelling    -off hctz  -elevation, local care  LOS (Days) 24 A FACE TO FACE EVALUATION WAS PERFORMED  Lakita Sahlin T 12/28/2016 8:33 AM

## 2016-12-28 NOTE — Progress Notes (Signed)
Patient discharged to home accompanied by his wife.

## 2016-12-30 ENCOUNTER — Telehealth: Payer: Self-pay | Admitting: *Deleted

## 2016-12-30 NOTE — Telephone Encounter (Signed)
Transitional Care call completed, appointment confirmed, although patient admits it may be difficult to make.  Will try all the same. New visit packet has been mailed to patient's confirmed address  Transitional Care Questions  Questions for our staff to ask patients on Transitional care 48 hour phone call:   1. Are you/is patient experiencing any problems since coming home? No Are there any questions regarding any aspect of care? No  2. Are there any questions regarding medications administration/dosing? No  Are meds being taken as prescribed?  Yes Patient should review meds with caller to confirm   3. Have there been any falls? No  4. Has Home Health been to the house and/or have they contacted you? Yes  If not, have you tried to contact them? Can we help you contact them?   5. Are bowels and bladder emptying properly?  Yes Are there any unexpected incontinence issues? If applicable, is patient following bowel/bladder programs?  6. Any fevers, problems with breathing, unexpected pain? No   7. Are there any skin problems or new areas of breakdown? No  8. Has the patient/family member arranged specialty MD follow up (ie cardiology/neurology/renal/surgical/etc)? No  Can we help arrange? No   9. Does the patient need any other services or support that we can help arrange? No   10. Are caregivers following through as expected in assisting the patient? No    11. Has the patient quit smoking, drinking alcohol, or using drugs as recommended? Yes

## 2017-01-04 ENCOUNTER — Telehealth: Payer: Self-pay

## 2017-01-04 NOTE — Telephone Encounter (Signed)
Called to inform us that she will be his nurse case manager from [place]

## 2017-01-05 ENCOUNTER — Encounter
Payer: Managed Care, Other (non HMO) | Attending: Physical Medicine & Rehabilitation | Admitting: Physical Medicine & Rehabilitation

## 2017-01-05 ENCOUNTER — Encounter: Payer: Self-pay | Admitting: Physical Medicine & Rehabilitation

## 2017-01-05 VITALS — BP 165/93 | HR 51

## 2017-01-05 DIAGNOSIS — R29898 Other symptoms and signs involving the musculoskeletal system: Secondary | ICD-10-CM

## 2017-01-05 DIAGNOSIS — I10 Essential (primary) hypertension: Secondary | ICD-10-CM

## 2017-01-05 DIAGNOSIS — M48061 Spinal stenosis, lumbar region without neurogenic claudication: Secondary | ICD-10-CM | POA: Insufficient documentation

## 2017-01-05 DIAGNOSIS — R269 Unspecified abnormalities of gait and mobility: Secondary | ICD-10-CM | POA: Diagnosis not present

## 2017-01-05 DIAGNOSIS — R634 Abnormal weight loss: Secondary | ICD-10-CM | POA: Insufficient documentation

## 2017-01-05 DIAGNOSIS — E119 Type 2 diabetes mellitus without complications: Secondary | ICD-10-CM | POA: Diagnosis not present

## 2017-01-05 DIAGNOSIS — M5416 Radiculopathy, lumbar region: Secondary | ICD-10-CM | POA: Diagnosis present

## 2017-01-05 DIAGNOSIS — M7989 Other specified soft tissue disorders: Secondary | ICD-10-CM | POA: Diagnosis not present

## 2017-01-05 DIAGNOSIS — M1990 Unspecified osteoarthritis, unspecified site: Secondary | ICD-10-CM | POA: Insufficient documentation

## 2017-01-05 MED ORDER — HYDROCHLOROTHIAZIDE 25 MG PO TABS: 25.0000 mg | ORAL_TABLET | Freq: Every day | ORAL | 3 refills | Status: DC

## 2017-01-05 NOTE — Patient Instructions (Signed)
HOLD AMLODIPINE FOR NOW.   CHECK BP'S DAILY DRINK NORMALLY AS YOU OTHERWISE WOULD CONTINUE EXERCISING AND STRENGTHENING YOUR LEGS DAILY  PLEASE FEEL FREE TO CALL OUR OFFICE WITH ANY PROBLEMS OR QUESTIONS [phone])

## 2017-01-05 NOTE — Progress Notes (Signed)
Subjective:    [name], male    DOB: Mar 27, 1961, 56 y.o.   MRN: [id number]  HPI   This is a transitional care visit   [name] is here in follow up of his lumbar radiculopathy and inpatient rehab stay. He has been doing fairly well. He has noticed some increased movement, perhaps spasms in right hip and thigh. There has been increased burning sensations in the right leg as well. He is not taking anything for pain currently.  Therapy is coming to the house 3x per week. He is independent with self-care/toileting. He is doing some basic standing and walking in place.  He is also noticing increased swelling in his legs. He is wearing TEDS during the day most of the time. He has not been checking his bp regularly  His bowel and bladder appear to be functioning normally. He denies any shortness of breath.   Pain Inventory Average Pain 0 Pain Right Now 0 My pain is no pain  In the last 24 hours, has pain interfered with the following? General activity 0 Relation with others 0 Enjoyment of life 0 What TIME of day is your pain at its worst? no pain Sleep (in general) Good  Pain is worse with: no pain Pain improves with: no pain Relief from Meds: no pain  Mobility walk with assistance use a walker ability to climb steps?  no do you drive?  no use a wheelchair transfers alone Do you have any goals in this area?  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  no  Physicians involved in your care Any changes since last visit?  no   Family History  Problem Relation Age of Onset  . Diabetes Mother   . Hypertension Mother   . Diabetes Father   . Hypertension Father    Social History   Social History  . Marital status: Married    Spouse name: N/A  . Number of children: N/A  . Years of education: N/A    Social History Main Topics  . Smoking status: Never Smoker  . Smokeless tobacco: Never Used  . Alcohol use No  . Drug use: No  . Sexual activity: Not Asked   Other Topics Concern  . None   Social History Narrative  . None   Past Surgical History:  Procedure Laterality Date  . LAPAROSCOPIC GASTRIC SLEEVE RESECTION    . LUMBAR LAMINECTOMY  12/01/2016   Lumbar decompression L2-5 LUMBAR LAMINECTOMY/DECOMPRESSION MICRODISCECTOMY 3 LEVELS (N/A)  . LUMBAR LAMINECTOMY/DECOMPRESSION MICRODISCECTOMY N/A 12/01/2016   Procedure: Lumbar decompression L2-5 LUMBAR LAMINECTOMY/DECOMPRESSION MICRODISCECTOMY 3 LEVELS;  Surgeon: Melina Schools, MD;  Location: [facility];  Service: Orthopedics;  Laterality: N/A;   Past Medical History:  Diagnosis Date  . Arthritis   . Diabetes mellitus without complication ([place]) Resolved 2014 after weight loss  . Hyperlipidemia Resolved 2014 after weight loss  . Hypertension   . Weight loss    BP (!) 165/93 (BP Location: Right Arm, Patient Position: Sitting, Cuff Size: Large)   Pulse (!) 51   SpO2 95%   Opioid Risk Score:   Fall Risk Score:  `1  Depression screen PHQ 2/9  Depression screen PHQ 2/9 11/03/2016  Decreased Interest 0  Down, Depressed, Hopeless 0  PHQ - 2 Score 0    Review of Systems  Constitutional: Negative.  HENT: Negative.   Eyes: Negative.   Respiratory: Negative.   Cardiovascular: Positive for leg swelling.  Gastrointestinal: Negative.   Endocrine: Negative.   Genitourinary: Negative.   Musculoskeletal: Positive for gait problem.  Skin: Negative.   Allergic/Immunologic: Negative.   Hematological: Negative.   Psychiatric/Behavioral: Negative.        Objective:   Physical Exam  HENT: Normocephalic. Atraumatic Eyes: EOM are normal. No discharge.  Cardiovascular: RRR Respiratory:  CTA B GI: soft.  Musculoskeletal: tr edema.  Neurological: He is alert and oriented.  Sensation diminished to light touch RLE 1 to 1+/2  without change primarily along L2-4 dermatomes Motor: B/l UE 5/5 proximal to distal LLE: 4-/5 HF, KE, 4/5 ADP/PF--stable RLE: 2+ to 3-/5 HF,  3-/5 KE, 3/5 ADP/PF  Skin: back incision intact. Right knee scab Psychiatric:  flat but appropriate     Assessment & Plan:  1. Decreased functional mobility secondary to lumbar stenosis with radiculopathy status post decompression L-2-L5. Back brace when out of bed  -continue HEP  -will transition to outpt therapies over next few weeks. ?aquatic therapy candidate  2. Pain Management: improved  -having some intermittent neuropathic pain.   -will observe only for now. Patient doe not want to be on anything for pain at this time.  -described how it is related to his nerve recovery/evolution of injur 4 Hypertension. Aldactone 25 mg daily, HCTZ 25 mg daily, Avapro 300 mg daily, Norvasc 5 mg daily.              - resume hctz  -hold norvasc  -asked him to check BP daily.  -normal fluid intake  -if continued elevation he was advised to contact me 5. LE swelling               -resume hctz separately             -elevation, compression stockings  Thirty minutes of face to face patient care time were spent during this visit. All questions were encouraged and answered. Folow up in 6 weeks.

## 2017-01-25 ENCOUNTER — Telehealth: Payer: Self-pay

## 2017-01-26 NOTE — Telephone Encounter (Signed)
error 

## 2017-02-02 ENCOUNTER — Telehealth: Payer: Self-pay | Admitting: *Deleted

## 2017-02-02 DIAGNOSIS — M5416 Radiculopathy, lumbar region: Secondary | ICD-10-CM

## 2017-02-02 NOTE — Telephone Encounter (Signed)
[name] is requesting aqua therapy to continue strengthening himself.

## 2017-02-04 NOTE — Telephone Encounter (Signed)
Patient has insurance through [place].  He seemed confused when I mentioned your note stating his lack of insurance being an impediment. Patient says he brought this up a month ago and cannot understand why it still has not been taken care of a month later.  He says Dr. Rolena Infante recommended this aquatic therapy and would like a referral placed to [facility] which has aquatic therapy at their [facility] location.

## 2017-02-04 NOTE — Telephone Encounter (Signed)
Has he completed home therapy? I had discussed outpt vs aquatic therapy when [facility] was done. outpt at neuro-rehab is probably more realistic given that he doesn't have insurance. I can make a referral to [facility] for aquatic therapy but wonder how he is going pay for this??

## 2017-02-06 NOTE — Telephone Encounter (Signed)
What I brought up was that when [facility] therapy was winding down we could transition to outpt therapies. I MENTIONED aquatic therapy at his last visit (look at my note). Sorry I got the insurance incorrect---was going by what is listed on chart.   Therapy order written

## 2017-02-09 ENCOUNTER — Telehealth: Payer: Self-pay | Admitting: *Deleted

## 2017-02-09 NOTE — Telephone Encounter (Signed)
Left message on VM per DPR with information that order has been written and therapy should contact him.

## 2017-02-09 NOTE — Telephone Encounter (Signed)
[name] called and was upset that he didn't get a call back about the water therapy.  I left him the message this morning and I apologized to him that Dr Naaman Plummer addressed the message and he had not been notified.  They have set the therapy visit for Tuesday.

## 2017-02-16 ENCOUNTER — Encounter: Payer: Self-pay | Admitting: Physical Medicine & Rehabilitation

## 2017-02-16 ENCOUNTER — Encounter
Payer: Managed Care, Other (non HMO) | Attending: Physical Medicine & Rehabilitation | Admitting: Physical Medicine & Rehabilitation

## 2017-02-16 VITALS — BP 171/100 | HR 60

## 2017-02-16 DIAGNOSIS — Z5189 Encounter for other specified aftercare: Secondary | ICD-10-CM | POA: Insufficient documentation

## 2017-02-16 DIAGNOSIS — I10 Essential (primary) hypertension: Secondary | ICD-10-CM | POA: Insufficient documentation

## 2017-02-16 DIAGNOSIS — M5416 Radiculopathy, lumbar region: Secondary | ICD-10-CM | POA: Diagnosis present

## 2017-02-16 DIAGNOSIS — R269 Unspecified abnormalities of gait and mobility: Secondary | ICD-10-CM | POA: Diagnosis not present

## 2017-02-16 DIAGNOSIS — R634 Abnormal weight loss: Secondary | ICD-10-CM | POA: Insufficient documentation

## 2017-02-16 DIAGNOSIS — M7989 Other specified soft tissue disorders: Secondary | ICD-10-CM | POA: Insufficient documentation

## 2017-02-16 DIAGNOSIS — M1990 Unspecified osteoarthritis, unspecified site: Secondary | ICD-10-CM | POA: Insufficient documentation

## 2017-02-16 DIAGNOSIS — E119 Type 2 diabetes mellitus without complications: Secondary | ICD-10-CM | POA: Insufficient documentation

## 2017-02-16 DIAGNOSIS — M48061 Spinal stenosis, lumbar region without neurogenic claudication: Secondary | ICD-10-CM | POA: Diagnosis not present

## 2017-02-16 MED ORDER — IRBESARTAN 150 MG PO TABS: 150.0000 mg | ORAL_TABLET | Freq: Every day | ORAL | 1 refills | Status: DC

## 2017-02-16 MED ORDER — IRBESARTAN 150 MG PO TABS: 150.0000 mg | ORAL_TABLET | Freq: Every day | ORAL | 4 refills | Status: DC

## 2017-02-16 NOTE — Patient Instructions (Signed)
PLEASE FEEL FREE TO CALL OUR OFFICE WITH ANY PROBLEMS OR QUESTIONS ([phone])      

## 2017-02-16 NOTE — Progress Notes (Signed)
Subjective:    [name], male    DOB: May 25, 1961, 56 y.o.   MRN: [id number]  HPI   [name] is here in follow up of his lumbar radiculopathy. He is making progress. He is walking with his walker and has completed HH. He had [facility] visit with [facility] this [facility]. He will be doing some aquatic activties therapy.   He is having no pain or using anything for pain.    Pain Inventory Average Pain 0 Pain Right Now 0 My pain is na  In the last 24 hours, has pain interfered with the following? General activity 0 Relation with others 0 Enjoyment of life 0 What TIME of day is your pain at its worst? na Sleep (in general) Good  Pain is worse with: na Pain improves with: na Relief from Meds: na  Mobility use a walker use a wheelchair  Function Do you have any goals in this area?  yes  Neuro/Psych weakness numbness tingling  Prior Studies Any changes since last visit?  no  Physicians involved in your care Any changes since last visit?  no   Family History  Problem Relation Age of Onset  . Diabetes Mother   . Hypertension Mother   . Diabetes Father   . Hypertension Father    Social History   Social History  . Marital status: Married    Spouse name: N/A  . Number of children: N/A  . Years of education: N/A   Social History Main Topics  . Smoking status: Never Smoker  . Smokeless tobacco: Never Used  . Alcohol use No  . Drug use: No  . Sexual activity: Not Asked   Other Topics Concern  . None   Social History Narrative  . None   Past Surgical History:  Procedure Laterality Date  . LAPAROSCOPIC GASTRIC SLEEVE RESECTION    . LUMBAR LAMINECTOMY  12/01/2016   Lumbar decompression L2-5 LUMBAR LAMINECTOMY/DECOMPRESSION MICRODISCECTOMY 3 LEVELS (N/A)  . LUMBAR LAMINECTOMY/DECOMPRESSION MICRODISCECTOMY N/A 12/01/2016   Procedure: Lumbar decompression L2-5 LUMBAR LAMINECTOMY/DECOMPRESSION MICRODISCECTOMY 3 LEVELS;  Surgeon:  Melina Schools, MD;  Location: [facility];  Service: Orthopedics;  Laterality: N/A;   Past Medical History:  Diagnosis Date  . Arthritis   . Diabetes mellitus without complication ([place]) Resolved 2014 after weight loss  . Hyperlipidemia Resolved 2014 after weight loss  . Hypertension   . Weight loss    BP (!) 171/100   Pulse 60   SpO2 98%   Opioid Risk Score:   Fall Risk Score:  `1  Depression screen PHQ 2/9  Depression screen [facility] 2/9 01/05/2017 11/03/2016  Decreased Interest 0 0  Down, Depressed, Hopeless 0 0  PHQ - 2 Score 0 0  Altered sleeping 0 -  Tired, decreased energy 0 -  Change in appetite 0 -  Feeling bad or failure about yourself  0 -  Trouble concentrating 0 -  Moving slowly or fidgety/restless 0 -  Suicidal thoughts 0 -  PHQ-9 Score 0 -  Difficult doing work/chores Not difficult at all -    Review of Systems  Constitutional: Negative.   HENT: Negative.   Eyes: Negative.   Respiratory: Negative.   Cardiovascular: Negative.   Gastrointestinal: Negative.   Endocrine: Negative.   Genitourinary: Negative.   Musculoskeletal: Negative.   Skin: Negative.   Allergic/Immunologic: Negative.   Neurological: Negative.   Hematological: Negative.   Psychiatric/Behavioral: Negative.   All other systems reviewed and are  negative.      Objective:   Physical Exam  HENT: Normocephalic. Atraumatic Eyes: EOM are normal. No discharge.  Cardiovascular: RRR Respiratory: CTA B GI: soft.  Musculoskeletal: tr edema.  Neurological: He is alert and oriented.  Sensation diminished to light touch RLE 1 to 1+/2 without change primarily along L2-4 dermatomes Motor: B/l UE 5/5 proximal to distal LLE: 4-/5 HF, KE, 4/5 ADP/PF--stable RLE: 3 to 3+/5 HF,  3 to 4-/5 KE, 3- 4/5 ADP/PF  Skin: back incision intact.  Psychiatric:  flat but appropriate     Assessment & Plan:  1. Decreased functional mobility secondary to lumbar stenosis with radiculopathy status post decompression  L-2-L5. Back brace when out of bed             -continue HEP             -he has [facility] land/aquatic [facility] at [facility]. He has made noticeable gains on his own. Reasonable goal would be to ambulate with walker 2. Pain Management: improved             -much improved.  3. Hypertension. BP rising slightly  - Aldactone 25 mg daily, HCTZ 25 mg daily, Avapro 300 mg daily, Norvasc 5 mg daily (held).  -resume hctz              -resume norvasc today at 5mg              -continue to monitor bp at home.               5. LE swelling  -HCTZ -elevation, compression stockings  Thirty minutes of face to face patient care time were spent during this visit. All questions were encouraged and answered. Folow up in 2 months.

## 2017-03-22 ENCOUNTER — Encounter: Payer: Self-pay | Admitting: Internal Medicine

## 2017-03-22 ENCOUNTER — Ambulatory Visit (HOSPITAL_COMMUNITY)
Admission: RE | Admit: 2017-03-22 | Discharge: 2017-03-22 | Disposition: A | Discharge status: Home / Self Care | Payer: Managed Care, Other (non HMO) | Source: Ambulatory Visit | Attending: Internal Medicine | Admitting: Internal Medicine

## 2017-03-22 ENCOUNTER — Ambulatory Visit (INDEPENDENT_AMBULATORY_CARE_PROVIDER_SITE_OTHER): Payer: Managed Care, Other (non HMO) | Admitting: Internal Medicine

## 2017-03-22 ENCOUNTER — Other Ambulatory Visit (HOSPITAL_COMMUNITY): Payer: Self-pay | Admitting: Orthopedic Surgery

## 2017-03-22 VITALS — BP 202/107 | HR 99 | Ht 79.0 in | Wt 270.8 lb

## 2017-03-22 DIAGNOSIS — M7989 Other specified soft tissue disorders: Secondary | ICD-10-CM | POA: Insufficient documentation

## 2017-03-22 DIAGNOSIS — M79606 Pain in leg, unspecified: Secondary | ICD-10-CM

## 2017-03-22 DIAGNOSIS — I10 Essential (primary) hypertension: Secondary | ICD-10-CM | POA: Insufficient documentation

## 2017-03-22 DIAGNOSIS — I82412 Acute embolism and thrombosis of left femoral vein: Secondary | ICD-10-CM | POA: Diagnosis not present

## 2017-03-22 MED ORDER — RIVAROXABAN (XARELTO) VTE STARTER PACK (15 & 20 MG): ORAL_TABLET | ORAL | 0 refills | Status: DC

## 2017-03-22 MED ORDER — AMLODIPINE BESYLATE 10 MG PO TABS: 10.0000 mg | ORAL_TABLET | Freq: Every day | ORAL | 5 refills | Status: DC

## 2017-03-22 NOTE — Progress Notes (Signed)
OFFICE CONSULT NOTE  Chief Complaint:  Left leg swelling, new DVT  Primary Care Physician: Forrest Moron, MD  HPI:  [name] is a 56 y.o. male who is being seen today for the evaluation of new DVT at the request of Dr. Rolena Infante. [name] is a pleasant 56 year old male who is a former football Insurance claims handler at [employer]. He recently underwent surgery for spinal stenosis in January and has been slow to recover from that. He's developed worsening swelling in the left leg and underwent venous Doppler today which demonstrated an occlusive thrombus of the left popliteal and femoral vein which appeared to be chronic. It is likely this is acute on chronic and I would favor treatment. In addition his blood pressure is markedly elevated today at 202/107. He reports taking his medications this morning.  PMHx:  Past Medical History:  Diagnosis Date  . Arthritis   . Diabetes mellitus without complication ([place]) Resolved 2014 after weight loss  . Hyperlipidemia Resolved 2014 after weight loss  . Hypertension   . Weight loss     Past Surgical History:  Procedure Laterality Date  . LAPAROSCOPIC GASTRIC SLEEVE RESECTION    . LUMBAR LAMINECTOMY  12/01/2016   Lumbar decompression L2-5 LUMBAR LAMINECTOMY/DECOMPRESSION MICRODISCECTOMY 3 LEVELS (N/A)  . LUMBAR LAMINECTOMY/DECOMPRESSION MICRODISCECTOMY N/A 12/01/2016   Procedure: Lumbar decompression L2-5 LUMBAR LAMINECTOMY/DECOMPRESSION MICRODISCECTOMY 3 LEVELS;  Surgeon: Melina Schools, MD;  Location: [facility];  Service: Orthopedics;  Laterality: N/A;    FAMHx:  Family History  Problem Relation Age of Onset  . Diabetes Mother   . Hypertension Mother   . Diabetes Father   . Hypertension Father     SOCHx:   reports that he has never smoked. He has never used smokeless tobacco. He reports that he does not drink alcohol or use drugs.  ALLERGIES:  Allergies  Allergen Reactions  . No Known Allergies     ROS: Pertinent items  noted in HPI and remainder of comprehensive ROS otherwise negative.  HOME MEDS: Current Outpatient Prescriptions on File Prior to Visit  Medication Sig Dispense Refill  . amLODipine (NORVASC) 5 MG tablet Take 1 tablet (5 mg total) by mouth daily. 30 tablet 0  . aspirin EC 81 MG tablet Take 81 mg by mouth daily.    . cholecalciferol 2000 units TABS Take 1 tablet (2,000 Units total) by mouth daily. 30 tablet 0  . hydrochlorothiazide (HYDRODIURIL) 25 MG tablet Take 1 tablet (25 mg total) by mouth daily. 30 tablet 3  . irbesartan (AVAPRO) 150 MG tablet Take 1 tablet (150 mg total) by mouth daily. 30 tablet 4  . methocarbamol (ROBAXIN) 500 MG tablet Take 1 tablet (500 mg total) by mouth every 6 (six) hours as needed for muscle spasms. 60 tablet 0  . Multiple Vitamins-Minerals (MULTIVITAMIN WITH MINERALS) tablet Take 1 tablet by mouth daily.     No current facility-administered medications on file prior to visit.     LABS/IMAGING: No results found for this or any previous visit (from the past 48 hour(s)). No results found.  LIPID PANEL:    Component Value Date/Time   CHOL 149 07/16/2014 0535   TRIG 59 07/16/2014 0535   HDL 77 07/16/2014 0535   CHOLHDL 1.9 07/16/2014 0535   VLDL 12 07/16/2014 0535   LDLCALC 60 07/16/2014 0535    WEIGHTS: Wt Readings from Last 3 Encounters:  03/22/17 270 lb 12.8 oz (122.8 kg)  12/22/16 259 lb 0.7 oz (  117.5 kg)  11/25/16 270 lb 11.2 oz (122.8 kg)    VITALS: BP (!) 202/107   Pulse 99   Ht 6\' 7"  (2.007 m)   Wt 270 lb 12.8 oz (122.8 kg)   BMI 30.51 kg/m   EXAM: General appearance: alert, no distress and very tall  and uses a walker to ambulate Lungs: clear to auscultation bilaterally Heart: regular rate and rhythm Extremities: Left left (especially thigh) swollen larger than the right Pulses: 2+ and symmetric Skin: Skin color, texture, turgor normal. No rashes or lesions Neurologic: Grossly normal Psych:  Pleasant  EKG: Deferred  ASSESSMENT: 1. 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 Uncontrolled hypertension  PLAN: 1.   Mr. Rosana Hoes has what is likely subacute left femoral and popliteal deep pain thrombosis which appeared somewhat chronic on imaging today however is new onset since January. I would recommend anticoagulation as this could likely resolve and improve his edema. We will start with Xarelto. Samples were provided along with co-pay assistance card and prescription today. In addition his blood pressure is poorly controlled. He said his medications were decreased during the hospitalization due to hypotension. I think he would benefit from increases in his medications today, namely will double the dose of amlodipine from 5 mg to 10 mg daily. We will likely need to further titrate his medications and arrange for follow-up in hypertension specialty clinic.  Thanks for the kind referral. Follow-up with me in 6 months.  Pixie Casino, MD, [place]  Attending Cardiologist  Direct Dial: [phone]  Fax: [phone]  Website:  [email or web address] 03/22/2017, 5:26 PM

## 2017-03-22 NOTE — Patient Instructions (Addendum)
Your physician has recommended you make the following change in your medication:  -- INCREASE amlodipine to 10mg  daily -- START xarelto 15mg  twice daily - take this for 21 days or 3 weeks  -- then START xarelto 20mg  daily -- STOP aspirin  Dr. Debara Pickett recommends a LEFT THIGH HIGH COMPRESSION STOCKING -- 20-68mmHg  Dr. Debara Pickett has requested that you schedule an appointment with one of our clinical pharmacists for a blood pressure check appointment within the next 2-3 weeks.  -- if you monitor your blood pressure (BP) at home, please bring your BP cuff and your BP readings with you to this appointment -- please check your BP no more than twice daily, after you have been sitting/resting for 5-10 minutes, at least 1 hour after taking your BP medications  Your physician wants you to follow-up in: 6 months with Dr. Debara Pickett. You will receive a reminder letter in the mail two months in advance. If you don't receive a letter, please call our office to schedule the follow-up appointment.

## 2017-04-06 ENCOUNTER — Ambulatory Visit (INDEPENDENT_AMBULATORY_CARE_PROVIDER_SITE_OTHER): Payer: Managed Care, Other (non HMO) | Admitting: Pharmacist Clinician (PhC)/ Clinical Pharmacy Specialist

## 2017-04-06 ENCOUNTER — Encounter: Payer: Self-pay | Admitting: Pharmacist Clinician (PhC)/ Clinical Pharmacy Specialist

## 2017-04-06 DIAGNOSIS — I10 Essential (primary) hypertension: Secondary | ICD-10-CM

## 2017-04-06 MED ORDER — IRBESARTAN 300 MG PO TABS: 300.0000 mg | ORAL_TABLET | Freq: Every day | ORAL | 6 refills | Status: DC

## 2017-04-06 NOTE — Patient Instructions (Signed)
Return for a a follow up appointment in 6 weeks  Your blood pressure today is 146/84  (goal is < 130/80)  Check your blood pressure at home several days each week and keep record of the readings.  Take your BP meds as follows:  Increase irbesartan to 300 mg daily (take 2 of the 150 mg tabs until gone)  Continue with amlodipine and hydrochlorothiazide.   If you are able, switch either the irbesartan or amlodipine to evenings  Bring all of your meds, your BP cuff and your record of home blood pressures to your next appointment.  Exercise as you're able, try to walk approximately 30 minutes per day.  Keep salt intake to a minimum, especially watch canned and prepared boxed foods.  Eat more fresh fruits and vegetables and fewer canned items.  Avoid eating in fast food restaurants.    HOW TO TAKE YOUR BLOOD PRESSURE: . Rest 5 minutes before taking your blood pressure. .  Don't smoke or drink caffeinated beverages for at least 30 minutes before. . Take your blood pressure before (not after) you eat. . Sit comfortably with your back supported and both feet on the floor (don't cross your legs). . Elevate your arm to heart level on a table or a desk. . Use the proper sized cuff. It should fit smoothly and snugly around your bare upper arm. There should be enough room to slip a fingertip under the cuff. The bottom edge of the cuff should be 1 inch above the crease of the elbow. . Ideally, take 3 measurements at one sitting and record the average.

## 2017-04-06 NOTE — Assessment & Plan Note (Signed)
Patient with long history of hypertension, doing better but not yet at goal.  He does take all medications in the mornings.  Will have him increase the irbesartan to 300 mg daily and also encouraged him to move that or the amlodipine to evenings.  If he is unable to remember taking evening med, he can switch back to mornings, but did explain the decrease in BP for most patients when medications are divided.  Will see him back in 6 weeks for follow up.

## 2017-04-06 NOTE — Progress Notes (Signed)
04/06/2017 [name] 1961/05/20 [id number]   HPI:  [name] is a 56 y.o. male patient of Dr Debara Pickett, with a PMH below who presents today for hypertension clinic evaluation.  He was actually seen in our office just 2 weeks ago for a newly diagnosed acute on chronic DVT after spinal surgery in January.  His pressure that day was 202/107.  Dr. Debara Pickett increased his amlodipine from 5 mg to 10 mg daily.  He also has hyperlipidemia (last LDL 60 in 2015) and diabetes (controlled with weight loss, last A1c at 5.3 down from high of 14.1).   He states he has had hypertension for 10-20 years and it often has run high, despite various medications.  Blood Pressure Goal:  130/80  Current Medications:  Amlodipine 10 mg (am)  HCTZ 25 mg qd (am)  Irbesartan 150 mg qd (am)  Current Labs:  11/2016:  Na 140, K 3.8, BUN 22, SCr 0.99, GFR >60, Glu 118  Family Hx:  Mother with hypertension, DM, died in her 50's  Patient did not know his father  2 of 3 siblings with hypertension  Daughter (age 38) with hypertension  Social Hx:  No tobacco, some social alcohol; coffee up to 4 cups per day, only rare soda  Diet:  Mix of home cooked as well as eating out.  Restaurants include both fast food and sit down style; does not add salt at the table  Exercise:  Therapy 3 days per week for back  Home BP readings:  Not checking, does have home cuff  Intolerances:   none  Wt Readings from Last 3 Encounters:  03/22/17 270 lb 12.8 oz (122.8 kg)  12/22/16 259 lb 0.7 oz (117.5 kg)  11/25/16 270 lb 11.2 oz (122.8 kg)   BP Readings from Last 3 Encounters:  03/22/17 (!) 202/107  02/16/17 (!) 171/100  01/05/17 (!) 165/93   Pulse Readings from Last 3 Encounters:  03/22/17 99  02/16/17 60  01/05/17 (!) 51    Current Outpatient Prescriptions  Medication Sig Dispense Refill  . amLODipine (NORVASC) 10 MG tablet Take 1 tablet (10 mg total) by mouth daily. 30 tablet 5  . cholecalciferol 2000 units TABS  Take 1 tablet (2,000 Units total) by mouth daily. 30 tablet 0  . hydrochlorothiazide (HYDRODIURIL) 25 MG tablet Take 1 tablet (25 mg total) by mouth daily. 30 tablet 3  . irbesartan (AVAPRO) 150 MG tablet Take 1 tablet (150 mg total) by mouth daily. 30 tablet 4  . methocarbamol (ROBAXIN) 500 MG tablet Take 1 tablet (500 mg total) by mouth every 6 (six) hours as needed for muscle spasms. 60 tablet 0  . Multiple Vitamins-Minerals (MULTIVITAMIN WITH MINERALS) tablet Take 1 tablet by mouth daily.    . Rivaroxaban 15 & 20 MG TBPK Take as directed on package: Start with one 15mg  tablet by mouth twice a day with food. On Day 22, switch to one 20mg  tablet once a day with food. 51 each 0   No current facility-administered medications for this visit.     Allergies  Allergen Reactions  . No Known Allergies     Past Medical History:  Diagnosis Date  . Arthritis   . Diabetes mellitus without complication ([place]) Resolved 2014 after weight loss  . Hyperlipidemia Resolved 2014 after weight loss  . Hypertension   . Weight loss      There were no vitals taken for this visit. Standing 140/92  No problem-specific Assessment &  Plan notes found for this encounter.   Tommy Medal PharmD CPP [facility]

## 2017-04-15 ENCOUNTER — Other Ambulatory Visit: Payer: Self-pay | Admitting: Internal Medicine

## 2017-04-15 MED ORDER — RIVAROXABAN 20 MG PO TABS: 20.0000 mg | ORAL_TABLET | Freq: Every day | ORAL | 5 refills | Status: DC

## 2017-04-19 ENCOUNTER — Encounter: Payer: Self-pay | Admitting: Physical Medicine & Rehabilitation

## 2017-04-19 ENCOUNTER — Encounter
Payer: Managed Care, Other (non HMO) | Attending: Physical Medicine & Rehabilitation | Admitting: Physical Medicine & Rehabilitation

## 2017-04-19 VITALS — BP 165/93 | HR 73

## 2017-04-19 DIAGNOSIS — M1990 Unspecified osteoarthritis, unspecified site: Secondary | ICD-10-CM | POA: Insufficient documentation

## 2017-04-19 DIAGNOSIS — E119 Type 2 diabetes mellitus without complications: Secondary | ICD-10-CM | POA: Insufficient documentation

## 2017-04-19 DIAGNOSIS — M7989 Other specified soft tissue disorders: Secondary | ICD-10-CM | POA: Insufficient documentation

## 2017-04-19 DIAGNOSIS — Z5189 Encounter for other specified aftercare: Secondary | ICD-10-CM | POA: Diagnosis not present

## 2017-04-19 DIAGNOSIS — I10 Essential (primary) hypertension: Secondary | ICD-10-CM | POA: Insufficient documentation

## 2017-04-19 DIAGNOSIS — R634 Abnormal weight loss: Secondary | ICD-10-CM | POA: Insufficient documentation

## 2017-04-19 DIAGNOSIS — R269 Unspecified abnormalities of gait and mobility: Secondary | ICD-10-CM | POA: Insufficient documentation

## 2017-04-19 DIAGNOSIS — M5416 Radiculopathy, lumbar region: Secondary | ICD-10-CM

## 2017-04-19 DIAGNOSIS — M48061 Spinal stenosis, lumbar region without neurogenic claudication: Secondary | ICD-10-CM | POA: Insufficient documentation

## 2017-04-19 NOTE — Progress Notes (Signed)
Subjective:    [name], male    DOB: 1961-10-19, 56 y.o.   MRN: [id number]  HPI   Mr. Guillet is here in follow up of his chronic radiculopathy. He is still working with therapy and is now on the walker. Therapy has been changed to 2x per week.  He tested positive for LLE DVT. He is now on xarelto. His swelling has improved. He saw an internist to improve his bp which has improved.   Pain Inventory Average Pain 0 Pain Right Now 0 My pain is na  In the last 24 hours, has pain interfered with the following? General activity 0 Relation with others 0 Enjoyment of life 0 What TIME of day is your pain at its worst? na Sleep (in general) Fair  Pain is worse with: na Pain improves with: na Relief from Meds: n  Mobility use a walker  Function Do you have any goals in this area?  yes  Neuro/Psych No problems in this area  Prior Studies Any changes since last visit?  no  Physicians involved in your care Any changes since last visit?  no   Family History  Problem Relation Age of Onset  . Diabetes Mother   . Hypertension Mother   . Diabetes Father   . Hypertension Father    Social History   Social History  . Marital status: Married    Spouse name: N/A  . Number of children: N/A  . Years of education: N/A   Social History Main Topics  . Smoking status: Never Smoker  . Smokeless tobacco: Never Used  . Alcohol use No  . Drug use: No  . Sexual activity: Not on file   Other Topics Concern  . Not on file   Social History Narrative  . No narrative on file   Past Surgical History:  Procedure Laterality Date  . LAPAROSCOPIC GASTRIC SLEEVE RESECTION    . LUMBAR LAMINECTOMY  12/01/2016   Lumbar decompression L2-5 LUMBAR LAMINECTOMY/DECOMPRESSION MICRODISCECTOMY 3 LEVELS (N/A)  . LUMBAR LAMINECTOMY/DECOMPRESSION MICRODISCECTOMY N/A 12/01/2016   Procedure: Lumbar decompression L2-5 LUMBAR LAMINECTOMY/DECOMPRESSION MICRODISCECTOMY 3 LEVELS;  Surgeon:  Melina Schools, MD;  Location: [facility];  Service: Orthopedics;  Laterality: N/A;   Past Medical History:  Diagnosis Date  . Arthritis   . Diabetes mellitus without complication ([place]) Resolved 2014 after weight loss  . Hyperlipidemia Resolved 2014 after weight loss  . Hypertension   . Weight loss    There were no vitals taken for this visit.  Opioid Risk Score:   Fall Risk Score:  `1  Depression screen PHQ 2/9  Depression screen [facility] 2/9 01/05/2017 11/03/2016  Decreased Interest 0 0  Down, Depressed, Hopeless 0 0  PHQ - 2 Score 0 0  Altered sleeping 0 -  Tired, decreased energy 0 -  Change in appetite 0 -  Feeling bad or failure about yourself  0 -  Trouble concentrating 0 -  Moving slowly or fidgety/restless 0 -  Suicidal thoughts 0 -  PHQ-9 Score 0 -  Difficult doing work/chores Not difficult at all -    Review of Systems  Constitutional: Negative.   HENT: Negative.   Eyes: Negative.   Respiratory: Negative.   Cardiovascular: Negative.   Gastrointestinal: Negative.   Endocrine: Negative.   Genitourinary: Negative.   Musculoskeletal: Negative.   Skin: Negative.   Allergic/Immunologic: Negative.   Neurological: Negative.   Hematological: Negative.   Psychiatric/Behavioral: Negative.   All other systems reviewed  and are negative.      Objective:   Physical Exam  HENT: Normocephalic. Atraumatic Eyes: EOM are normal. No discharge.  Cardiovascular: RRR Respiratory: CTA B GI: soft.  Musculoskeletal: tr edema.  Neurological: He is alert and oriented.  Sensation diminished to light touch RLE along upper leg, senses gross touch Motor: B/l UE 5/5 proximal to distal LLE: 4+/5 HF, KE, 4+/5 ADP/PF  RLE: 4-/5 HF,   4-/5 KE,  4/5 ADP/PF  Skin: back incision intact.  Psychiatric: flat but appropriate   Assessment & Plan:  1. Decreased functional mobility secondary to lumbar stenosis with radiculopathy status post decompression L-2-L5. Back brace when out of  bed -continue HEP and outpt therapies.  -REVIEWED importance of engaging hip and quad muscles on right leg- his strength and sensation are improving. He must work through some old habits and confidence issues too. 2. Pain Management: improved -much improved.  3. Hypertension. Per primary             -   HCTZ 25 mg daily, Avapro 300 mg daily, Norvasc 10 mg daily       4. LLE DVT---xarelto  15 minutes of face to face patient care time were spent during this visit. All questions were encouraged and answered. Folow up here PRN.

## 2017-04-19 NOTE — Patient Instructions (Signed)
CONTINUE TO WORK ON YOUR POSTURE AND MOBILITY. UTILIZE YOUR RIGHT HIP AND KNEE MUSCLES AS MUCH AS POSSIBLE---AVOID USING YOUR ARMS.

## 2017-04-25 ENCOUNTER — Other Ambulatory Visit: Payer: Self-pay | Admitting: Pharmacist Clinician (PhC)/ Clinical Pharmacy Specialist

## 2017-04-25 ENCOUNTER — Other Ambulatory Visit: Payer: Self-pay | Admitting: Internal Medicine

## 2017-04-25 MED ORDER — RIVAROXABAN 20 MG PO TABS: 20.0000 mg | ORAL_TABLET | Freq: Every day | ORAL | 5 refills | Status: DC

## 2017-05-11 ENCOUNTER — Ambulatory Visit (INDEPENDENT_AMBULATORY_CARE_PROVIDER_SITE_OTHER): Payer: Managed Care, Other (non HMO) | Admitting: Pharmacist Clinician (PhC)/ Clinical Pharmacy Specialist

## 2017-05-11 VITALS — BP 138/82 | HR 76

## 2017-05-11 DIAGNOSIS — I10 Essential (primary) hypertension: Secondary | ICD-10-CM

## 2017-05-11 LAB — BASIC METABOLIC PANEL
BUN / CREAT RATIO: 20 (ref 9–20)
BUN: 18 mg/dL (ref 6–24)
CO2: 27 mmol/L (ref 20–29)
CREATININE: 0.9 mg/dL (ref 0.76–1.27)
Calcium: 9.7 mg/dL (ref 8.7–10.2)
Chloride: 101 mmol/L (ref 96–106)
GFR calc Af Amer: 109 mL/min/{1.73_m2} (ref >59)
GFR calc non Af Amer: 94 mL/min/{1.73_m2} (ref >59)
GLUCOSE: 86 mg/dL (ref 65–99)
Potassium: 3.7 mmol/L (ref 3.5–5.2)
SODIUM: 143 mmol/L (ref 134–144)

## 2017-05-11 NOTE — Assessment & Plan Note (Signed)
Patient seems to be well controlled on his current regimen.  While his reading was higher in the office than what he usually sees at home, he states that there is some "white coat" hypertension, and he has always been higher in the office.  He is to continue with his current regimen and home BP checks.  I asked that he take his home cuff to his next MD visit (with any MD) to have the cuff accuracy verified. In the meantime he is to contact us should he note an increase in the average home readings.  Will have him get BMET today, as his last was in January.

## 2017-05-11 NOTE — Patient Instructions (Signed)
Go to the lab today for blood work to check your kidney function and electrolytes  Your blood pressure today is 138/82  (goal is < 130/80)  Check your blood pressure at home 3-4 times per week and keep record of the readings.  Take your BP meds as follows:  Continue with all current medications  Bring all of your meds, your BP cuff and your record of home blood pressures to your next appointment.  Exercise as you're able, try to walk approximately 30 minutes per day.  Keep salt intake to a minimum, especially watch canned and prepared boxed foods.  Eat more fresh fruits and vegetables and fewer canned items.  Avoid eating in fast food restaurants.    HOW TO TAKE YOUR BLOOD PRESSURE: . Rest 5 minutes before taking your blood pressure. .  Don't smoke or drink caffeinated beverages for at least 30 minutes before. . Take your blood pressure before (not after) you eat. . Sit comfortably with your back supported and both feet on the floor (don't cross your legs). . Elevate your arm to heart level on a table or a desk. . Use the proper sized cuff. It should fit smoothly and snugly around your bare upper arm. There should be enough room to slip a fingertip under the cuff. The bottom edge of the cuff should be 1 inch above the crease of the elbow. . Ideally, take 3 measurements at one sitting and record the average.

## 2017-05-11 NOTE — Progress Notes (Signed)
05/11/2017 [name] [birth year] [id number]   HPI:  [name] is a 56 y.o. male patient of Dr Debara Pickett, with a PMH below who presents today for hypertension clinic evaluation.  He was originally seen in our office for a newly diagnosed acute on chronic DVT after spinal surgery in January.  His pressure that day was 202/107.  Dr. Debara Pickett increased his amlodipine from 5 mg to 10 mg daily.  He also has hyperlipidemia (last LDL 60 in 2015) and diabetes (controlled with weight loss, last A1c at 5.3 down from high of 14.1).   He states he has had hypertension for 10-20 years and it often has run high, despite various medications.  At his last visit with me the irbesartan was increased from 150 mg to 300 mg daily and he was asked to separate the amlodipine and irbesartan by 6-10 hours.  He has done this and notes a significant drop in his home blood pressure readings.    He notes some occasional lower extremity edema, but states it disappears each night.   No change in other medications or diet since last visit.   Blood Pressure Goal:  130/80  Current Medications:  Amlodipine 10 mg am  HCTZ 25 mg qd am  Irbesartan 300 mg pm  Current Labs:  11/2016:  Na 140, K 3.8, BUN 22, SCr 0.99, GFR >60, Glu 118  Family Hx:  Mother with hypertension, DM, died in her 26's  Patient did not know his father  2 of 3 siblings with hypertension  Daughter (age 53) with hypertension  Social Hx:  No tobacco, some social alcohol; coffee up to 4 cups per day, only rare soda  Diet:  Mix of home cooked as well as eating out.  Restaurants include both fast food and sit down style; does not add salt at the table  Exercise:  Therapy 3 days per week for back (2 at home, 1 with therapist)  Home BP readings:  Home cuff < 95 years old, arm cuff from [employer].  Has 34 readings since last visit, average 130/81.  Of note the last 20 days, all readings were < 423 systolic, with diastolic ranging from 53-61.  He has not  brought his cuff in for verification.    Intolerances:   none  Wt Readings from Last 3 Encounters:  03/22/17 270 lb 12.8 oz (122.8 kg)  12/22/16 259 lb 0.7 oz (117.5 kg)  11/25/16 270 lb 11.2 oz (122.8 kg)   BP Readings from Last 3 Encounters:  05/11/17 138/82  04/19/17 (!) 165/93  04/06/17 (!) 146/84   Pulse Readings from Last 3 Encounters:  05/11/17 76  04/19/17 73  03/22/17 99    Current Outpatient Prescriptions  Medication Sig Dispense Refill  . amLODipine (NORVASC) 10 MG tablet Take 1 tablet (10 mg total) by mouth daily. 30 tablet 5  . cholecalciferol 2000 units TABS Take 1 tablet (2,000 Units total) by mouth daily. 30 tablet 0  . hydrochlorothiazide (HYDRODIURIL) 25 MG tablet Take 1 tablet (25 mg total) by mouth daily. 30 tablet 3  . irbesartan (AVAPRO) 300 MG tablet Take 1 tablet (300 mg total) by mouth daily. 30 tablet 6  . methocarbamol (ROBAXIN) 500 MG tablet Take 1 tablet (500 mg total) by mouth every 6 (six) hours as needed for muscle spasms. 60 tablet 0  . Multiple Vitamins-Minerals (MULTIVITAMIN WITH MINERALS) tablet Take 1 tablet by mouth daily.    . rivaroxaban (XARELTO) 20 MG TABS  tablet Take 1 tablet (20 mg total) by mouth daily with supper. 30 tablet 5   No current facility-administered medications for this visit.     Allergies  Allergen Reactions  . No Known Allergies     Past Medical History:  Diagnosis Date  . Arthritis   . Diabetes mellitus without complication ([place]) Resolved 2014 after weight loss  . Hyperlipidemia Resolved 2014 after weight loss  . Hypertension   . Weight loss      Blood pressure 138/82, pulse 76.   Essential hypertension Patient seems to be well controlled on his current regimen.  While his reading was higher in the office than what he usually sees at home, he states that there is some "white coat" hypertension, and he has always been higher in the office.  He is to continue with his current regimen and home BP checks.  I  asked that he take his home cuff to his next MD visit (with any MD) to have the cuff accuracy verified. In the meantime he is to contact us should he note an increase in the average home readings.  Will have him get BMET today, as his last was in January.     Tommy Medal PharmD CPP [facility]

## 2017-05-18 ENCOUNTER — Other Ambulatory Visit: Payer: Self-pay | Admitting: Internal Medicine

## 2017-05-18 ENCOUNTER — Other Ambulatory Visit: Payer: Self-pay | Admitting: Physical Medicine & Rehabilitation

## 2017-05-18 DIAGNOSIS — I10 Essential (primary) hypertension: Secondary | ICD-10-CM

## 2017-10-11 ENCOUNTER — Ambulatory Visit: Payer: Managed Care, Other (non HMO) | Admitting: Internal Medicine

## 2017-11-01 ENCOUNTER — Ambulatory Visit: Payer: Self-pay | Admitting: Internal Medicine

## 2017-12-17 IMAGING — MR MR LUMBAR SPINE W/O CM
4 of 5 series · 17 of 48 positions shown · non-contrast
Comparison: 10/18/2016

CLINICAL DATA: Bilateral lower extremity weakness. Lumbar
decompression earlier today.

EXAM:
MRI LUMBAR SPINE WITHOUT CONTRAST
TECHNIQUE: Multiplanar, multisequence MR imaging of the lumbar spine was
performed. No intravenous contrast was administered.

[Series 2: T2 · sagittal · 4.5mm · 0.66mm/px · 4 of 17 slices shown (1 of 2)]
[im 1/17]
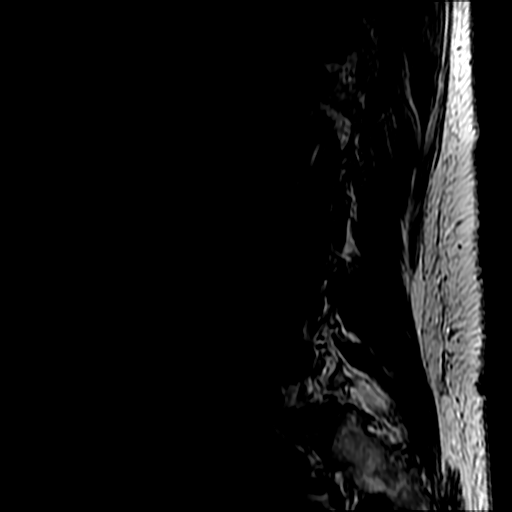
[im 6/17]
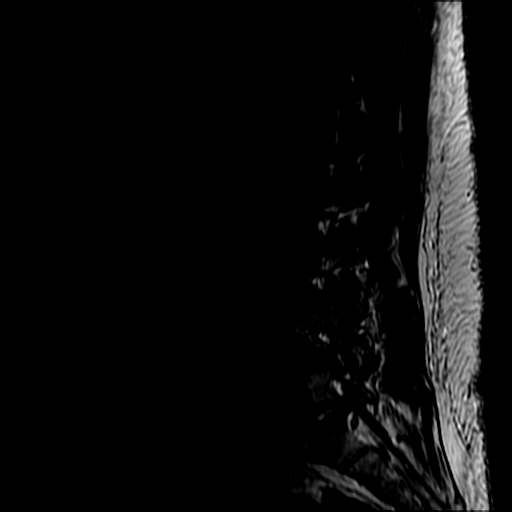
[im 11/17]
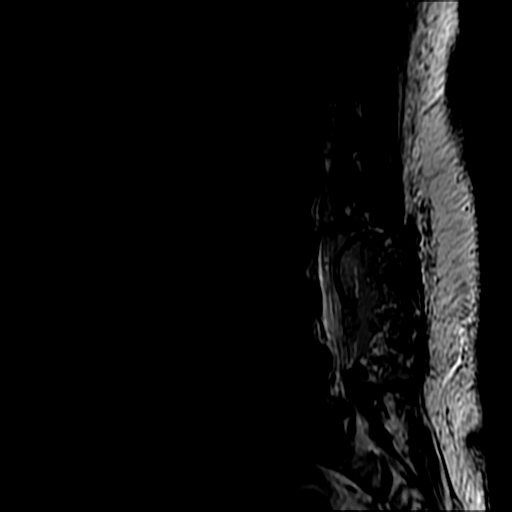
[im 17/17]
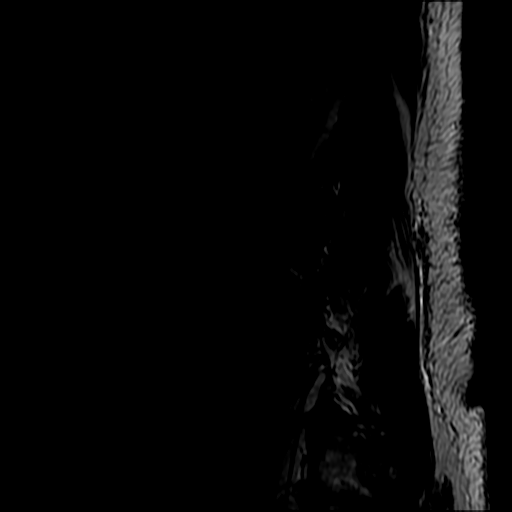

[Series 3: T2 · axial · 4.0mm · 0.39mm/px · z∈[-56,+198]mm · 7 of 63 slices shown (2 of 2)]
[im 4/63]
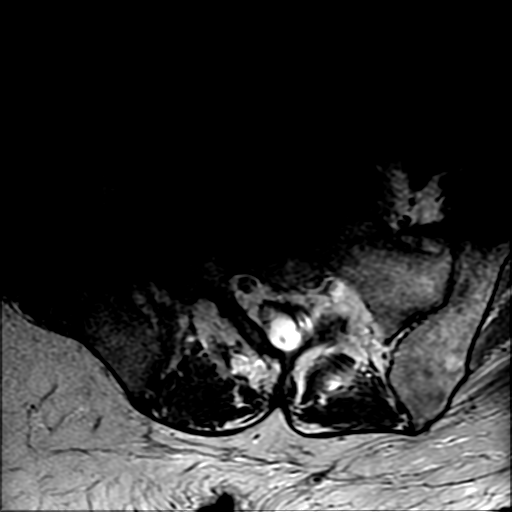
[im 8/63]
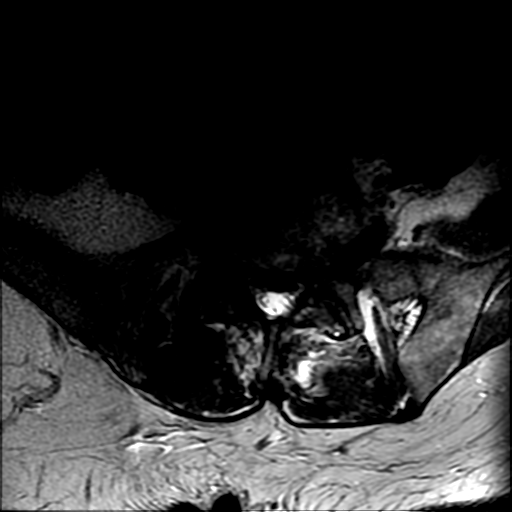
[im 12/63]
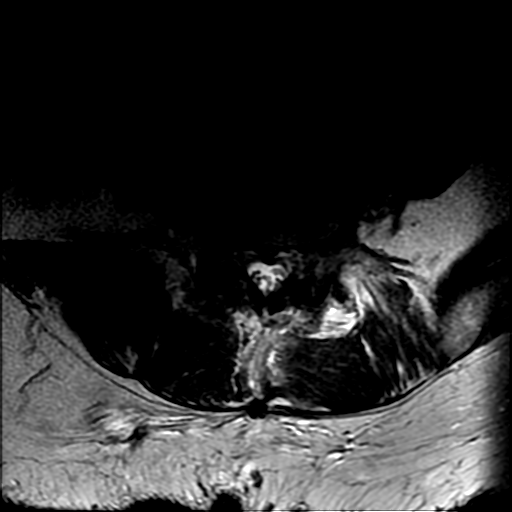
[im 20/63]
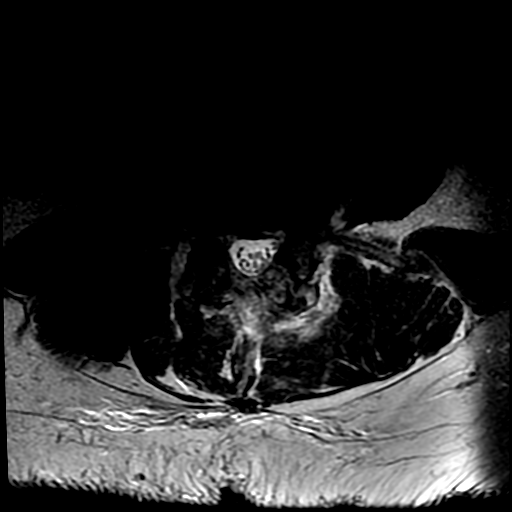
[im 28/63]
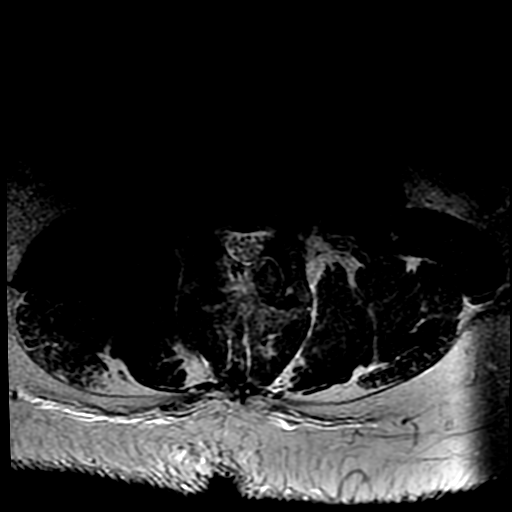
[im 32/63]
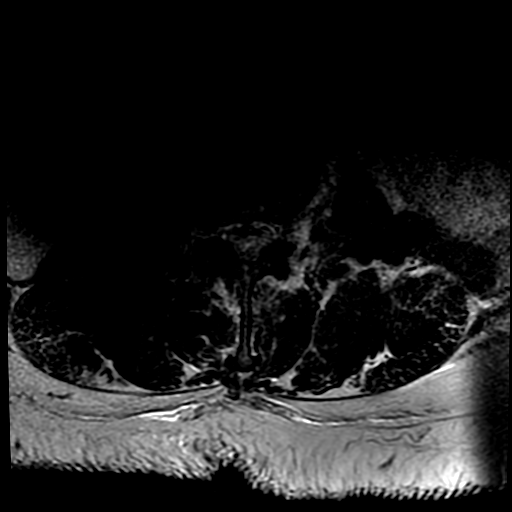
[im 55/63]
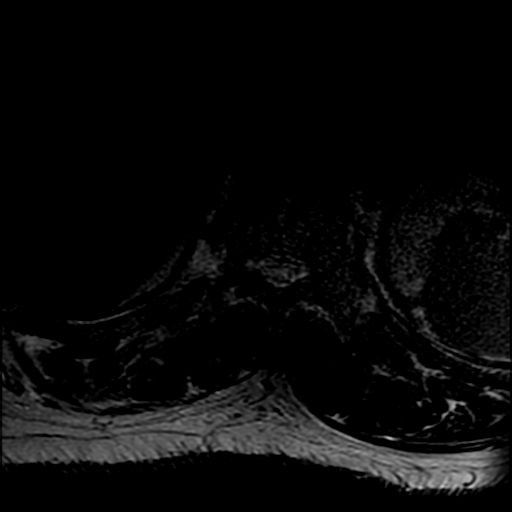

[Series 4: T1 · axial · 4.0mm · 0.39mm/px · z∈[-36,+198]mm · 3 of 63 slices shown (1 of 2)]
[im 8/63]
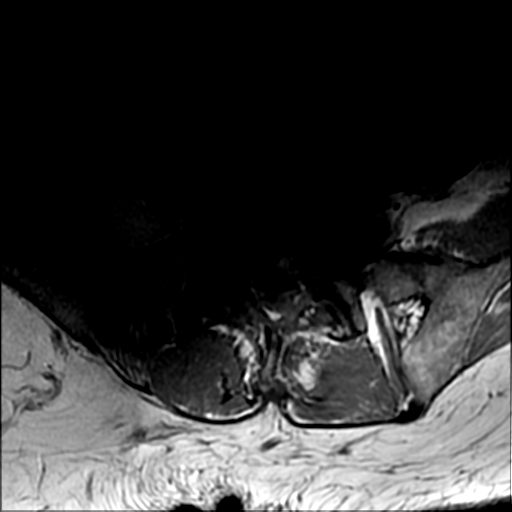
[im 32/63]
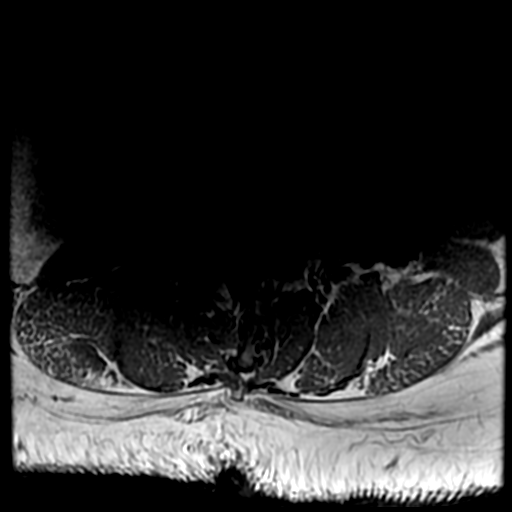
[im 55/63]
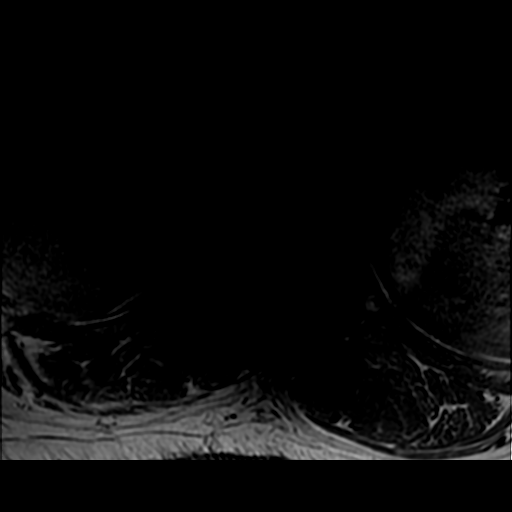

[Series 4: T1 · sagittal · 4.5mm · 0.66mm/px · 3 of 18 slices shown (2 of 2)]
[im 1/18]
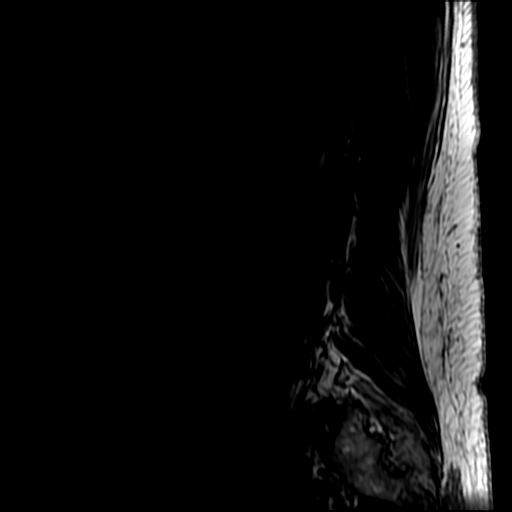
[im 9/18]
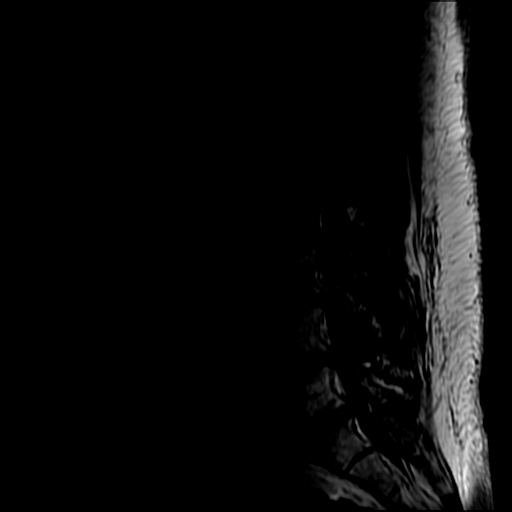
[im 18/18]
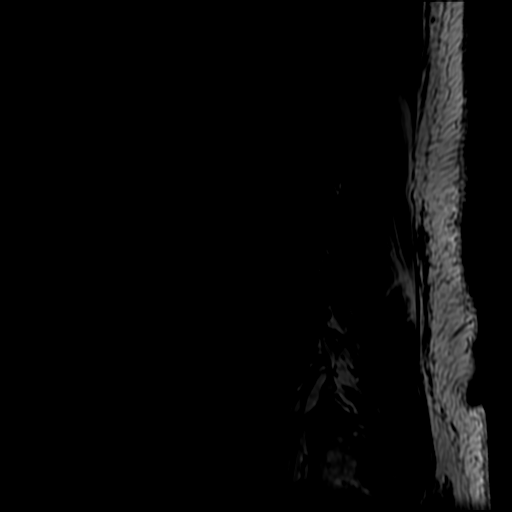

[17 of 48 positions shown; findings below may reference images not displayed]

FINDINGS: The patient had difficulty tolerating the examination. The study is
motion degraded, and there is decreased signal through the lower
thoracic spine. The axial sequences do not cross reference to the
sagittal sequences.

Segmentation:  Standard.

Alignment:  Unchanged.  No listhesis.

Vertebrae: Preserved vertebral body heights without evidence of
fracture or suspicious osseous lesion. Mild degenerative endplate
edema at L5-S1. Congenitally short pedicles.

Conus medullaris: Extends to the L2 level. Sagittal T2 and STIR
sequences demonstrate new abnormal hyperintense signal abnormality
within the distal cord extending from the L1 level superiorly into
the visualized lower thoracic spine. Axial T2 sequences are motion
degraded, however some images clearly demonstrate T2 hyperintensity
involving much of the cross-section of the cord, greatest centrally
(for example series 3 images 5 and 17).

Paraspinal and other soft tissues: Postoperative changes in the
posterior soft tissues from L2-L5 with a surgical drain in place.
Fluid is present in the surgical bed without a compressive epidural
collection identified. T2 hyperintense lesions are again noted in
left kidney, likely cysts.

Disc levels:

T10-11: Minimal disc bulging, congenitally short pedicles, and facet
hypertrophy result in mild spinal stenosis. The neural foramina are
not well evaluated.

T11-12: Minimal disc bulging, congenitally short pedicles, and
moderate facet and ligamentum flavum hypertrophy result in
mild-to-moderate spinal stenosis and mild-to-moderate bilateral
neural foraminal stenosis.

T12-L1: Congenitally short pedicles and facet and ligamentum flavum
hypertrophy result in mild spinal stenosis. No significant neural
foraminal stenosis.

L1-2: The possible left paracentral/subarticular disc fragment
described on the prior study is less well seen on the current
examination due to study quality though is likely still present.
Circumferential disc bulging, congenitally short pedicles, and facet
and ligamentum flavum hypertrophy result in mild spinal stenosis,
mild right and moderate left lateral recess stenosis, and mild
bilateral neural foraminal stenosis, similar to prior.

L2-3: Interval laminectomies with mild residual spinal stenosis,
improved from prior. Disc bulging and facet hypertrophy result in
mild bilateral neural foraminal stenosis, unchanged.

L3-4: Interval laminectomies without residual spinal stenosis. Disc
bulging and facet hypertrophy result in mild right and
mild-to-moderate left neural foraminal stenosis, unchanged.

L4-5: Interval laminectomies without residual spinal stenosis. Disc
bulging and facet hypertrophy result in mild-to-moderate bilateral
neural foraminal stenosis, unchanged.

L5-S1: Moderate disc space narrowing with disc desiccation. Interval
left laminotomy. Circumferential disc bulging and facet hypertrophy
result in moderate right and mild residual left lateral recess
stenosis and moderate right greater than left neural foraminal
stenosis.
IMPRESSION: 1. New T2 edema throughout the distal spinal cord extending from L1
into the visualized lower thoracic spine. No high-grade compressive
spinal stenosis is identified, and this may reflect ischemia from a
vascular insult.
2. Interval L2-L5 posterior decompression as above.
3. Mild-to-moderate congenital and acquired spinal stenosis from
T10-L2.

Findings discussed via telephone with Dr. Guiheux on 12/01/2016 at
[DATE] p.m.

## 2017-12-23 ENCOUNTER — Ambulatory Visit: Payer: Self-pay | Admitting: Internal Medicine

## 2018-01-03 DIAGNOSIS — M545 Low back pain, unspecified: Secondary | ICD-10-CM | POA: Insufficient documentation

## 2018-03-15 ENCOUNTER — Ambulatory Visit: Payer: Self-pay | Admitting: Internal Medicine

## 2018-04-13 ENCOUNTER — Ambulatory Visit: Payer: Self-pay | Admitting: Internal Medicine

## 2018-06-20 ENCOUNTER — Other Ambulatory Visit: Payer: Self-pay | Admitting: Internal Medicine

## 2018-06-20 DIAGNOSIS — I10 Essential (primary) hypertension: Secondary | ICD-10-CM

## 2018-07-11 ENCOUNTER — Encounter: Payer: Self-pay | Admitting: Internal Medicine

## 2018-07-11 ENCOUNTER — Ambulatory Visit: Payer: BLUE CROSS/BLUE SHIELD | Admitting: Internal Medicine

## 2018-07-11 VITALS — BP 150/90 | HR 79 | Ht 79.0 in | Wt 289.8 lb

## 2018-07-11 DIAGNOSIS — I10 Essential (primary) hypertension: Secondary | ICD-10-CM | POA: Diagnosis not present

## 2018-07-11 DIAGNOSIS — I824Z2 Acute embolism and thrombosis of unspecified deep veins of left distal lower extremity: Secondary | ICD-10-CM

## 2018-07-11 NOTE — Patient Instructions (Signed)
Your physician has requested that you have a left lower venous duplex. This test is an ultrasound of the veins in the legs. It looks at venous blood flow that carries blood from the heart to the legs. Allow one hour for a Lower Venous exam. There are no restrictions or special instructions.  Dr. Debara Pickett has requested that you schedule an appointment with one of our clinical pharmacists for a blood pressure check appointment in 4 weeks.  If you monitor your blood pressure (BP) at home, please bring your BP cuff and your BP readings with you to this appointment  HOW TO TAKE YOUR BLOOD PRESSURE:  Rest 5 minutes before taking your blood pressure.  Don't smoke or drink caffeinated beverages for at least 30 minutes before.  Take your blood pressure before (not after) you eat.  Sit comfortably with your back supported and both feet on the floor (don't cross your legs).  Elevate your arm to heart level on a table or a desk.  Use the proper sized cuff. It should fit smoothly and snugly around your bare upper arm. There should be enough room to slip a fingertip under the cuff. The bottom edge of the cuff should be 1 inch above the crease of the elbow.  Ideally, take 3 measurements at one sitting and record the average.   Your physician recommends that you schedule a follow-up appointment as needed with Dr. Debara Pickett.

## 2018-07-11 NOTE — Progress Notes (Signed)
OFFICE CONSULT NOTE  Chief Complaint:  Follow-up DVT  Primary Care Physician: Forrest Moron, MD  HPI:  [name] is a 57 y.o. male who is being seen today for the evaluation of new DVT at the request of Dr. Rolena Infante. [name] is a pleasant 57 year old male who is a former football Insurance claims handler at [employer]. He recently underwent surgery for spinal stenosis in January and has been slow to recover from that. He's developed worsening swelling in the left leg and underwent venous Doppler today which demonstrated an occlusive thrombus of the left popliteal and femoral vein which appeared to be chronic. It is likely this is acute on chronic and I would favor treatment. In addition his blood pressure is markedly elevated today at 202/107. He reports taking his medications this morning.  07/11/2018  [name] returns today for follow-up.  He was last seen and started on Xarelto by myself in May 2018 for DVT.  He has not been on the medication for about a year and a half.  He was supposed to return for follow-up in 6 months.  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  In addition he had poorly controlled hypertension.  We did adjust his medications however he still remains above goal.  He denies any lower extremity pain and reports some chronic mild ankle edema which may be related to neuropathy.  EKG today was normal.  PMHx:  Past Medical History:  Diagnosis Date  . Arthritis   . Diabetes mellitus without complication ([place]) Resolved 2014 after weight loss  . Hyperlipidemia Resolved 2014 after weight loss  . Hypertension   . Weight loss     Past Surgical History:  Procedure Laterality Date  . LAPAROSCOPIC GASTRIC SLEEVE RESECTION    . LUMBAR LAMINECTOMY  12/01/2016   Lumbar decompression L2-5 LUMBAR LAMINECTOMY/DECOMPRESSION MICRODISCECTOMY 3 LEVELS (N/A)  . LUMBAR LAMINECTOMY/DECOMPRESSION MICRODISCECTOMY N/A 12/01/2016   Procedure: Lumbar  decompression L2-5 LUMBAR LAMINECTOMY/DECOMPRESSION MICRODISCECTOMY 3 LEVELS;  Surgeon: Melina Schools, MD;  Location: [facility];  Service: Orthopedics;  Laterality: N/A;    FAMHx:  Family History  Problem Relation Age of Onset  . Diabetes Mother   . Hypertension Mother   . Diabetes Father   . Hypertension Father     SOCHx:   reports that he has never smoked. He has never used smokeless tobacco. He reports that he does not drink alcohol or use drugs.  ALLERGIES:  Allergies  Allergen Reactions  . No Known Allergies     ROS: Pertinent items noted in HPI and remainder of comprehensive ROS otherwise negative.  HOME MEDS: Current Outpatient Medications on File Prior to Visit  Medication Sig Dispense Refill  . amLODipine (NORVASC) 10 MG tablet TAKE 1 TABLET BY MOUTH ONCE DAILY 30 tablet 0  . amoxicillin (AMOXIL) 500 MG capsule Take 1 capsule by mouth 3 (three) times daily. For 5 days  0  . cholecalciferol 2000 units TABS Take 1 tablet (2,000 Units total) by mouth daily. 30 tablet 0  . hydrochlorothiazide (HYDRODIURIL) 25 MG tablet TAKE 1 TABLET BY MOUTH ONCE DAILY 30 tablet 0  . irbesartan (AVAPRO) 300 MG tablet TAKE 1 TABLET BY MOUTH ONCE DAILY 30 tablet 0  . methocarbamol (ROBAXIN) 500 MG tablet Take 1 tablet (500 mg total) by mouth every 6 (six) hours as needed for muscle spasms. 60 tablet 0  . Multiple Vitamins-Minerals (MULTIVITAMIN WITH MINERALS) tablet Take 1 tablet by mouth daily.    Marland Kitchen  XARELTO 20 MG TABS tablet TAKE 1 TABLET BY MOUTH ONCE DAILY WITH SUPPER 90 tablet 0   No current facility-administered medications on file prior to visit.     LABS/IMAGING: No results found for this or any previous visit (from the past 48 hour(s)). No results found.  LIPID PANEL:    Component Value Date/Time   CHOL 149 07/16/2014 0535   TRIG 59 07/16/2014 0535   HDL 77 07/16/2014 0535   CHOLHDL 1.9 07/16/2014 0535   VLDL 12 07/16/2014 0535   LDLCALC 60 07/16/2014 0535    WEIGHTS: Wt  Readings from Last 3 Encounters:  07/11/18 289 lb 12.8 oz (131.5 kg)  03/22/17 270 lb 12.8 oz (122.8 kg)  12/22/16 259 lb 0.7 oz (117.5 kg)    VITALS: BP (!) 150/90 (BP Location: Right Arm, Patient Position: Sitting, Cuff Size: Large)   Pulse 79   Ht 6\' 7"  (2.007 m)   Wt 289 lb 12.8 oz (131.5 kg)   BMI 32.65 kg/m   EXAM: General appearance: alert, no distress and very tall  and uses a walker to ambulate Lungs: clear to auscultation bilaterally Heart: regular rate and rhythm Extremities: Left left (especially thigh) swollen larger than the right Pulses: 2+ and symmetric Skin: Skin color, texture, turgor normal. No rashes or lesions Neurologic: Grossly normal Psych: Pleasant  EKG: Sinus rhythm 79-personally reviewed  ASSESSMENT: 1. 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 Uncontrolled hypertension  PLAN: 1.   Mr. Rosana Hoes had subacute/chronic appearing occlusive DVT of the left lower extremity.  This was subsequent to surgery and it was recommended a minimum of 6 months of treatment.  He has been on more than 15 months of anticoagulation.  I like to repeat his ultrasound and if it is negative consider stopping Xarelto.  Blood pressure remains elevated.  He is advised to follow blood pressures at home and will arrange for pharmacist hypertension visit in 1 month.  Follow-up as needed with me afterwards.  Blood pressure can be further managed by his primary care provider.  Pixie Casino, MD, [facility], [facility] Director of the [facility]  Card[facility] Diplomate of the American Board of Clinical Lipidology Attending Cardiologist  Direct Dial: [phone]  Fax: [phone]  Website:  [email or web address] 07/11/2018, 3:51 PM

## 2018-07-19 ENCOUNTER — Ambulatory Visit (HOSPITAL_COMMUNITY)
Admission: RE | Admit: 2018-07-19 | Discharge: 2018-07-19 | Disposition: A | Discharge status: Home / Self Care | Payer: BLUE CROSS/BLUE SHIELD | Source: Ambulatory Visit | Attending: Internal Medicine | Admitting: Internal Medicine

## 2018-07-19 DIAGNOSIS — I824Z2 Acute embolism and thrombosis of unspecified deep veins of left distal lower extremity: Secondary | ICD-10-CM

## 2018-07-21 ENCOUNTER — Other Ambulatory Visit: Payer: Self-pay | Admitting: Internal Medicine

## 2018-08-15 ENCOUNTER — Ambulatory Visit: Payer: BLUE CROSS/BLUE SHIELD

## 2018-08-24 ENCOUNTER — Ambulatory Visit (INDEPENDENT_AMBULATORY_CARE_PROVIDER_SITE_OTHER): Payer: BLUE CROSS/BLUE SHIELD | Admitting: Pharmacist Clinician (PhC)/ Clinical Pharmacy Specialist

## 2018-08-24 ENCOUNTER — Encounter: Payer: Self-pay | Admitting: Pharmacist Clinician (PhC)/ Clinical Pharmacy Specialist

## 2018-08-24 DIAGNOSIS — I10 Essential (primary) hypertension: Secondary | ICD-10-CM | POA: Diagnosis not present

## 2018-08-24 NOTE — Progress Notes (Signed)
08/24/2018 [name] 06/12/1961 [id number]   HPI:  [name] is a 57 y.o. male patient of Dr Debara Pickett, with a PMH below who presents today for hypertension clinic evaluation.  He was previously seen in CVRR about 14 months ago, at which time we had his pressure well controlled on his current three medications.  Patient admits that he did well for some time, but then started to "slack" and miss doses of meds, as well as cutting back on his time in PT.  He saw Dr. Debara Pickett about 6 weeks ago and his pressure was back up to 150/90.    In addition to hypertension, his medical history is significant for chronic DVT, and spinal stenosis.     Blood Pressure Goal:  130/80  Current Medications:  Amlodipine 10 mg pm  HCTZ 25 mg qd pm  Irbesartan 300 mg am  Current Labs:  04/2017:  Na 143, K 3.7, BUN 18, SCr 0.90, GFR 109, Glu 86  11/2016:  Na 140, K 3.8, BUN 22, SCr 0.99, GFR >60, Glu 118  Family Hx:  Mother with hypertension, DM, died in her 35's  Patient did not know his father  2 of 3 siblings with hypertension  Daughter (age 21) with hypertension  Social Hx:  No tobacco, some social alcohol; coffee up to 2 cups per day, only rare soda  Diet:  Mix of home cooked as well as eating out.  Restaurants include both fast food and sit down style; does not add salt at the table  Exercise:  Still doing therapy, but only 2 days per week; admits activity level has dropped in past year  Home BP readings:  Home cuff < 68 years old, arm cuff from [employer].  Did not bring readings, but states since seeing Dr. Debara Pickett and getting back to taking his medications on a daily basis, it has dropped the systolic reading from 852'D to mostly 130-140's.    Intolerances:   none  Wt Readings from Last 3 Encounters:  07/11/18 289 lb 12.8 oz (131.5 kg)  03/22/17 270 lb 12.8 oz (122.8 kg)  12/22/16 259 lb 0.7 oz (117.5 kg)   BP Readings from Last 3 Encounters:  08/24/18 (!) 148/96  07/11/18 (!) 150/90    05/11/17 138/82   Pulse Readings from Last 3 Encounters:  08/24/18 66  07/11/18 79  05/11/17 76    Current Outpatient Medications  Medication Sig Dispense Refill  . amLODipine (NORVASC) 10 MG tablet TAKE 1 TABLET BY MOUTH ONCE DAILY 30 tablet 0  . amoxicillin (AMOXIL) 500 MG capsule Take 1 capsule by mouth 3 (three) times daily. For 5 days  0  . cholecalciferol 2000 units TABS Take 1 tablet (2,000 Units total) by mouth daily. 30 tablet 0  . hydrochlorothiazide (HYDRODIURIL) 25 MG tablet TAKE 1 TABLET BY MOUTH ONCE DAILY 30 tablet 0  . irbesartan (AVAPRO) 300 MG tablet TAKE 1 TABLET BY MOUTH ONCE DAILY 30 tablet 0  . Multiple Vitamins-Minerals (MULTIVITAMIN WITH MINERALS) tablet Take 1 tablet by mouth daily.     No current facility-administered medications for this visit.     Allergies  Allergen Reactions  . No Known Allergies     Past Medical History:  Diagnosis Date  . Arthritis   . Diabetes mellitus without complication ([place]) Resolved 2014 after weight loss  . Hyperlipidemia Resolved 2014 after weight loss  . Hypertension   . Weight loss      Blood pressure Marland Kitchen)  148/96, pulse 66.   Essential hypertension Patient with essential hypertension, poor control, probably due to patient non-compliance.  Had a discussion about the timing of his daily medications, and the need for consistency.  He will work on getting back into the habit of doing so, and we will see him back in 4 weeks for follow up.  At that time, it was stressed, he needs to bring his home cuff and list of readings.     Tommy Medal PharmD CPP [facility] [address] [place], [place] [address]

## 2018-08-24 NOTE — Assessment & Plan Note (Signed)
Patient with essential hypertension, poor control, probably due to patient non-compliance.  Had a discussion about the timing of his daily medications, and the need for consistency.  He will work on getting back into the habit of doing so, and we will see him back in 4 weeks for follow up.  At that time, it was stressed, he needs to bring his home cuff and list of readings.

## 2018-08-24 NOTE — Patient Instructions (Signed)
Return for a a follow up appointment in 1 month  Your blood pressure today is 148/96  Check your blood pressure at home daily and keep record of the readings.  Take your BP meds as follows:  AM:  Irbesartan 300 mg  PM: amlodipine 10 mg, hydrochlorothiazide 25 mg  Bring all of your meds, your BP cuff and your record of home blood pressures to your next appointment.  Exercise as you're able, try to walk approximately 30 minutes per day.  Keep salt intake to a minimum, especially watch canned and prepared boxed foods.  Eat more fresh fruits and vegetables and fewer canned items.  Avoid eating in fast food restaurants.    HOW TO TAKE YOUR BLOOD PRESSURE: . Rest 5 minutes before taking your blood pressure. .  Don't smoke or drink caffeinated beverages for at least 30 minutes before. . Take your blood pressure before (not after) you eat. . Sit comfortably with your back supported and both feet on the floor (don't cross your legs). . Elevate your arm to heart level on a table or a desk. . Use the proper sized cuff. It should fit smoothly and snugly around your bare upper arm. There should be enough room to slip a fingertip under the cuff. The bottom edge of the cuff should be 1 inch above the crease of the elbow. . Ideally, take 3 measurements at one sitting and record the average.

## 2018-09-26 ENCOUNTER — Other Ambulatory Visit: Payer: Self-pay | Admitting: Internal Medicine

## 2018-09-26 DIAGNOSIS — I10 Essential (primary) hypertension: Secondary | ICD-10-CM

## 2018-09-26 MED ORDER — HYDROCHLOROTHIAZIDE 25 MG PO TABS: 25.0000 mg | ORAL_TABLET | Freq: Every day | ORAL | 0 refills | Status: DC

## 2018-09-26 MED ORDER — IRBESARTAN 300 MG PO TABS: 300.0000 mg | ORAL_TABLET | Freq: Every day | ORAL | 0 refills | Status: DC

## 2018-09-26 MED ORDER — AMLODIPINE BESYLATE 10 MG PO TABS: 10.0000 mg | ORAL_TABLET | Freq: Every day | ORAL | 0 refills | Status: DC

## 2018-09-26 NOTE — Telephone Encounter (Signed)
New message     *STAT* If patient is at the pharmacy, call can be transferred to refill team.   1. Which medications need to be refilled? (please list name of each medication and dose if known)  hydrochlorothiazide (HYDRODIURIL) 25 MG tablet TAKE 1 TABLET BY MOUTH ONCE DAILY     amLODipine (NORVASC) 10 MG tablet TAKE 1 TABLET BY MOUTH ONCE DAILY    irbesartan (AVAPRO) 300 MG tablet TAKE 1 TABLET BY MOUTH ONCE DAILY     2. Which pharmacy/location (including street and city if local pharmacy) is medication to be sent to? [facility] [address]   3. Do they need a 30 day or 90 day supply? [place]

## 2018-09-26 NOTE — Telephone Encounter (Signed)
Rx request sent to pharmacy.  

## 2018-09-28 ENCOUNTER — Ambulatory Visit: Payer: BLUE CROSS/BLUE SHIELD

## 2018-10-18 ENCOUNTER — Ambulatory Visit: Payer: BLUE CROSS/BLUE SHIELD

## 2018-11-07 ENCOUNTER — Ambulatory Visit: Payer: BLUE CROSS/BLUE SHIELD

## 2018-11-28 ENCOUNTER — Ambulatory Visit (INDEPENDENT_AMBULATORY_CARE_PROVIDER_SITE_OTHER): Payer: Self-pay | Admitting: Pharmacist

## 2018-11-28 VITALS — BP 152/98 | HR 76 | Resp 16 | Ht 79.0 in

## 2018-11-28 DIAGNOSIS — I10 Essential (primary) hypertension: Secondary | ICD-10-CM

## 2018-11-28 MED ORDER — CHLORTHALIDONE 25 MG PO TABS: 25.0000 mg | ORAL_TABLET | Freq: Every day | ORAL | 1 refills | Status: DC

## 2018-11-28 MED ORDER — IRBESARTAN 300 MG PO TABS: 300.0000 mg | ORAL_TABLET | Freq: Every day | ORAL | 1 refills | Status: DC

## 2018-11-28 MED ORDER — AMLODIPINE BESYLATE 10 MG PO TABS: 10.0000 mg | ORAL_TABLET | Freq: Every day | ORAL | 1 refills | Status: DC

## 2018-11-28 NOTE — Patient Instructions (Signed)
Return for a  follow up appointment 1 MONTH  Go to the lab in 2 WEEKS  Check your blood pressure at home daily (if able) and keep record of the readings.  Take your BP meds as follows: *STOP TAKING HCTZ* *START TAKING CHLORTHALIDONE 25MG  DAILY*  Bring all of your meds, your BP cuff and your record of home blood pressures to your next appointment.  Exercise as you're able, try to walk approximately 30 minutes per day.  Keep salt intake to a minimum, especially watch canned and prepared boxed foods.  Eat more fresh fruits and vegetables and fewer canned items.  Avoid eating in fast food restaurants.    HOW TO TAKE YOUR BLOOD PRESSURE: . Rest 5 minutes before taking your blood pressure. .  Don't smoke or drink caffeinated beverages for at least 30 minutes before. . Take your blood pressure before (not after) you eat. . Sit comfortably with your back supported and both feet on the floor (don't cross your legs). . Elevate your arm to heart level on a table or a desk. . Use the proper sized cuff. It should fit smoothly and snugly around your bare upper arm. There should be enough room to slip a fingertip under the cuff. The bottom edge of the cuff should be 1 inch above the crease of the elbow. . Ideally, take 3 measurements at one sitting and record the average.

## 2018-11-28 NOTE — Progress Notes (Signed)
HPI:  [name] is a 58 y.o. male patient of Dr Debara Pickett, with a PMH below who presents today for hypertension clinic follow up.  In addition to hypertension, his medical history is significant for chronic DVT, and spinal stenosis. He was previously seen in CVRR about 14 months ago, at which time we had his pressure well controlled on his current three medications.  Patient admits that he did well for some time, but then started to "slack" and missed doses of meds, as well as cutting back on his time in PT.  He saw Dr. Debara Pickett about 6 weeks ago and his pressure was back up to 150/90.      Patient presents today and report compliance to ALL medication as previously prescribed. Denies problems with current therapy. No chest pain, dizziness, lightheadedness, or blurry vision either.    Blood Pressure Goal:  130/80  Current Medications:  Amlodipine 10 mg pm  HCTZ 25 mg qd pm  Irbesartan 300 mg am  Current Labs:  04/2017:  Na 143, K 3.7, BUN 18, SCr 0.90, GFR 109, Glu 86  11/2016:  Na 140, K 3.8, BUN 22, SCr 0.99, GFR >60, Glu 118  Family Hx:  Mother with hypertension, DM, died in her 3's  Patient did not know his father  2 of 3 siblings with hypertension  Daughter (age 48) with hypertension  Social Hx:  No tobacco, some social alcohol; coffee up to 2 cups per day, only rare soda  Diet:  Mix of home cooked as well as eating out.  Restaurants include both fast food and sit down style; does not add salt at the table  Exercise:  Still doing therapy, but only 2 days per week; admits activity level has dropped in past year  Home BP readings:  Home cuff < 72 years old, arm cuff from [employer].  140's and 155s per patient's recollection       Intolerances:   none  Wt Readings from Last 3 Encounters:  07/11/18 289 lb 12.8 oz (131.5 kg)  03/22/17 270 lb 12.8 oz (122.8 kg)  12/22/16 259 lb 0.7 oz (117.5 kg)   BP Readings from Last 3 Encounters:  11/28/18 (!) 152/98  08/24/18 (!) 148/96   07/11/18 (!) 150/90   Pulse Readings from Last 3 Encounters:  11/28/18 76  08/24/18 66  07/11/18 79    Current Outpatient Medications  Medication Sig Dispense Refill  . amLODipine (NORVASC) 10 MG tablet Take 1 tablet (10 mg total) by mouth daily. 30 tablet 1  . irbesartan (AVAPRO) 300 MG tablet Take 1 tablet (300 mg total) by mouth daily. 30 tablet 1  . Multiple Vitamins-Minerals (MULTIVITAMIN WITH MINERALS) tablet Take 1 tablet by mouth daily.    Marland Kitchen amoxicillin (AMOXIL) 500 MG capsule Take 1 capsule by mouth 3 (three) times daily. For 5 days  0  . chlorthalidone (HYGROTON) 25 MG tablet Take 1 tablet (25 mg total) by mouth daily. TO REPLACE HCTZ 30 tablet 1  . cholecalciferol 2000 units TABS Take 1 tablet (2,000 Units total) by mouth daily. (Patient not taking: Reported on 11/28/2018) 30 tablet 0   No current facility-administered medications for this visit.     Allergies  Allergen Reactions  . No Known Allergies     Past Medical History:  Diagnosis Date  . Arthritis   . Diabetes mellitus without complication ([place]) Resolved 2014 after weight loss  . Hyperlipidemia Resolved 2014 after weight loss  . Hypertension   .  Weight loss      Blood pressure (!) 152/98, pulse 76, resp. rate 16, height 6\' 7"  (2.007 m).   Essential hypertension BP remains above desired goal and patient reports compliance with medication and low sodium diet. Will d/u HCTZ and initiate chlorthalidone 25mg  daily. Patient instructed to repeat BMET in 2 weeks, monitor BP daily, and follow up with clinic in 4 weeks.    Marliyah Reid Rodriguez-Guzman PharmD, BCPS, [place] [address] [place] [address] 12/04/2018 6:18 PM

## 2018-12-04 ENCOUNTER — Encounter: Payer: Self-pay | Admitting: Pharmacist

## 2018-12-04 NOTE — Assessment & Plan Note (Signed)
BP remains above desired goal and patient reports compliance with medication and low sodium diet. Will d/u HCTZ and initiate chlorthalidone 25mg  daily. Patient instructed to repeat BMET in 2 weeks, monitor BP daily, and follow up with clinic in 4 weeks.

## 2019-01-02 ENCOUNTER — Ambulatory Visit: Payer: Self-pay

## 2019-01-11 ENCOUNTER — Ambulatory Visit: Payer: Self-pay

## 2019-01-25 ENCOUNTER — Ambulatory Visit (INDEPENDENT_AMBULATORY_CARE_PROVIDER_SITE_OTHER): Payer: BLUE CROSS/BLUE SHIELD | Admitting: Pharmacist Clinician (PhC)/ Clinical Pharmacy Specialist

## 2019-01-25 DIAGNOSIS — I10 Essential (primary) hypertension: Secondary | ICD-10-CM

## 2019-01-25 MED ORDER — HYDRALAZINE HCL 25 MG PO TABS: 25.0000 mg | ORAL_TABLET | Freq: Two times a day (BID) | ORAL | 5 refills | Status: DC

## 2019-01-25 MED ORDER — AMLODIPINE BESYLATE 10 MG PO TABS: 10.0000 mg | ORAL_TABLET | Freq: Every day | ORAL | 1 refills | Status: DC

## 2019-01-25 MED ORDER — CHLORTHALIDONE 25 MG PO TABS: 25.0000 mg | ORAL_TABLET | Freq: Every day | ORAL | 1 refills | Status: DC

## 2019-01-25 MED ORDER — IRBESARTAN 300 MG PO TABS: 300.0000 mg | ORAL_TABLET | Freq: Every day | ORAL | 1 refills | Status: DC

## 2019-01-25 NOTE — Patient Instructions (Addendum)
Return for a a follow up appointment on April 9  Go to the lab today for metabolic panel  Your blood pressure today is 148/78  (goal is < 130/80)  Check your blood pressure at home several times each week and keep record of the readings.  Take your BP meds as follows:  Start hydralazine 25 mg twice daily  Move amlodipine to evenings with second dose of hydralazine starting Friday March 6.  Continue irbesartan and chlorthalidone in the mornings.  Bring all of your meds, your BP cuff and your record of home blood pressures to your next appointment.  Exercise as you're able, try to walk approximately 30 minutes per day.  Keep salt intake to a minimum, especially watch canned and prepared boxed foods.  Eat more fresh fruits and vegetables and fewer canned items.  Avoid eating in fast food restaurants.    HOW TO TAKE YOUR BLOOD PRESSURE: . Rest 5 minutes before taking your blood pressure. .  Don't smoke or drink caffeinated beverages for at least 30 minutes before. . Take your blood pressure before (not after) you eat. . Sit comfortably with your back supported and both feet on the floor (don't cross your legs). . Elevate your arm to heart level on a table or a desk. . Use the proper sized cuff. It should fit smoothly and snugly around your bare upper arm. There should be enough room to slip a fingertip under the cuff. The bottom edge of the cuff should be 1 inch above the crease of the elbow. . Ideally, take 3 measurements at one sitting and record the average.

## 2019-01-25 NOTE — Assessment & Plan Note (Signed)
Patient with essential hypertension, now doing better with 3 drug combination, although not yet to goal.  Will add hydralazine 25 mg bid and have asked that he move the amlodipine to evenings with the second dose of hydralazine.  This will hopefully help with compliance, as he previously has been taking medications only once daily, and hopefull balance out his meds and further drop his pressure.  Will have him check metabolic panel today and see him back in 1 month for follow up.

## 2019-01-25 NOTE — Progress Notes (Signed)
HPI:  [name] is a 58 y.o. male patient of Dr Debara Pickett, with a PMH below who presents today for hypertension clinic follow up.  In addition to hypertension, his medical history is significant for chronic DVT, and spinal stenosis. He was previously seen in [facility] about 14 months ago, at which time we had his pressure well controlled on his current three medications.  Patient admits that he did well for some time, but then started to "slack" and missed doses of meds, as well as cutting back on his time in PT.  He saw Dr. Debara Pickett about 6 weeks ago and his pressure was back up to 150/90.      At his last visit, patient presents today and report compliance to ALL medication as previously prescribed. His BP was still elevated, so the hctz was replaced with chlorthalidone 25 mg daily. He was to get a BMET drawn prior to this appointment, which has not been done, so we will take patient to the lab today.    Today he reports feeling well overall.  He did note some diarrhea for the first week or two that he was on chlorthalidone, but since that has cleared up, he has no worries.  At one time we had suggested he take   Blood Pressure Goal:  130/80  Current Medications:  Amlodipine 10 mg - am  Chlorthalidone 25 mg qd - am  Irbesartan 300 mg - am  Current Labs:  04/2017:  Na 143, K 3.7, BUN 18, SCr 0.90, GFR 109, Glu 86  11/2016:  Na 140, K 3.8, BUN 22, SCr 0.99, GFR >60, Glu 118  Family Hx:  Mother with hypertension, DM, died in her 47's  Patient did not know his father  2 of 3 siblings with hypertension  Daughter (age 9) with hypertension  Social Hx:  No tobacco, some social alcohol; coffee up to 2 cups per day, only rare soda  Diet:  Mix of home cooked as well as eating out.  Restaurants include both fast food and sit down style; does not add salt at the table  Exercise:  Still doing therapy, but only 2 days per week; admits activity level has dropped in past year  Home BP  readings:  Home cuff < 40 years old, arm cuff from [employer].  No readings with him today, but does note he has seen a few drop down into the 081'K systolic.        Intolerances:   none  Wt Readings from Last 3 Encounters:  07/11/18 289 lb 12.8 oz (131.5 kg)  03/22/17 270 lb 12.8 oz (122.8 kg)  12/22/16 259 lb 0.7 oz (117.5 kg)   BP Readings from Last 3 Encounters:  01/25/19 (!) 148/78  11/28/18 (!) 152/98  08/24/18 (!) 148/96   Pulse Readings from Last 3 Encounters:  01/25/19 70  11/28/18 76  08/24/18 66    Current Outpatient Medications  Medication Sig Dispense Refill  . amLODipine (NORVASC) 10 MG tablet Take 1 tablet (10 mg total) by mouth daily. 90 tablet 1  . amoxicillin (AMOXIL) 500 MG capsule Take 1 capsule by mouth 3 (three) times daily. For 5 days  0  . chlorthalidone (HYGROTON) 25 MG tablet Take 1 tablet (25 mg total) by mouth daily. TO REPLACE HCTZ 90 tablet 1  . cholecalciferol 2000 units TABS Take 1 tablet (2,000 Units total) by mouth daily. (Patient not taking: Reported on 11/28/2018) 30 tablet 0  . hydrALAZINE (APRESOLINE)  25 MG tablet Take 1 tablet (25 mg total) by mouth 2 (two) times daily. 60 tablet 5  . irbesartan (AVAPRO) 300 MG tablet Take 1 tablet (300 mg total) by mouth daily. 90 tablet 1  . Multiple Vitamins-Minerals (MULTIVITAMIN WITH MINERALS) tablet Take 1 tablet by mouth daily.     No current facility-administered medications for this visit.     Allergies  Allergen Reactions  . No Known Allergies     Past Medical History:  Diagnosis Date  . Arthritis   . Diabetes mellitus without complication ([place]) Resolved 2014 after weight loss  . Hyperlipidemia Resolved 2014 after weight loss  . Hypertension   . Weight loss      Blood pressure (!) 148/78, pulse 70.   Essential hypertension Patient with essential hypertension, now doing better with 3 drug combination, although not yet to goal.  Will add hydralazine 25 mg bid and have asked that he move the  amlodipine to evenings with the second dose of hydralazine.  This will hopefully help with compliance, as he previously has been taking medications only once daily, and hopefull balance out his meds and further drop his pressure.  Will have him check metabolic panel today and see him back in 1 month for follow up.     Tommy Medal PharmD CPP [facility] [address] [place] [address] 01/25/2019 4:25 PM

## 2019-01-26 LAB — BASIC METABOLIC PANEL
BUN / CREAT RATIO: 13 (ref 9–20)
BUN: 15 mg/dL (ref 6–24)
CHLORIDE: 98 mmol/L (ref 96–106)
CO2: 24 mmol/L (ref 20–29)
CREATININE: 1.19 mg/dL (ref 0.76–1.27)
Calcium: 9.4 mg/dL (ref 8.7–10.2)
GFR calc Af Amer: 77 mL/min/{1.73_m2} (ref >59)
GFR calc non Af Amer: 66 mL/min/{1.73_m2} (ref >59)
GLUCOSE: 83 mg/dL (ref 65–99)
POTASSIUM: 3.6 mmol/L (ref 3.5–5.2)
SODIUM: 139 mmol/L (ref 134–144)

## 2019-01-27 ENCOUNTER — Other Ambulatory Visit: Payer: Self-pay | Admitting: Internal Medicine

## 2019-02-26 ENCOUNTER — Telehealth: Payer: Self-pay | Admitting: Pharmacist

## 2019-02-26 NOTE — Telephone Encounter (Signed)
LMOM; HTN clinic follow up for 03/01/2019 was cancelled due to CODIV-19.  Plan to call back in 1-2 weeks for f/u visit over the phone.

## 2019-03-01 ENCOUNTER — Ambulatory Visit: Payer: BLUE CROSS/BLUE SHIELD

## 2019-03-19 ENCOUNTER — Encounter: Payer: Self-pay | Admitting: Pharmacist Clinician (PhC)/ Clinical Pharmacy Specialist

## 2019-04-11 ENCOUNTER — Telehealth: Payer: Self-pay | Admitting: Pharmacist

## 2019-04-11 NOTE — Telephone Encounter (Signed)
3rd message left for patient to schedule HTN clinic telephone follow up

## 2019-07-17 ENCOUNTER — Other Ambulatory Visit: Payer: Self-pay | Admitting: Internal Medicine

## 2019-07-24 ENCOUNTER — Other Ambulatory Visit: Payer: Self-pay | Admitting: Internal Medicine

## 2019-08-25 ENCOUNTER — Other Ambulatory Visit: Payer: Self-pay | Admitting: Internal Medicine

## 2019-09-04 ENCOUNTER — Other Ambulatory Visit: Payer: Self-pay | Admitting: Internal Medicine

## 2019-09-21 ENCOUNTER — Other Ambulatory Visit: Payer: Self-pay

## 2019-09-21 MED ORDER — AMLODIPINE BESYLATE 10 MG PO TABS: ORAL_TABLET | ORAL | 3 refills | Status: DC

## 2019-09-21 MED ORDER — IRBESARTAN 300 MG PO TABS: ORAL_TABLET | ORAL | 3 refills | Status: DC

## 2019-09-21 MED ORDER — HYDRALAZINE HCL 25 MG PO TABS: ORAL_TABLET | ORAL | 3 refills | Status: DC

## 2019-09-21 MED ORDER — CHLORTHALIDONE 25 MG PO TABS: ORAL_TABLET | ORAL | 3 refills | Status: DC

## 2019-09-21 NOTE — Addendum Note (Signed)
Addended by: Cain Sieve on: 09/21/2019 04:38 PM   Modules accepted: Orders

## 2019-09-21 NOTE — Telephone Encounter (Signed)
Please send these refill requests to pharmacy - the patient has been followed in the hypertension clinic and I have not seen since 2018. I'm not sure what the current regimen is.  Dr Lemmie Evens

## 2019-09-21 NOTE — Telephone Encounter (Signed)
Refilled prescriptions per request.

## 2020-01-14 ENCOUNTER — Other Ambulatory Visit: Payer: Self-pay | Admitting: Internal Medicine

## 2020-01-16 NOTE — Telephone Encounter (Signed)
See below.  Requesting alternative.

## 2020-11-01 ENCOUNTER — Other Ambulatory Visit: Payer: Self-pay | Admitting: Internal Medicine

## 2020-11-02 ENCOUNTER — Other Ambulatory Visit: Payer: Self-pay | Admitting: Internal Medicine

## 2020-12-31 ENCOUNTER — Other Ambulatory Visit: Payer: Self-pay

## 2020-12-31 ENCOUNTER — Encounter: Payer: Self-pay | Admitting: Internal Medicine

## 2020-12-31 ENCOUNTER — Telehealth (INDEPENDENT_AMBULATORY_CARE_PROVIDER_SITE_OTHER): Payer: Medicare HMO | Admitting: Internal Medicine

## 2020-12-31 DIAGNOSIS — Z7689 Persons encountering health services in other specified circumstances: Secondary | ICD-10-CM | POA: Diagnosis not present

## 2020-12-31 DIAGNOSIS — I10 Essential (primary) hypertension: Secondary | ICD-10-CM | POA: Diagnosis not present

## 2020-12-31 DIAGNOSIS — Z1211 Encounter for screening for malignant neoplasm of colon: Secondary | ICD-10-CM | POA: Diagnosis not present

## 2020-12-31 MED ORDER — AMLODIPINE BESYLATE 10 MG PO TABS: ORAL_TABLET | ORAL | 3 refills | Status: DC

## 2020-12-31 NOTE — Progress Notes (Signed)
Virtual Visit via Telephone Note  I connected with [name], on 12/31/2020 at 3:18 PM by telephone due to the COVID-19 pandemic and verified that I am speaking with the correct person using two identifiers.   Consent: I discussed the limitations, risks, security and privacy concerns of performing an evaluation and management service by telephone and the availability of in person appointments. I also discussed with the patient that there may be a patient responsible charge related to this service. The patient expressed understanding and agreed to proceed.   Location of Patient: Home   Location of Provider: Clinic    Persons participating in Telemedicine visit: Djuan F Nikolai Cyndie Mull Dr. Juleen China      History of Present Illness: Patient has a visit to establish care. Was receiving primary care at [facility] in the past, last seen in 2020. 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   Does not monitor BP at home. Has a cuff but does not have batteries right now. Needs refill for Amlodipine.    Past Medical History:  Diagnosis Date  . Arthritis   . Diabetes mellitus without complication ([place]) Resolved 2014 after weight loss  . Hyperlipidemia Resolved 2014 after weight loss  . Hypertension   . Weight loss    Allergies  Allergen Reactions  . No Known Allergies     Current Outpatient Medications on File Prior to Visit  Medication Sig Dispense Refill  . amLODipine (NORVASC) 10 MG tablet TAKE 1 TABLET(10 MG) BY MOUTH DAILY 90 tablet 3  . chlorthalidone (HYGROTON) 25 MG tablet TAKE 1 TABLET BY MOUTH DAILY 90 tablet 3  . hydrALAZINE (APRESOLINE) 25 MG tablet TAKE 1 TABLET(25 MG) BY MOUTH TWICE DAILY 180 tablet 3  . irbesartan (AVAPRO) 300 MG tablet TAKE 1 TABLET BY MOUTH EVERY DAY 90 tablet 3  . Multiple Vitamins-Minerals  (MULTIVITAMIN WITH MINERALS) tablet Take 1 tablet by mouth daily.    Marland Kitchen amoxicillin (AMOXIL) 500 MG capsule Take 1 capsule by mouth 3 (three) times daily. For 5 days (Patient not taking: Reported on 12/31/2020)  0  . cholecalciferol 2000 units TABS Take 1 tablet (2,000 Units total) by mouth daily. (Patient not taking: No sig reported) 30 tablet 0   No current facility-administered medications on file prior to visit.    Observations/Objective: NAD. Speaking clearly.  Work of breathing normal.  Alert and oriented. Mood appropriate.   Assessment and Plan: 1. Encounter to establish care Reviewed patient's PMH, social history, surgical history, and medications.  Is overdue for annual exam, screening blood work, and health maintenance topics. Have asked patient to return for visit to address these items.   2. Essential hypertension Not currently monitoring. Will need f/u in person to assess BP. Asymptomatic. Continue current regimen.  - amLODipine (NORVASC) 10 MG tablet; TAKE 1 TABLET(10 MG) BY MOUTH DAILY  Dispense: 90 tablet; Refill: 3  3. Screening for colon cancer - Ambulatory referral to Gastroenterology   Follow Up Instructions: Annual exam    I discussed the assessment and treatment plan with the patient. The patient was provided an opportunity to ask questions and all were answered. The patient agreed with the plan and demonstrated an understanding of the instructions.   The patient was advised to call back or seek an in-person evaluation if the symptoms worsen or if the condition fails  to improve as anticipated.     I provided 8 minutes total of non-face-to-face time during this encounter including median intraservice time, reviewing previous notes, investigations, ordering medications, medical decision making, coordinating care and patient verbalized understanding at the end of the visit.    Phill Myron, D.O. Primary Care at [facility]  12/31/2020, 3:18 PM

## 2020-12-31 NOTE — Progress Notes (Signed)
RF on amlodipine

## 2021-01-12 ENCOUNTER — Ambulatory Visit: Payer: BLUE CROSS/BLUE SHIELD | Admitting: Internal Medicine

## 2021-01-15 ENCOUNTER — Encounter: Payer: Self-pay | Admitting: Internal Medicine

## 2021-02-05 ENCOUNTER — Telehealth: Payer: Self-pay | Admitting: *Deleted

## 2021-02-05 NOTE — Telephone Encounter (Signed)
Pt dropped off disability forms for provider to complete. Please call pt when ready for pick up. Pt ph [phone].  Placed forms in providers bin at front desk.

## 2021-02-06 NOTE — Telephone Encounter (Signed)
Message from Dr. Juleen China to writer regarding forms that patient left.  Patient informed of message below.   [10:48 AM] [name]  cant tell if its to apply for permanent disability because it looks different from the ones ive seen before but it also doesnt look like fmla   if its for SS disability he has to go to a doctor trained to do those exams and have it filled out by them

## 2021-02-20 ENCOUNTER — Other Ambulatory Visit: Payer: Self-pay

## 2021-02-20 ENCOUNTER — Ambulatory Visit (AMBULATORY_SURGERY_CENTER): Payer: Self-pay

## 2021-02-20 VITALS — Ht 79.0 in | Wt 293.0 lb

## 2021-02-20 DIAGNOSIS — Z1211 Encounter for screening for malignant neoplasm of colon: Secondary | ICD-10-CM

## 2021-02-20 MED ORDER — SUTAB 1479-225-188 MG PO TABS: 12.0000 | ORAL_TABLET | ORAL | 0 refills | Status: DC

## 2021-02-20 NOTE — Progress Notes (Signed)
No egg or soy allergy known to patient  No issues with past sedation with any surgeries or procedures Patient denies ever being told they had issues or difficulty with intubation  No FH of Malignant Hyperthermia No diet pills per patient No home 02 use per patient  No blood thinners per patient   Pt denies issues with constipation states he has regular bms every 2 days, instruct pt to take miralax 1 capful daily x5 days before the procedure.  No A fib or A flutter  EMMI video to pt or via [place] 19 guidelines implemented in [place] today with Pt and RN  Pt is fully vaccinated  for Covid   sutab Coupon given to pt in PV today , Code to Pharmacy and  NO PA's for preps discussed with pt In PV today  Discussed with pt there will be an out-of-pocket cost for prep and that varies from $0 to 70 dollars   Due to the COVID-19 pandemic we are asking patients to follow certain guidelines.  Pt aware of COVID protocols and [facility] guidelines

## 2021-02-25 ENCOUNTER — Telehealth: Payer: Self-pay | Admitting: Family Medicine

## 2021-02-25 NOTE — Telephone Encounter (Signed)
I called pt to schedule appointment to complete paperwork. Pt states that he has moved on with another PCP due to him not hearing back from the clinical team.

## 2021-03-10 ENCOUNTER — Encounter: Payer: Self-pay | Admitting: Internal Medicine

## 2021-03-13 ENCOUNTER — Ambulatory Visit (AMBULATORY_SURGERY_CENTER): Payer: Medicare HMO | Admitting: Internal Medicine

## 2021-03-13 ENCOUNTER — Other Ambulatory Visit: Payer: Self-pay

## 2021-03-13 ENCOUNTER — Encounter: Payer: Self-pay | Admitting: Internal Medicine

## 2021-03-13 VITALS — BP 124/62 | HR 60 | Temp 98.2°F | Resp 16 | Ht 79.0 in | Wt 293.0 lb

## 2021-03-13 DIAGNOSIS — Z1211 Encounter for screening for malignant neoplasm of colon: Secondary | ICD-10-CM | POA: Diagnosis not present

## 2021-03-13 DIAGNOSIS — D123 Benign neoplasm of transverse colon: Secondary | ICD-10-CM

## 2021-03-13 MED ORDER — SODIUM CHLORIDE 0.9 % IV SOLN: 500.0000 mL | Freq: Once | INTRAVENOUS | Status: DC

## 2021-03-13 NOTE — Progress Notes (Signed)
CW - VS  Pt user a walker to ambulate.  Spinal stenosis, surgery with spinal cord injury. Maw  [name] states no medical or surgical changes since previsit or office visit.

## 2021-03-13 NOTE — Patient Instructions (Signed)
Thank you for allowing [place] to care for you today!  Biopsy results of polyp removed will be available in 1-2 weeks.  Resume previous diet and medications today.  Return to your normal daily activities tomorrow.  REPEAT COLONOSCOPY IN 1 YEAR.  There will be a more extensive prep next time and an extra day of clear liquids before the procedure. We will bring you in for a pre visit to discuss the instructions.     YOU HAD AN ENDOSCOPIC PROCEDURE TODAY AT [facility]:   Refer to the procedure report that was given to you for any specific questions about what was found during the examination.  If the procedure report does not answer your questions, please call your gastroenterologist to clarify.  If you requested that your care partner not be given the details of your procedure findings, then the procedure report has been included in a sealed envelope for you to review at your convenience later.  YOU SHOULD EXPECT: Some feelings of bloating in the abdomen. Passage of more gas than usual.  Walking can help get rid of the air that was put into your GI tract during the procedure and reduce the bloating. If you had a lower endoscopy (such as a colonoscopy or flexible sigmoidoscopy) you may notice spotting of blood in your stool or on the toilet paper. If you underwent a bowel prep for your procedure, you may not have a normal bowel movement for a few days.  Please Note:  You might notice some irritation and congestion in your nose or some drainage.  This is from the oxygen used during your procedure.  There is no need for concern and it should clear up in a day or so.  SYMPTOMS TO REPORT IMMEDIATELY:   Following lower endoscopy (colonoscopy or flexible sigmoidoscopy):  Excessive amounts of blood in the stool  Significant tenderness or worsening of abdominal pains  Swelling of the abdomen that is new, acute  Fever of 100F or higher    For urgent or emergent issues, a  gastroenterologist can be reached at any hour by calling [phone]. Do not use MyChart messaging for urgent concerns.    DIET:  We do recommend a small meal at first, but then you may proceed to your regular diet.  Drink plenty of fluids but you should avoid alcoholic beverages for 24 hours.  ACTIVITY:  You should plan to take it easy for the rest of today and you should NOT DRIVE or use heavy machinery until tomorrow (because of the sedation medicines used during the test).    FOLLOW UP: Our staff will call the number listed on your records 48-72 hours following your procedure to check on you and address any questions or concerns that you may have regarding the information given to you following your procedure. If we do not reach you, we will leave a message.  We will attempt to reach you two times.  During this call, we will ask if you have developed any symptoms of COVID 19. If you develop any symptoms (ie: fever, flu-like symptoms, shortness of breath, cough etc.) before then, please call [phone].  If you test positive for Covid 19 in the 2 weeks post procedure, please call and report this information to [place].    If any biopsies were taken you will be contacted by phone or by letter within the next 1-3 weeks.  Please call us at [phone] if you have not heard about the biopsies  in 3 weeks.    SIGNATURES/CONFIDENTIALITY: You and/or your care partner have signed paperwork which will be entered into your electronic medical record.  These signatures attest to the fact that that the information above on your After Visit Summary has been reviewed and is understood.  Full responsibility of the confidentiality of this discharge information lies with you and/or your care-partner.

## 2021-03-13 NOTE — Op Note (Signed)
[facility] Patient Name: [name] Procedure Date: 03/13/2021 8:34 AM MRN: [id number] Endoscopist: Wilhemina Bonito. Marina Goodell , MD Age: 60 Referring MD:  Date of Birth: 1961-02-12 Gender: Male Account #: [id number] Procedure:                Colonoscopy with cold snare polypectomy x 1 Indications:              Screening for colorectal malignant neoplasm reports                            previous exam in the [facility] area Medicines:                Monitored Anesthesia Care Procedure:                Pre-Anesthesia Assessment:                           - Prior to the procedure, a History and Physical                            was performed, and patient medications and                            allergies were reviewed. The patient's tolerance of                            previous anesthesia was also reviewed. The risks                            and benefits of the procedure and the sedation                            options and risks were discussed with the patient.                            All questions were answered, and informed consent                            was obtained. Prior Anticoagulants: The patient has                            taken no previous anticoagulant or antiplatelet                            agents. ASA Grade Assessment: II - A patient with                            mild systemic disease. After reviewing the risks                            and benefits, the patient was deemed in                            satisfactory condition to undergo the procedure.  After obtaining informed consent, the colonoscope                            was passed under direct vision. Throughout the                            procedure, the patient's blood pressure, pulse, and                            oxygen saturations were monitored continuously. The                            Olympus [id number]) [id number] was introduced                             through the anus and advanced to the the cecum,                            identified by appendiceal orifice and ileocecal                            valve. The ileocecal valve, appendiceal orifice,                            and rectum were photographed. The quality of the                            bowel preparation was poor. The colonoscopy was                            performed without difficulty. The patient tolerated                            the procedure well. The bowel preparation used was                            SUPREP/tablets via split dose instruction. Scope In: 9:04:51 AM Scope Out: 9:16:22 AM Scope Withdrawal Time: 0 hours 5 minutes 33 seconds  Total Procedure Duration: 0 hours 11 minutes 31 seconds  Findings:                 A 5 mm polyp was found in the transverse colon. The                            polyp was removed with a cold snare. Resection and                            retrieval were complete.                           The exam was otherwise without abnormality on                            direct and retroflexion views, though severely  limited by poor preparation. Complications:            No immediate complications. Estimated blood loss:                            None. Estimated Blood Loss:     Estimated blood loss: none. Impression:               - Preparation of the colon was poor.                           - One 5 mm polyp in the transverse colon, removed                            with a cold snare. Resected and retrieved.                           - The examination was otherwise normal on direct                            and retroflexion views with great limitation based                            on prep. Recommendation:           - Repeat colonoscopy in no more than 1 year for                            surveillance. Will need MORE EXTENSIVE PREP. Would                            recommend 2 days of clear liquids,  magnesium                            citrate, followed by split preparation.                           - Patient has a contact number available for                            emergencies. The signs and symptoms of potential                            delayed complications were discussed with the                            patient. Return to normal activities tomorrow.                            Written discharge instructions were provided to the                            patient.                           - Resume previous diet.                           -  Continue present medications.                           - Await pathology results. Docia Chuck. Henrene Pastor, MD 03/13/2021 9:21:33 AM This report has been signed electronically.

## 2021-03-13 NOTE — Progress Notes (Signed)
Called to room to assist during endoscopic procedure.  Patient ID and intended procedure confirmed with present staff. Received instructions for my participation in the procedure from the performing physician.  

## 2021-03-13 NOTE — Progress Notes (Signed)
pt tolerated well. VSS. awake and to recovery. Report given to RN.  

## 2021-03-17 ENCOUNTER — Telehealth: Payer: Self-pay

## 2021-03-17 ENCOUNTER — Telehealth: Payer: Self-pay | Admitting: *Deleted

## 2021-03-17 NOTE — Telephone Encounter (Signed)
Attempted to reach patient for post-procedure f/u call. No answer. Left message to him to please not hesitate to call us if he has any questions/concerns regarding his care.

## 2021-03-17 NOTE — Telephone Encounter (Signed)
Message left

## 2021-03-18 DIAGNOSIS — I10 Essential (primary) hypertension: Secondary | ICD-10-CM | POA: Diagnosis not present

## 2021-03-21 ENCOUNTER — Encounter: Payer: Self-pay | Admitting: Internal Medicine

## 2021-03-30 ENCOUNTER — Other Ambulatory Visit: Payer: Self-pay | Admitting: Internal Medicine

## 2021-03-30 DIAGNOSIS — I10 Essential (primary) hypertension: Secondary | ICD-10-CM

## 2021-11-02 ENCOUNTER — Other Ambulatory Visit: Payer: Self-pay | Admitting: Internal Medicine

## 2021-11-02 DIAGNOSIS — I10 Essential (primary) hypertension: Secondary | ICD-10-CM

## 2021-11-13 ENCOUNTER — Other Ambulatory Visit: Payer: Self-pay | Admitting: Internal Medicine

## 2022-01-21 ENCOUNTER — Other Ambulatory Visit: Payer: Self-pay | Admitting: Internal Medicine

## 2022-01-31 ENCOUNTER — Other Ambulatory Visit: Payer: Self-pay | Admitting: Internal Medicine

## 2022-01-31 DIAGNOSIS — I10 Essential (primary) hypertension: Secondary | ICD-10-CM

## 2022-02-08 ENCOUNTER — Other Ambulatory Visit: Payer: Self-pay | Admitting: Internal Medicine

## 2022-02-17 ENCOUNTER — Other Ambulatory Visit: Payer: Self-pay | Admitting: Family

## 2022-02-17 NOTE — Telephone Encounter (Signed)
Medication Refill - Medication: irbesartan (AVAPRO) 300 MG tablet ? ?Has the patient contacted their pharmacy? Yes.   ?(Agent: If no, request that the patient contact the pharmacy for the refill. If patient does not wish to contact the pharmacy document the reason why and proceed with request.) ?(Agent: If yes, when and what did the pharmacy advise?) ? ?Preferred Pharmacy (with phone number or street name):  ?[id number] [address]  [id number] ?Phone: [phone]: [phone] ? ?Has the patient been seen for an appointment in the last year OR does the patient have an upcoming appointment? Yes.   ? ?Agent: Please be advised that RX refills may take up to 3 business days. We ask that you follow-up with your pharmacy. ? ?

## 2022-02-18 NOTE — Telephone Encounter (Signed)
Requested medication (s) are due for refill today: expired medication ? ?Requested medication (s) are on the active medication list: yes  ? ?Last refill:  11/04/20 #90 3 refills  ? ?Future visit scheduled: yes in 1 week  ? ?Notes to clinic:  expired medication . Last ordered by Lyman Bishop, MD  ? ? ?  ?Requested Prescriptions  ?Pending Prescriptions Disp Refills  ? irbesartan (AVAPRO) 300 MG tablet 90 tablet 3  ?  Sig: TAKE 1 TABLET BY MOUTH EVERY DAY  ?  ? Cardiovascular:  Angiotensin Receptor Blockers Failed - 02/17/2022  5:42 PM  ?  ?  Failed - Cr in normal range and within 180 days  ?  Creatinine, Ser  ?Date Value Ref Range Status  ?01/25/2019 1.19 0.76 - 1.27 mg/dL Final  ?  ?  ?  ?  Failed - K in normal range and within 180 days  ?  Potassium  ?Date Value Ref Range Status  ?01/25/2019 3.6 3.5 - 5.2 mmol/L Final  ?  ?  ?  ?  Failed - Valid encounter within last 6 months  ?  Recent Outpatient Visits   ? ?      ? 1 year ago Encounter to establish care  ? Primary Care at [facility], Bayard Beaver, MD  ? 5 years ago Pre-operative clearance  ? Primary Care at [facility], Arlie Solomons, MD  ? ?  ?  ?Future Appointments   ? ?        ? In 1 week Camillia Herter, NP Primary Care at [facility]  ? ?  ? ?  ?  ?  Passed - Patient is not pregnant  ?  ?  Passed - Last BP in normal range  ?  BP Readings from Last 1 Encounters:  ?03/13/21 124/62  ?  ?  ?  ?  ? ?

## 2022-02-19 ENCOUNTER — Other Ambulatory Visit: Payer: Self-pay | Admitting: Internal Medicine

## 2022-02-27 NOTE — Progress Notes (Signed)
? ? ?[name], male    DOB: 09-28-1961  MRN: [id number] ? ?CC: Hypertension Follow-Up ? ?Subjective: ?[name] is a 61 y.o. male who presents for hypertension follow-up.  ? ?His concerns today include:  ?HYPERTENSION: ?Currently taking: see medication list ?Med Adherence: '[x]'$  Yes    '[]'$  No ?Medication side effects: '[]'$  Yes    '[x]'$  No ?Adherence with salt restriction (low-salt diet): trying ?Exercise: Yes '[]'$  No '[x]'$  Recent back surgery  ?Home Monitoring?: '[x]'$  Yes  140's/80's-90's ?Smoking: Cigars occasionally   ?SOB? '[]'$  Yes    '[x]'$  No ?Chest Pain?: '[]'$  Yes    '[x]'$  No ?Comments: Last appointment 12/31/2020 with Phill Myron, DO. Reports prior to the pandemic was followed by Cardiology. States during the pandemic Cardiology office closed and has not followed-up since then. Expresses that he does not enjoy doctor's office appointments and prefers consistency. Requesting handicap placard completion.  ? ? ?Patient Active Problem List  ? Diagnosis Date Noted  ? Deep vein thrombosis (DVT) of distal vein of left lower extremity ([place]) 07/11/2018  ? Pain and swelling of lower extremity 03/22/2017  ? Acute deep vein thrombosis (DVT) of femoral vein of left lower extremity ([place]) 03/22/2017  ? Prerenal azotemia 12/09/2016  ? Lumbar radiculopathy 12/04/2016  ? Weakness of both legs   ? Prediabetes   ? Constipation due to pain medication   ? Bradycardia 07/17/2014  ? Essential hypertension 07/17/2014  ? Syncope 07/15/2014  ?  ? ?Current Outpatient Medications on File Prior to Visit  ?Medication Sig Dispense Refill  ? amLODipine (NORVASC) 10 MG tablet TAKE 1 TABLET BY MOUTH EVERY DAY 90 tablet 0  ? Multiple Vitamins-Minerals (MULTIVITAMIN WITH MINERALS) tablet Take 1 tablet by mouth daily.    ? ?No current facility-administered medications on file prior to visit.  ? ? ?No Known Allergies ? ?Social History  ? ?Socioeconomic History  ? Marital status: Married  ?  Spouse name: Not on file  ? Number of children: Not on file   ? Years of education: Not on file  ? Highest education level: Not on file  ?Occupational History  ? Not on file  ?Tobacco Use  ? Smoking status: Light Smoker  ?  Types: Cigars  ? Smokeless tobacco: Never  ? Tobacco comments:  ?  from time to time   ?Vaping Use  ? Vaping Use: Never used  ?Substance and Sexual Activity  ? Alcohol use: Yes  ?  Comment: drinks beer socially at times   ? Drug use: No  ? Sexual activity: Never  ?Other Topics Concern  ? Not on file  ?Social History Narrative  ? Not on file  ? ?Social Determinants of Health  ? ?Financial Resource Strain: Not on file  ?Food Insecurity: Not on file  ?Transportation Needs: Not on file  ?Physical Activity: Not on file  ?Stress: Not on file  ?Social Connections: Not on file  ?Intimate Partner Violence: Not on file  ? ? ?Family History  ?Problem Relation Age of Onset  ? Diabetes Mother   ? Hypertension Mother   ? Diabetes Father   ? Hypertension Father   ? Colon cancer Neg Hx   ? Colon polyps Neg Hx   ? Esophageal cancer Neg Hx   ? Rectal cancer Neg Hx   ? Stomach cancer Neg Hx   ? ? ?Past Surgical History:  ?Procedure Laterality Date  ? LAPAROSCOPIC GASTRIC SLEEVE RESECTION    ? LUMBAR LAMINECTOMY  12/01/2016  ?  Lumbar decompression L2-5 LUMBAR LAMINECTOMY/DECOMPRESSION MICRODISCECTOMY 3 LEVELS (N/A)  ? LUMBAR LAMINECTOMY/DECOMPRESSION MICRODISCECTOMY N/A 12/01/2016  ? Procedure: Lumbar decompression L2-5 LUMBAR LAMINECTOMY/DECOMPRESSION MICRODISCECTOMY 3 LEVELS;  Surgeon: Melina Schools, MD;  Location: [place];  Service: Orthopedics;  Laterality: N/A;  ? [place] SURGERY N/A 2018  ? 02/20/21- pt reports spinal cord injury during laminectemy , pt uses walker now  ? ? ?ROS: ?Review of Systems ?Negative except as stated above ? ?PHYSICAL EXAM: ?BP (!) 149/89   Pulse 62   Temp 98.5 ?F (36.9 ?C)   Resp 18   Ht '6\' 7"'$  (2.007 m)   Wt 300 lb (136.1 kg)   SpO2 97%   BMI 33.80 kg/m?  ? ?Physical Exam ?HENT:  ?   Head: Normocephalic and atraumatic.  ?Eyes:  ?   Extraocular  Movements: Extraocular movements intact.  ?   Conjunctiva/sclera: Conjunctivae normal.  ?   Pupils: Pupils are equal, round, and reactive to light.  ?Cardiovascular:  ?   Rate and Rhythm: Normal rate and regular rhythm.  ?   Pulses: Normal pulses.  ?   Heart sounds: Normal heart sounds.  ?Pulmonary:  ?   Effort: Pulmonary effort is normal.  ?   Breath sounds: Normal breath sounds.  ?Musculoskeletal:  ?   Cervical back: Normal range of motion and neck supple.  ?Neurological:  ?   General: No focal deficit present.  ?   Mental Status: He is alert and oriented to person, place, and time.  ?Psychiatric:     ?   Mood and Affect: Mood normal.     ?   Behavior: Behavior normal.  ? ?ASSESSMENT AND PLAN: ?1. Essential (primary) hypertension: ?- Blood pressure not at goal during today's visit. Patient asymptomatic without chest pressure, chest pain, palpitations, shortness of breath, worst headache of life, and any additional red flag symptoms. ?- Irbesartan, Chlorthalidone, and Hydralazine as prescribed.  ?- Amlodipine as prescribed. No refills needed as of present.  ?- Counseled on blood pressure goal of less than 130/80, low-sodium, DASH diet, medication compliance, and 150 minutes of moderate intensity exercise per week as tolerated. Counseled on medication adherence and adverse effects. ?- BMP to evaluate kidney function and electrolyte balance. ?- Referral to Cardiology for further evaluation and management.  ?- Follow-up with primary provider as scheduled.  ?- Basic Metabolic Panel ?- irbesartan (AVAPRO) 300 MG tablet; TAKE 1 TABLET BY MOUTH EVERY DAY  Dispense: 30 tablet; Refill: 2 ?- chlorthalidone (HYGROTON) 25 MG tablet; Take one tablet by mouth daily. SCHEDULE OFFICE VISIT FOR FUTURE REFILLS.  Dispense: 30 tablet; Refill: 1 ?- hydrALAZINE (APRESOLINE) 25 MG tablet; Take 1 tablet (25 mg total) by mouth 2 (two) times daily.  Dispense: 60 tablet; Refill: 1 ?- Ambulatory referral to Cardiology ? ?2. Encounter for  completion of form with patient: ?- Handicap Placard completed today in office. ? ? ?Patient was given the opportunity to ask questions.  Patient verbalized understanding of the plan and was able to repeat key elements of the plan. Patient was given clear instructions to go to Emergency Department or return to medical center if symptoms don't improve, worsen, or new problems develop.The patient verbalized understanding. ? ? ?Orders Placed This Encounter  ?Procedures  ? Basic Metabolic Panel  ? Ambulatory referral to Cardiology  ? ? ? ?Requested Prescriptions  ? ?Signed Prescriptions Disp Refills  ? irbesartan (AVAPRO) 300 MG tablet 30 tablet 2  ?  Sig: TAKE 1 TABLET BY MOUTH EVERY DAY  ? chlorthalidone (  HYGROTON) 25 MG tablet 30 tablet 1  ?  Sig: Take one tablet by mouth daily. SCHEDULE OFFICE VISIT FOR FUTURE REFILLS.  ? hydrALAZINE (APRESOLINE) 25 MG tablet 60 tablet 1  ?  Sig: Take 1 tablet (25 mg total) by mouth 2 (two) times daily.  ? ? ?Return in about 1 month (around 04/01/2022) for Follow-Up or next available hypertension Dorna Mai, MD. ? ?Camillia Herter, NP  ?

## 2022-03-02 ENCOUNTER — Encounter: Payer: Self-pay | Admitting: Family

## 2022-03-02 ENCOUNTER — Ambulatory Visit (INDEPENDENT_AMBULATORY_CARE_PROVIDER_SITE_OTHER): Payer: Medicare HMO | Admitting: Family

## 2022-03-02 VITALS — BP 149/89 | HR 62 | Temp 98.5°F | Resp 18 | Ht 79.0 in | Wt 300.0 lb

## 2022-03-02 DIAGNOSIS — I10 Essential (primary) hypertension: Secondary | ICD-10-CM | POA: Diagnosis not present

## 2022-03-02 DIAGNOSIS — Z0289 Encounter for other administrative examinations: Secondary | ICD-10-CM | POA: Diagnosis not present

## 2022-03-02 MED ORDER — CHLORTHALIDONE 25 MG PO TABS: ORAL_TABLET | ORAL | 1 refills | Status: DC

## 2022-03-02 MED ORDER — IRBESARTAN 300 MG PO TABS: ORAL_TABLET | ORAL | 2 refills | Status: DC

## 2022-03-02 MED ORDER — HYDRALAZINE HCL 25 MG PO TABS: 25.0000 mg | ORAL_TABLET | Freq: Two times a day (BID) | ORAL | 1 refills | Status: DC

## 2022-03-02 NOTE — Progress Notes (Signed)
Pt presents to hypertension follow-up  ?Need Ibersartan refilled been out about 1 month  ?

## 2022-03-03 LAB — BASIC METABOLIC PANEL
BUN/Creatinine Ratio: 13 (ref 10–24)
BUN: 15 mg/dL (ref 8–27)
CO2: 26 mmol/L (ref 20–29)
Calcium: 9.4 mg/dL (ref 8.6–10.2)
Chloride: 101 mmol/L (ref 96–106)
Creatinine, Ser: 1.15 mg/dL (ref 0.76–1.27)
Glucose: 100 mg/dL — ABNORMAL HIGH (ref 70–99)
Potassium: 3.7 mmol/L (ref 3.5–5.2)
Sodium: 141 mmol/L (ref 134–144)
eGFR: 72 mL/min/{1.73_m2} (ref >59)

## 2022-03-03 NOTE — Progress Notes (Signed)
Kidney function and electrolytes normal.

## 2022-03-06 ENCOUNTER — Other Ambulatory Visit: Payer: Self-pay | Admitting: Internal Medicine

## 2022-03-06 DIAGNOSIS — I10 Essential (primary) hypertension: Secondary | ICD-10-CM

## 2022-03-07 NOTE — Progress Notes (Signed)
?Cardiology Office Note:   ?Date:  03/10/2022  ?NAME:  [name]    ?MRN: [id number] ?DOB:  [birth year]  ? ?PCP:  Nicolette Bang, MD  ?Cardiologist:  None  ?Electrophysiologist:  None  ? ?Referring MD: Camillia Herter, NP  ? ?Chief Complaint  ?Patient presents with  ? New Patient (Initial Visit)  ? ?History of Present Illness:   ?[name] is a 61 y.o. male with a hx of HTN who is being seen today for the evaluation of abnormal EKG at the request of Nicolette Bang, MD. he presents for follow-up to our practice.  Has not been seen in several years.  He has hypertension that is well controlled medication.  History of DVT but this was related to surgery.  Also has history of gastric bypass surgery.  He reports he is done well.  He is not that active due to prior spinal surgery.  No limitations such as chest pain or trouble breathing.  His diabetes is well controlled.  His EKG demonstrates normal sinus rhythm with no acute ischemic changes or evidence of infarction.  Overall he seems to be doing quite well and denies any major symptoms in office.  He smokes cigars.  No alcohol use in excess.  No drug use is reported.  He is currently retired.  He is married with 2 children.  No strong family history of heart disease.  EKG demonstrates sinus rhythm. ? ?Problem List ?HTN ?DVT ? ?Past Medical History: ?Past Medical History:  ?Diagnosis Date  ? Allergy   ? pollen  ? Arthritis   ? Diabetes mellitus without complication ([place]) Resolved 2014 after weight loss  ? Hyperlipidemia Resolved 2014 after weight loss  ? Hypertension   ? Neuromuscular disorder ([place]) 2018  ? pt states spinal cord injury due to spinal surgery, pt uses walker for mobility as of 02/20/21.  ? Weight loss   ? ? ?Past Surgical History: ?Past Surgical History:  ?Procedure Laterality Date  ? LAPAROSCOPIC GASTRIC SLEEVE RESECTION    ? LUMBAR LAMINECTOMY  12/01/2016  ? Lumbar decompression L2-5 LUMBAR LAMINECTOMY/DECOMPRESSION MICRODISCECTOMY  3 LEVELS (N/A)  ? LUMBAR LAMINECTOMY/DECOMPRESSION MICRODISCECTOMY N/A 12/01/2016  ? Procedure: Lumbar decompression L2-5 LUMBAR LAMINECTOMY/DECOMPRESSION MICRODISCECTOMY 3 LEVELS;  Surgeon: Melina Schools, MD;  Location: [place];  Service: Orthopedics;  Laterality: N/A;  ? [place] SURGERY N/A 2018  ? 02/20/21- pt reports spinal cord injury during laminectemy , pt uses walker now  ? ? ?Current Medications: ?Current Meds  ?Medication Sig  ? amLODipine (NORVASC) 10 MG tablet TAKE 1 TABLET BY MOUTH EVERY DAY  ? chlorthalidone (HYGROTON) 25 MG tablet Take one tablet by mouth daily. SCHEDULE OFFICE VISIT FOR FUTURE REFILLS.  ? hydrALAZINE (APRESOLINE) 25 MG tablet Take 1 tablet (25 mg total) by mouth 2 (two) times daily.  ? irbesartan (AVAPRO) 300 MG tablet TAKE 1 TABLET BY MOUTH EVERY DAY  ? Multiple Vitamins-Minerals (MULTIVITAMIN WITH MINERALS) tablet Take 1 tablet by mouth daily.  ?  ? ?Allergies:    ?Patient has no known allergies.  ? ?Social History: ?Social History  ? ?Socioeconomic History  ? Marital status: Married  ?  Spouse name: Not on file  ? Number of children: 2  ? Years of education: Not on file  ? Highest education level: Not on file  ?Occupational History  ? Occupation: Retired  ?Tobacco Use  ? Smoking status: Some Days  ?  Types: Cigars  ? Smokeless tobacco: Never  ? Tobacco comments:  ?  from time to time   ?  Maybe twice a month  ?Vaping Use  ? Vaping Use: Never used  ?Substance and Sexual Activity  ? Alcohol use: Yes  ?  Comment: drinks beer socially at times   ? Drug use: No  ? Sexual activity: Never  ?Other Topics Concern  ? Not on file  ?Social History Narrative  ? Not on file  ? ?Social Determinants of Health  ? ?Financial Resource Strain: Not on file  ?Food Insecurity: Not on file  ?Transportation Needs: Not on file  ?Physical Activity: Not on file  ?Stress: Not on file  ?Social Connections: Not on file  ?  ? ?Family History: ?The patient's family history includes Diabetes in his father and mother;  Hypertension in his father and mother. There is no history of Colon cancer, Colon polyps, Esophageal cancer, Rectal cancer, or Stomach cancer. ? ?ROS:   ?All other ROS reviewed and negative. Pertinent positives noted in the HPI.    ? ?EKGs/Labs/Other Studies Reviewed:   ?The following studies were personally reviewed by me today: ? ?EKG:  EKG is ordered today.  The ekg ordered today demonstrates NSR 74 bpm, no acute changes, no infarction, and was personally reviewed by me.  ? ?Recent Labs: ?03/02/2022: BUN 15; Creatinine, Ser 1.15; Potassium 3.7; Sodium 141  ? ?Recent Lipid Panel ?   ?Component Value Date/Time  ? CHOL 149 07/16/2014 0535  ? TRIG 59 07/16/2014 0535  ? HDL 77 07/16/2014 0535  ? CHOLHDL 1.9 07/16/2014 0535  ? VLDL 12 07/16/2014 0535  ? [place] 60 07/16/2014 0535  ? ? ?Physical Exam:   ?VS:  BP 132/84 (BP Location: Left Arm, Patient Position: Sitting, Cuff Size: Large)   Pulse 74   Ht '6\' 7"'$  (2.007 m)   Wt (!) 308 lb 3.2 oz (139.8 kg)   SpO2 97%   BMI 34.72 kg/m?    ?Wt Readings from Last 3 Encounters:  ?03/10/22 (!) 308 lb 3.2 oz (139.8 kg)  ?03/02/22 300 lb (136.1 kg)  ?03/13/21 293 lb (132.9 kg)  ?  ?General: Well nourished, well developed, in no acute distress ?Head: Atraumatic, normal size  ?Eyes: PEERLA, EOMI  ?Neck: Supple, no JVD ?Endocrine: No thryomegaly ?Cardiac: Normal S1, S2; RRR; no murmurs, rubs, or gallops ?Lungs: Clear to auscultation bilaterally, no wheezing, rhonchi or rales  ?Abd: Soft, nontender, no hepatomegaly  ?Ext: trace edema  ?Musculoskeletal: No deformities, BUE and BLE strength normal and equal ?Skin: Warm and dry, no rashes   ?Neuro: Alert and oriented to person, place, time, and situation, CNII-XII grossly intact, no focal deficits  ?Psych: Normal mood and affect  ? ?ASSESSMENT:   ?[name] is a 61 y.o. male who presents for the following: ?1. Nonspecific abnormal electrocardiogram (ECG) (EKG)   ?2. Primary hypertension   ? ? ?PLAN:   ?1. Nonspecific abnormal  electrocardiogram (ECG) (EKG) ?2. Primary hypertension ?-BP well controlled.  EKG demonstrates sinus rhythm with no acute ischemic changes.  He is without any cardiac complaints.  He does have some trace edema on exam but this is controlled with chlorthalidone.  All of his other risk factors seem to be well controlled.  Given his lack of symptoms he can see [place] back as needed.  Would recommend to continue his current blood pressure regimen as this is working well. ? ? ?  ? ?Disposition: Return if symptoms worsen or fail to improve. ? ?Medication Adjustments/Labs and Tests Ordered: ?Current medicines are reviewed at  length with the patient today.  Concerns regarding medicines are outlined above.  ?Orders Placed This Encounter  ?Procedures  ? EKG 12-Lead  ? ?No orders of the defined types were placed in this encounter. ? ? ?Patient Instructions  ?Medication Instructions:  ?The current medical regimen is effective;  continue present plan and medications. ? ?*If you need a refill on your cardiac medications before your next appointment, please call your pharmacy* ? ? ?Follow-Up: ?At [facility], you and your health needs are our priority.  As part of our continuing mission to provide you with exceptional heart care, we have created designated Provider Care Teams.  These Care Teams include your primary Cardiologist (physician) and Advanced Practice Providers (APPs -  Physician Assistants and Nurse Practitioners) who all work together to provide you with the care you need, when you need it. ? ?We recommend signing up for the patient portal called "MyChart".  Sign up information is provided on this After Visit Summary.  MyChart is used to connect with patients for Virtual Visits (Telemedicine).  Patients are able to view lab/test results, encounter notes, upcoming appointments, etc.  Non-urgent messages can be sent to your provider as well.   ?To learn more about what you can do with MyChart, go to  [email or web address].   ? ?Your next appointment:   ?As needed ? ?The format for your next appointment:   ?In Person ? ?Provider:   ?Eleonore Chiquito, MD  ? ?Important Information About Sugar ? ? ? ? ? ?  ? ?Time Spent with Patient:

## 2022-03-10 ENCOUNTER — Ambulatory Visit: Payer: Medicare HMO | Admitting: Cardiovascular Disease

## 2022-03-10 ENCOUNTER — Encounter: Payer: Self-pay | Admitting: Cardiovascular Disease

## 2022-03-10 VITALS — BP 132/84 | HR 74 | Ht 79.0 in | Wt 308.2 lb

## 2022-03-10 DIAGNOSIS — I10 Essential (primary) hypertension: Secondary | ICD-10-CM

## 2022-03-10 DIAGNOSIS — R9431 Abnormal electrocardiogram [ECG] [EKG]: Secondary | ICD-10-CM | POA: Diagnosis not present

## 2022-03-10 NOTE — Patient Instructions (Signed)
Medication Instructions:  ?The current medical regimen is effective;  continue present plan and medications. ? ?*If you need a refill on your cardiac medications before your next appointment, please call your pharmacy* ? ? ?Follow-Up: ?At [facility], you and your health needs are our priority.  As part of our continuing mission to provide you with exceptional heart care, we have created designated Provider Care Teams.  These Care Teams include your primary Cardiologist (physician) and Advanced Practice Providers (APPs -  Physician Assistants and Nurse Practitioners) who all work together to provide you with the care you need, when you need it. ? ?We recommend signing up for the patient portal called "MyChart".  Sign up information is provided on this After Visit Summary.  MyChart is used to connect with patients for Virtual Visits (Telemedicine).  Patients are able to view lab/test results, encounter notes, upcoming appointments, etc.  Non-urgent messages can be sent to your provider as well.   ?To learn more about what you can do with MyChart, go to [email or web address].   ? ?Your next appointment:   ?As needed ? ?The format for your next appointment:   ?In Person ? ?Provider:   ?Eleonore Chiquito, MD  ? ?Important Information About Sugar ? ? ? ? ? ? ?

## 2022-03-17 ENCOUNTER — Ambulatory Visit: Payer: Medicare HMO | Admitting: Family

## 2022-03-29 ENCOUNTER — Other Ambulatory Visit: Payer: Self-pay | Admitting: Family

## 2022-03-29 DIAGNOSIS — I10 Essential (primary) hypertension: Secondary | ICD-10-CM

## 2022-04-08 ENCOUNTER — Encounter: Payer: Self-pay | Admitting: Internal Medicine

## 2022-05-04 ENCOUNTER — Telehealth: Payer: Self-pay | Admitting: Internal Medicine

## 2022-05-04 NOTE — Telephone Encounter (Signed)
Spoke w/ pt, informed appt was needed and scheduled next available July 10th at 10am w/ Dr. Redmond Pulling. Pt agreed. Pt has paperwork. /    Copied from [place] [phone]. Topic: General - Other >> May 03, 2022  4:53 PM [name] wrote: Reason for CRM:pt called back to see if he can bring disbility papers by to be filled out. Please call back

## 2022-05-15 ENCOUNTER — Other Ambulatory Visit: Payer: Self-pay | Admitting: Family

## 2022-05-15 DIAGNOSIS — I10 Essential (primary) hypertension: Secondary | ICD-10-CM

## 2022-05-21 ENCOUNTER — Ambulatory Visit (INDEPENDENT_AMBULATORY_CARE_PROVIDER_SITE_OTHER): Payer: Medicare HMO

## 2022-05-21 DIAGNOSIS — Z0000 Encounter for general adult medical examination without abnormal findings: Secondary | ICD-10-CM | POA: Diagnosis not present

## 2022-05-21 NOTE — Progress Notes (Signed)
I connected with  [name] on 05/21/22 by a audio enabled telemedicine application and verified that I am speaking with the correct person using two identifiers.  Patient Location: Home  Provider Location: Home Office  I discussed the limitations of evaluation and management by telemedicine. The patient expressed understanding and agreed to proceed.  Subjective:   [name] is a 61 y.o. male who presents for an Initial Medicare Annual Wellness Visit.        Objective:    Today's Vitals   There is no height or weight on file to calculate BMI.     12/31/2020    3:16 PM 01/05/2017    9:40 AM 12/04/2016    6:52 PM 12/01/2016    4:41 PM 11/25/2016   10:27 AM 07/15/2014   11:38 PM  Advanced Directives  Does Patient Have a Medical Advance Directive? No No Yes No No No  Would patient like information on creating a medical advance directive? No - Patient declined  No - Patient declined No - Patient declined Yes (MAU/Ambulatory/Procedural Areas - Information given) No - patient declined information    Current Medications (verified) Outpatient Encounter Medications as of 05/21/2022  Medication Sig   amLODipine (NORVASC) 10 MG tablet TAKE 1 TABLET BY MOUTH EVERY DAY   chlorthalidone (HYGROTON) 25 MG tablet Take one tablet by mouth daily.   hydrALAZINE (APRESOLINE) 25 MG tablet Take 1 tablet (25 mg total) by mouth 2 (two) times daily.   irbesartan (AVAPRO) 300 MG tablet TAKE 1 TABLET BY MOUTH EVERY DAY   Multiple Vitamins-Minerals (MULTIVITAMIN WITH MINERALS) tablet Take 1 tablet by mouth daily.   No facility-administered encounter medications on file as of 05/21/2022.    Allergies (verified) Patient has no known allergies.   History: Past Medical History:  Diagnosis Date   Allergy    pollen   Arthritis    Diabetes mellitus without complication ([place]) Resolved 2014 after weight loss   Hyperlipidemia Resolved 2014 after weight loss   Hypertension    Neuromuscular disorder  ([place]) 2018   pt states spinal cord injury due to spinal surgery, pt uses walker for mobility as of 02/20/21.   Weight loss    Past Surgical History:  Procedure Laterality Date   LAPAROSCOPIC GASTRIC SLEEVE RESECTION     LUMBAR LAMINECTOMY  12/01/2016   Lumbar decompression L2-5 LUMBAR LAMINECTOMY/DECOMPRESSION MICRODISCECTOMY 3 LEVELS (N/A)   LUMBAR LAMINECTOMY/DECOMPRESSION MICRODISCECTOMY N/A 12/01/2016   Procedure: Lumbar decompression L2-5 LUMBAR LAMINECTOMY/DECOMPRESSION MICRODISCECTOMY 3 LEVELS;  Surgeon: Melina Schools, MD;  Location: [place];  Service: Orthopedics;  Laterality: N/A;   SPINE SURGERY N/A 2018   02/20/21- pt reports spinal cord injury during laminectemy , pt uses walker now   Family History  Problem Relation Age of Onset   Diabetes Mother    Hypertension Mother    Diabetes Father    Hypertension Father    Colon cancer Neg Hx    Colon polyps Neg Hx    Esophageal cancer Neg Hx    Rectal cancer Neg Hx    Stomach cancer Neg Hx    Social History   Socioeconomic History   Marital status: Married    Spouse name: Not on file   Number of children: 2   Years of education: Not on file   Highest education level: Not on file  Occupational History   Occupation: Retired  Tobacco Use   Smoking status: Some Days    Types: Cigars   Smokeless tobacco: Never  Tobacco comments:    from time to time     Maybe twice a month  Vaping Use   Vaping Use: Never used  Substance and Sexual Activity   Alcohol use: Yes    Comment: drinks beer socially at times    Drug use: No   Sexual activity: Never  Other Topics Concern   Not on file  Social History Narrative   Not on file   Social Determinants of Health   Financial Resource Strain: Low Risk  (05/21/2022)   Overall Financial Resource Strain (CARDIA)    Difficulty of Paying Living Expenses: Not very hard  Food Insecurity: No Food Insecurity (05/21/2022)   Hunger Vital Sign    Worried About Running Out of Food in the Last  Year: Never true    Ran Out of Food in the Last Year: Never true  Transportation Needs: No Transportation Needs (05/21/2022)   PRAPARE - Hydrologist (Medical): No    Lack of Transportation (Non-Medical): No  Physical Activity: Inactive (05/21/2022)   Exercise Vital Sign    Days of Exercise per Week: 0 days    Minutes of Exercise per Session: 0 min  Stress: No Stress Concern Present (05/21/2022)   [place]    Feeling of Stress : Not at all  Social Connections: Moderately Isolated (05/21/2022)   Social Connection and Isolation Panel [NHANES]    Frequency of Communication with Friends and Family: More than three times a week    Frequency of Social Gatherings with Friends and Family: More than three times a week    Attends Religious Services: Never    Marine scientist or Organizations: No    Attends Music therapist: Never    Marital Status: Married    Tobacco Counseling Ready to quit: Not Answered Counseling given: Not Answered Tobacco comments: from time to time  Maybe twice a month   Clinical Intake:  Pre-visit preparation completed: Yes  Pain : No/denies pain     Nutritional Risks: None Diabetes: No  How often do you need to have someone help you when you read instructions, pamphlets, or other written materials from your doctor or pharmacy?: 1 - Never What is the last grade level you completed in school?: college  Diabetic?no  Interpreter Needed?: No  Information entered by :: Y.Laityn Bensen RMA   Activities of Daily Living    05/21/2022    1:09 PM 05/17/2022   12:17 PM  In your present state of health, do you have any difficulty performing the following activities:  Hearing? 0 0  Vision? 0 0  Difficulty concentrating or making decisions? 0 0  Walking or climbing stairs? 1 1  Dressing or bathing? 0 0  Doing errands, shopping? 0 0  Preparing Food and  eating ?  N  Using the Toilet?  N  In the past six months, have you accidently leaked urine?  N  Do you have problems with loss of bowel control?  N  Managing your Medications?  N  Managing your Finances?  N  Housekeeping or managing your Housekeeping?  N    Patient Care Team: Dorna Mai, MD as PCP - General (Family Medicine)  Indicate any recent Medical Services you may have received from other than [facility] providers in the past year (date may be approximate).     Assessment:   This is a routine wellness examination for A[place].  Hearing/Vision screen  No results found.  Dietary issues and exercise activities discussed:     Goals Addressed   None   Depression Screen    05/21/2022   12:59 PM 03/02/2022    1:39 PM 12/31/2020    3:13 PM 01/05/2017    9:41 AM 11/03/2016    4:17 PM  PHQ 2/9 Scores  PHQ - 2 Score 0 0 0 0 0  PHQ- 9 Score   0 0     Fall Risk    05/17/2022   12:17 PM 12/31/2020    3:13 PM 01/05/2017    9:41 AM 11/03/2016    4:17 PM  Fall Risk   Falls in the past year? 1 0 No No  Number falls in past yr: 0 0    Injury with Fall? 0 0      FALL RISK PREVENTION PERTAINING TO THE HOME:  Any stairs in or around the home? Yes  If so, are there any without handrails? Yes  Home free of loose throw rugs in walkways, pet beds, electrical cords, etc? No  Adequate lighting in your home to reduce risk of falls? Yes   ASSISTIVE DEVICES UTILIZED TO PREVENT FALLS:  Life alert? No  Use of a cane, walker or w/c? Yes  Grab bars in the bathroom? Yes  Shower chair or bench in shower? Yes  Elevated toilet seat or a handicapped toilet? Yes    Cognitive Function:        Immunizations Immunization History  Administered Date(s) Administered   Moderna Sars-Covid-2 Vaccination 01/28/2020, 02/25/2020, 10/22/2020   Tdap 11/23/2015    TDAP status: Up to date  Flu Vaccine status: Up to date   Covid-19 vaccine status: Completed vaccines  Qualifies for Shingles  Vaccine? Yes   Zostavax completed No   Shingrix Completed?: No.    Education has been provided regarding the importance of this vaccine. Patient has been advised to call insurance company to determine out of pocket expense if they have not yet received this vaccine. Advised may also receive vaccine at local pharmacy or Health Dept. Verbalized acceptance and understanding.  Screening Tests Health Maintenance  Topic Date Due   HIV Screening  Never done   Hepatitis C Screening  Never done   Zoster Vaccines- Shingrix (1 of 2) Never done   COVID-19 Vaccine (4 - Moderna series) 12/17/2020   COLONOSCOPY (Pts 45-68yr Insurance coverage will need to be confirmed)  03/13/2022   INFLUENZA VACCINE  06/22/2022   TETANUS/TDAP  11/22/2025   HPV VACCINES  Aged Out    Health Maintenance  Health Maintenance Due  Topic Date Due   HIV Screening  Never done   Hepatitis C Screening  Never done   Zoster Vaccines- Shingrix (1 of 2) Never done   COVID-19 Vaccine (4 - Moderna series) 12/17/2020   COLONOSCOPY (Pts 45-410yrInsurance coverage will need to be confirmed)  03/13/2022    Colorectal cancer screening: Type of screening: Colonoscopy. Completed 03/13/21. Repeat every 10 years  Lung Cancer Screening: (Low Dose CT Chest recommended if Age 62-80ears, 30 pack-year currently smoking OR have quit w/in 15years.) does not qualify.   Lung Cancer Screening Referral: n/a  Additional Screening:  Hepatitis C Screening: does qualify; Completed n/a  Vision Screening: Recommended annual ophthalmology exams for early detection of glaucoma and other disorders of the eye. Is the patient up to date with their annual eye exam?  Yes  Who is the provider or what is the name of the office  in which the patient attends annual eye exams? [facility] If pt is not established with a provider, would they like to be referred to a provider to establish care? No .   Dental Screening: Recommended annual dental exams for  proper oral hygiene  Community Resource Referral / Chronic Care Management: CRR required this visit?  No   CCM required this visit?  No      Plan:     I have personally reviewed and noted the following in the patient's chart:   Medical and social history Use of alcohol, tobacco or illicit drugs  Current medications and supplements including opioid prescriptions. Patient is not currently taking opioid prescriptions. Functional ability and status Nutritional status Physical activity Advanced directives List of other physicians Hospitalizations, surgeries, and ER visits in previous 12 months Vitals Screenings to include cognitive, depression, and falls Referrals and appointments  In addition, I have reviewed and discussed with patient certain preventive protocols, quality metrics, and best practice recommendations. A written personalized care plan for preventive services as well as general preventive health recommendations were provided to patient.     [place], CMA   05/21/2022   Nurse Notes: Patient needs Hep C screening, repeat colonscopy and hiv screening at is next visit. YL,RMA

## 2022-05-31 ENCOUNTER — Ambulatory Visit (INDEPENDENT_AMBULATORY_CARE_PROVIDER_SITE_OTHER): Payer: Medicare HMO | Admitting: Family Medicine

## 2022-05-31 ENCOUNTER — Encounter: Payer: Self-pay | Admitting: Family Medicine

## 2022-05-31 VITALS — BP 148/91 | HR 61 | Temp 98.1°F | Resp 16 | Wt 310.0 lb

## 2022-05-31 DIAGNOSIS — M5416 Radiculopathy, lumbar region: Secondary | ICD-10-CM

## 2022-05-31 DIAGNOSIS — I10 Essential (primary) hypertension: Secondary | ICD-10-CM

## 2022-05-31 NOTE — Progress Notes (Unsigned)
Patient came in for disability paper work to be fill out  Patient had no other concerns today

## 2022-06-01 ENCOUNTER — Encounter: Payer: Self-pay | Admitting: Family Medicine

## 2022-06-01 NOTE — Progress Notes (Signed)
Established Patient Office Visit  Subjective    [name], male    DOB: 1961/10/17  Age: 61 y.o. MRN: [id number]  CC:  Chief Complaint  Patient presents with   Disability paper    HPI [name] presents to establish care with a new provider as well as review of his chronic med issues including hypertension and leg weakness. Patient denies acute complaints or concerns. He is on longterm disability for back pain/leg weakness.    Outpatient Encounter Medications as of 05/31/2022  Medication Sig   amLODipine (NORVASC) 10 MG tablet TAKE 1 TABLET BY MOUTH EVERY DAY   chlorthalidone (HYGROTON) 25 MG tablet Take one tablet by mouth daily.   irbesartan (AVAPRO) 300 MG tablet TAKE 1 TABLET BY MOUTH EVERY DAY   Multiple Vitamins-Minerals (MULTIVITAMIN WITH MINERALS) tablet Take 1 tablet by mouth daily.   [DISCONTINUED] hydrochlorothiazide (HYDRODIURIL) 25 MG tablet Take 1 tablet by mouth daily.   [DISCONTINUED] hydrALAZINE (APRESOLINE) 25 MG tablet Take 1 tablet (25 mg total) by mouth 2 (two) times daily. (Patient not taking: Reported on 05/31/2022)   No facility-administered encounter medications on file as of 05/31/2022.    Past Medical History:  Diagnosis Date   Allergy    pollen   Arthritis    Diabetes mellitus without complication ([place]) Resolved 2014 after weight loss   Hyperlipidemia Resolved 2014 after weight loss   Hypertension    Neuromuscular disorder ([place]) 2018   pt states spinal cord injury due to spinal surgery, pt uses walker for mobility as of 02/20/21.   Weight loss     Past Surgical History:  Procedure Laterality Date   LAPAROSCOPIC GASTRIC SLEEVE RESECTION     LUMBAR LAMINECTOMY  12/01/2016   Lumbar decompression L2-5 LUMBAR LAMINECTOMY/DECOMPRESSION MICRODISCECTOMY 3 LEVELS (N/A)   LUMBAR LAMINECTOMY/DECOMPRESSION MICRODISCECTOMY N/A 12/01/2016   Procedure: Lumbar decompression L2-5 LUMBAR LAMINECTOMY/DECOMPRESSION MICRODISCECTOMY 3 LEVELS;   Surgeon: Melina Schools, MD;  Location: [place];  Service: Orthopedics;  Laterality: N/A;   SPINE SURGERY N/A 2018   02/20/21- pt reports spinal cord injury during laminectemy , pt uses walker now    Family History  Problem Relation Age of Onset   Diabetes Mother    Hypertension Mother    Diabetes Father    Hypertension Father    Colon cancer Neg Hx    Colon polyps Neg Hx    Esophageal cancer Neg Hx    Rectal cancer Neg Hx    Stomach cancer Neg Hx     Social History   Socioeconomic History   Marital status: Married    Spouse name: Not on file   Number of children: 2   Years of education: Not on file   Highest education level: Not on file  Occupational History   Occupation: Retired  Tobacco Use   Smoking status: Some Days    Types: Cigars   Smokeless tobacco: Never   Tobacco comments:    from time to time     Maybe twice a month  Vaping Use   Vaping Use: Never used  Substance and Sexual Activity   Alcohol use: Yes    Comment: drinks beer socially at times    Drug use: No   Sexual activity: Never  Other Topics Concern   Not on file  Social History Narrative   Not on file   Social Determinants of Health   Financial Resource Strain: Low Risk  (05/21/2022)   Overall Financial Resource Strain (CARDIA)  Difficulty of Paying Living Expenses: Not very hard  Food Insecurity: No Food Insecurity (05/21/2022)   Hunger Vital Sign    Worried About Running Out of Food in the Last Year: Never true    Ran Out of Food in the Last Year: Never true  Transportation Needs: No Transportation Needs (05/21/2022)   PRAPARE - Hydrologist (Medical): No    Lack of Transportation (Non-Medical): No  Physical Activity: Inactive (05/21/2022)   Exercise Vital Sign    Days of Exercise per Week: 0 days    Minutes of Exercise per Session: 0 min  Stress: No Stress Concern Present (05/21/2022)   [place]     Feeling of Stress : Not at all  Social Connections: Moderately Isolated (05/21/2022)   Social Connection and Isolation Panel [NHANES]    Frequency of Communication with Friends and Family: More than three times a week    Frequency of Social Gatherings with Friends and Family: More than three times a week    Attends Religious Services: Never    Marine scientist or Organizations: No    Attends Archivist Meetings: Never    Marital Status: Married  Human resources officer Violence: Not At Risk (05/21/2022)   Humiliation, Afraid, Rape, and Kick questionnaire    Fear of Current or Ex-Partner: No    Emotionally Abused: No    Physically Abused: No    Sexually Abused: No    Review of Systems  All other systems reviewed and are negative.       Objective    BP (!) 148/91   Pulse 61   Temp 98.1 F (36.7 C) (Oral)   Resp 16   Wt (!) 310 lb (140.6 kg)   SpO2 97%   BMI 34.92 kg/m   Physical Exam Vitals and nursing note reviewed.  Constitutional:      General: He is not in acute distress. Cardiovascular:     Rate and Rhythm: Normal rate and regular rhythm.  Pulmonary:     Effort: Pulmonary effort is normal.     Breath sounds: Normal breath sounds.  Abdominal:     Palpations: Abdomen is soft.     Tenderness: There is no abdominal tenderness.  Musculoskeletal:     Lumbar back: Tenderness present. No deformity. Decreased range of motion.     Comments: Utilizing walker for stability  Neurological:     General: No focal deficit present.     Mental Status: He is alert and oriented to person, place, and time.     Motor: Weakness present.         Assessment & Plan:   1. Essential hypertension Slightly elevated readings. Keep scheduled appt with consultant for further eval/mgt  2. Lumbar radiculopathy Appears stable. Continue present management    No follow-ups on file.   Becky Sax, MD

## 2022-08-11 ENCOUNTER — Other Ambulatory Visit: Payer: Self-pay | Admitting: Family Medicine

## 2022-08-11 DIAGNOSIS — I10 Essential (primary) hypertension: Secondary | ICD-10-CM

## 2022-08-11 NOTE — Telephone Encounter (Signed)
Requested Prescriptions  Pending Prescriptions Disp Refills  . irbesartan (AVAPRO) 300 MG tablet [Pharmacy Med Name: IRBESARTAN 300 MG TABLET] 90 tablet 0    Sig: TAKE 1 TABLET BY MOUTH EVERY DAY     Cardiovascular:  Angiotensin Receptor Blockers Failed - 08/11/2022  2:18 AM      Failed - Last BP in normal range    BP Readings from Last 1 Encounters:  05/31/22 (!) 148/91         Passed - Cr in normal range and within 180 days    Creatinine, Ser  Date Value Ref Range Status  03/02/2022 1.15 0.76 - 1.27 mg/dL Final         Passed - K in normal range and within 180 days    Potassium  Date Value Ref Range Status  03/02/2022 3.7 3.5 - 5.2 mmol/L Final         Passed - Patient is not pregnant      Passed - Valid encounter within last 6 months    Recent Outpatient Visits          2 months ago Lumbar radiculopathy   Primary Care at [facility], MD   5 months ago Essential (primary) hypertension   Primary Care at [facility], [place], NP   1 year ago Encounter to establish care   Primary Care at [facility], Bayard Beaver, MD   5 years ago Pre-operative clearance   [facility] at [facility], Arlie Solomons, MD      Future Appointments            In 2 weeks Dorna Mai, MD Primary Care at [facility]

## 2022-08-30 ENCOUNTER — Ambulatory Visit (INDEPENDENT_AMBULATORY_CARE_PROVIDER_SITE_OTHER): Payer: Medicare HMO | Admitting: Family Medicine

## 2022-08-30 VITALS — BP 152/96 | HR 77 | Temp 98.1°F | Resp 16 | Wt 308.4 lb

## 2022-08-30 DIAGNOSIS — M5416 Radiculopathy, lumbar region: Secondary | ICD-10-CM | POA: Diagnosis not present

## 2022-08-30 DIAGNOSIS — I10 Essential (primary) hypertension: Secondary | ICD-10-CM | POA: Diagnosis not present

## 2022-08-30 MED ORDER — HYDRALAZINE HCL 25 MG PO TABS: 25.0000 mg | ORAL_TABLET | Freq: Two times a day (BID) | ORAL | 1 refills | Status: DC

## 2022-08-31 ENCOUNTER — Encounter: Payer: Self-pay | Admitting: Family Medicine

## 2022-08-31 NOTE — Progress Notes (Signed)
Established Patient Office Visit  Subjective    Patient ID: ANCHOR DWAN, male    DOB: 08/02/1961  Age: 61 y.o. MRN: [id number]  CC:  Chief Complaint  Patient presents with   Follow-up    HPI [name] presents for routine follow up of chronic med issues. Patient denies acute complaints or concerns.    Outpatient Encounter Medications as of 08/30/2022  Medication Sig   amLODipine (NORVASC) 10 MG tablet TAKE 1 TABLET BY MOUTH EVERY DAY   chlorthalidone (HYGROTON) 25 MG tablet Take one tablet by mouth daily.   hydrALAZINE (APRESOLINE) 25 MG tablet Take 1 tablet (25 mg total) by mouth 2 (two) times daily.   irbesartan (AVAPRO) 300 MG tablet TAKE 1 TABLET BY MOUTH EVERY DAY   Multiple Vitamins-Minerals (MULTIVITAMIN WITH MINERALS) tablet Take 1 tablet by mouth daily.   No facility-administered encounter medications on file as of 08/30/2022.    Past Medical History:  Diagnosis Date   Allergy    pollen   Arthritis    Diabetes mellitus without complication ([place]) Resolved 2014 after weight loss   Hyperlipidemia Resolved 2014 after weight loss   Hypertension    Neuromuscular disorder ([place]) 2018   pt states spinal cord injury due to spinal surgery, pt uses walker for mobility as of 02/20/21.   Weight loss     Past Surgical History:  Procedure Laterality Date   LAPAROSCOPIC GASTRIC SLEEVE RESECTION     LUMBAR LAMINECTOMY  12/01/2016   Lumbar decompression L2-5 LUMBAR LAMINECTOMY/DECOMPRESSION MICRODISCECTOMY 3 LEVELS (N/A)   LUMBAR LAMINECTOMY/DECOMPRESSION MICRODISCECTOMY N/A 12/01/2016   Procedure: Lumbar decompression L2-5 LUMBAR LAMINECTOMY/DECOMPRESSION MICRODISCECTOMY 3 LEVELS;  Surgeon: Melina Schools, MD;  Location: [place];  Service: Orthopedics;  Laterality: N/A;   SPINE SURGERY N/A 2018   02/20/21- pt reports spinal cord injury during laminectemy , pt uses walker now    Family History  Problem Relation Age of Onset   Diabetes Mother    Hypertension Mother     Diabetes Father    Hypertension Father    Colon cancer Neg Hx    Colon polyps Neg Hx    Esophageal cancer Neg Hx    Rectal cancer Neg Hx    Stomach cancer Neg Hx     Social History   Socioeconomic History   Marital status: Married    Spouse name: Not on file   Number of children: 2   Years of education: Not on file   Highest education level: Not on file  Occupational History   Occupation: Retired  Tobacco Use   Smoking status: Some Days    Types: Cigars   Smokeless tobacco: Never   Tobacco comments:    from time to time     Maybe twice a month  Vaping Use   Vaping Use: Never used  Substance and Sexual Activity   Alcohol use: Yes    Comment: drinks beer socially at times    Drug use: No   Sexual activity: Never  Other Topics Concern   Not on file  Social History Narrative   Not on file   Social Determinants of Health   Financial Resource Strain: Low Risk  (05/21/2022)   Overall Financial Resource Strain (CARDIA)    Difficulty of Paying Living Expenses: Not very hard  Food Insecurity: No Food Insecurity (05/21/2022)   Hunger Vital Sign    Worried About Running Out of Food in the Last Year: Never true    Ran Out of  Food in the Last Year: Never true  Transportation Needs: No Transportation Needs (05/21/2022)   PRAPARE - Hydrologist (Medical): No    Lack of Transportation (Non-Medical): No  Physical Activity: Inactive (05/21/2022)   Exercise Vital Sign    Days of Exercise per Week: 0 days    Minutes of Exercise per Session: 0 min  Stress: No Stress Concern Present (05/21/2022)   [place]    Feeling of Stress : Not at all  Social Connections: Moderately Isolated (05/21/2022)   Social Connection and Isolation Panel [NHANES]    Frequency of Communication with Friends and Family: More than three times a week    Frequency of Social Gatherings with Friends and Family: More than  three times a week    Attends Religious Services: Never    Marine scientist or Organizations: No    Attends Archivist Meetings: Never    Marital Status: Married  Human resources officer Violence: Not At Risk (05/21/2022)   Humiliation, Afraid, Rape, and Kick questionnaire    Fear of Current or Ex-Partner: No    Emotionally Abused: No    Physically Abused: No    Sexually Abused: No    Review of Systems  All other systems reviewed and are negative.       Objective    BP (!) 152/96   Pulse 77   Temp 98.1 F (36.7 C) (Oral)   Resp 16   Wt (!) 308 lb 6.4 oz (139.9 kg)   SpO2 98%   BMI 34.74 kg/m   Physical Exam Vitals and nursing note reviewed.  Constitutional:      General: He is not in acute distress. Cardiovascular:     Rate and Rhythm: Normal rate and regular rhythm.  Pulmonary:     Effort: Pulmonary effort is normal.     Breath sounds: Normal breath sounds.  Abdominal:     Palpations: Abdomen is soft.     Tenderness: There is no abdominal tenderness.  Musculoskeletal:     Lumbar back: Tenderness present. No deformity. Decreased range of motion.     Comments: Utilizing walker for stability  Neurological:     General: No focal deficit present.     Mental Status: He is alert and oriented to person, place, and time.     Motor: Weakness present.         Assessment & Plan:   1. Essential (primary) hypertension Slightly elevated readings. Continue present management and monitor.  - hydrALAZINE (APRESOLINE) 25 MG tablet; Take 1 tablet (25 mg total) by mouth 2 (two) times daily.  Dispense: 180 tablet; Refill: 1  2. Lumbar radiculopathy Management as per consultant    Return in about 3 months (around 11/30/2022) for follow up.   Becky Sax, MD

## 2022-09-28 ENCOUNTER — Other Ambulatory Visit: Payer: Self-pay | Admitting: Family Medicine

## 2022-09-28 DIAGNOSIS — I10 Essential (primary) hypertension: Secondary | ICD-10-CM

## 2022-10-06 ENCOUNTER — Other Ambulatory Visit: Payer: Self-pay | Admitting: Family Medicine

## 2022-10-06 DIAGNOSIS — I10 Essential (primary) hypertension: Secondary | ICD-10-CM

## 2022-10-06 NOTE — Telephone Encounter (Signed)
Requested Prescriptions  Pending Prescriptions Disp Refills   amLODipine (NORVASC) 10 MG tablet [Pharmacy Med Name: AMLODIPINE BESYLATE 10 MG TAB] 90 tablet 0    Sig: TAKE 1 TABLET BY MOUTH EVERY DAY     Cardiovascular: Calcium Channel Blockers 2 Failed - 10/06/2022  1:36 AM      Failed - Last BP in normal range    BP Readings from Last 1 Encounters:  08/30/22 (!) 152/96         Passed - Last Heart Rate in normal range    Pulse Readings from Last 1 Encounters:  08/30/22 77         Passed - Valid encounter within last 6 months    Recent Outpatient Visits           1 month ago Lumbar radiculopathy   Primary Care at [facility], MD   4 months ago Lumbar radiculopathy   Primary Care at [facility], MD   7 months ago Essential (primary) hypertension   Primary Care at [facility] [place], NP   1 year ago Encounter to establish care   Primary Care at [facility], Bayard Beaver, MD   5 years ago Pre-operative clearance   Primary Care at [facility], Arlie Solomons, MD       Future Appointments             In 1 month Dorna Mai, MD Primary Care at [facility]

## 2022-10-28 ENCOUNTER — Other Ambulatory Visit: Payer: Self-pay | Admitting: Family Medicine

## 2022-10-28 DIAGNOSIS — I10 Essential (primary) hypertension: Secondary | ICD-10-CM

## 2022-10-31 ENCOUNTER — Other Ambulatory Visit: Payer: Self-pay | Admitting: Family Medicine

## 2022-10-31 DIAGNOSIS — I10 Essential (primary) hypertension: Secondary | ICD-10-CM

## 2022-11-30 ENCOUNTER — Ambulatory Visit: Payer: Medicare HMO | Admitting: Family Medicine

## 2023-01-04 ENCOUNTER — Other Ambulatory Visit: Payer: Self-pay | Admitting: Family Medicine

## 2023-01-04 DIAGNOSIS — I10 Essential (primary) hypertension: Secondary | ICD-10-CM

## 2023-01-06 ENCOUNTER — Ambulatory Visit: Payer: Medicare HMO | Admitting: Family Medicine

## 2023-01-28 ENCOUNTER — Encounter: Payer: Self-pay | Admitting: Family Medicine

## 2023-01-28 ENCOUNTER — Ambulatory Visit (INDEPENDENT_AMBULATORY_CARE_PROVIDER_SITE_OTHER): Payer: Medicare HMO | Admitting: Family Medicine

## 2023-01-28 VITALS — BP 132/88 | HR 78 | Temp 98.1°F | Resp 16 | Wt 302.2 lb

## 2023-01-28 DIAGNOSIS — I10 Essential (primary) hypertension: Secondary | ICD-10-CM | POA: Diagnosis not present

## 2023-01-28 DIAGNOSIS — M5416 Radiculopathy, lumbar region: Secondary | ICD-10-CM | POA: Diagnosis not present

## 2023-01-28 MED ORDER — CHLORTHALIDONE 25 MG PO TABS: ORAL_TABLET | ORAL | 1 refills | Status: DC

## 2023-01-28 MED ORDER — HYDRALAZINE HCL 25 MG PO TABS: 25.0000 mg | ORAL_TABLET | Freq: Two times a day (BID) | ORAL | 1 refills | Status: DC

## 2023-01-28 MED ORDER — AMLODIPINE BESYLATE 10 MG PO TABS: 10.0000 mg | ORAL_TABLET | Freq: Every day | ORAL | 1 refills | Status: DC

## 2023-01-28 MED ORDER — IRBESARTAN 300 MG PO TABS: 300.0000 mg | ORAL_TABLET | Freq: Every day | ORAL | 1 refills | Status: DC

## 2023-01-28 NOTE — Progress Notes (Unsigned)
Patient is here for their 3 month follow-up Patient has no concerns today Care gaps have been discussed with patient  

## 2023-01-31 ENCOUNTER — Encounter: Payer: Self-pay | Admitting: Family Medicine

## 2023-01-31 NOTE — Progress Notes (Signed)
Established Patient Office Visit  Subjective    [name], male    DOB: [birth year]  Age: 62 y.o. MRN: [id number]  CC: No chief complaint on file.   HPI [name] presents for routine follow up of hypertension. Patient denies acute complaints or concerns.    Outpatient Encounter Medications as of 01/28/2023  Medication Sig   Multiple Vitamins-Minerals (MULTIVITAMIN WITH MINERALS) tablet Take 1 tablet by mouth daily.   [DISCONTINUED] amLODipine (NORVASC) 10 MG tablet TAKE 1 TABLET BY MOUTH EVERY DAY   [DISCONTINUED] chlorthalidone (HYGROTON) 25 MG tablet TAKE 1 TABLET BY MOUTH EVERY DAY   [DISCONTINUED] hydrALAZINE (APRESOLINE) 25 MG tablet Take 1 tablet (25 mg total) by mouth 2 (two) times daily.   [DISCONTINUED] irbesartan (AVAPRO) 300 MG tablet TAKE 1 TABLET BY MOUTH EVERY DAY   amLODipine (NORVASC) 10 MG tablet Take 1 tablet (10 mg total) by mouth daily.   chlorthalidone (HYGROTON) 25 MG tablet TAKE 1 TABLET BY MOUTH EVERY DAY   hydrALAZINE (APRESOLINE) 25 MG tablet Take 1 tablet (25 mg total) by mouth 2 (two) times daily.   irbesartan (AVAPRO) 300 MG tablet Take 1 tablet (300 mg total) by mouth daily.   No facility-administered encounter medications on file as of 01/28/2023.    Past Medical History:  Diagnosis Date   Allergy    pollen   Arthritis    Diabetes mellitus without complication ([place]) Resolved 2014 after weight loss   Hyperlipidemia Resolved 2014 after weight loss   Hypertension    Neuromuscular disorder ([place]) 2018   pt states spinal cord injury due to spinal surgery, pt uses walker for mobility as of 02/20/21.   Weight loss     Past Surgical History:  Procedure Laterality Date   LAPAROSCOPIC GASTRIC SLEEVE RESECTION     LUMBAR LAMINECTOMY  12/01/2016   Lumbar decompression L2-5 LUMBAR LAMINECTOMY/DECOMPRESSION MICRODISCECTOMY 3 LEVELS (N/A)   LUMBAR LAMINECTOMY/DECOMPRESSION MICRODISCECTOMY N/A 12/01/2016   Procedure: Lumbar decompression L2-5  LUMBAR LAMINECTOMY/DECOMPRESSION MICRODISCECTOMY 3 LEVELS;  Surgeon: Melina Schools, MD;  Location: [place];  Service: Orthopedics;  Laterality: N/A;   SPINE SURGERY N/A 2018   02/20/21- pt reports spinal cord injury during laminectemy , pt uses walker now    Family History  Problem Relation Age of Onset   Diabetes Mother    Hypertension Mother    Diabetes Father    Hypertension Father    Colon cancer Neg Hx    Colon polyps Neg Hx    Esophageal cancer Neg Hx    Rectal cancer Neg Hx    Stomach cancer Neg Hx     Social History   Socioeconomic History   Marital status: Married    Spouse name: Not on file   Number of children: 2   Years of education: Not on file   Highest education level: Not on file  Occupational History   Occupation: Retired  Tobacco Use   Smoking status: Some Days    Types: Cigars   Smokeless tobacco: Never   Tobacco comments:    from time to time     Maybe twice a month  Vaping Use   Vaping Use: Never used  Substance and Sexual Activity   Alcohol use: Yes    Comment: drinks beer socially at times    Drug use: No   Sexual activity: Never  Other Topics Concern   Not on file  Social History Narrative   Not on file   Social Determinants  of Health   Financial Resource Strain: Low Risk  (05/21/2022)   Overall Financial Resource Strain (CARDIA)    Difficulty of Paying Living Expenses: Not very hard  Food Insecurity: No Food Insecurity (05/21/2022)   Hunger Vital Sign    Worried About Running Out of Food in the Last Year: Never true    Ran Out of Food in the Last Year: Never true  Transportation Needs: No Transportation Needs (05/21/2022)   PRAPARE - Hydrologist (Medical): No    Lack of Transportation (Non-Medical): No  Physical Activity: Inactive (05/21/2022)   Exercise Vital Sign    Days of Exercise per Week: 0 days    Minutes of Exercise per Session: 0 min  Stress: No Stress Concern Present (05/21/2022)   [place]    Feeling of Stress : Not at all  Social Connections: Moderately Isolated (05/21/2022)   Social Connection and Isolation Panel [NHANES]    Frequency of Communication with Friends and Family: More than three times a week    Frequency of Social Gatherings with Friends and Family: More than three times a week    Attends Religious Services: Never    Marine scientist or Organizations: No    Attends Archivist Meetings: Never    Marital Status: Married  Human resources officer Violence: Not At Risk (05/21/2022)   Humiliation, Afraid, Rape, and Kick questionnaire    Fear of Current or Ex-Partner: No    Emotionally Abused: No    Physically Abused: No    Sexually Abused: No    Review of Systems  All other systems reviewed and are negative.       Objective    BP 132/88   Pulse 78   Temp 98.1 F (36.7 C) (Oral)   Resp 16   Wt (!) 302 lb 3.2 oz (137.1 kg)   SpO2 96%   BMI 34.04 kg/m   Physical Exam Vitals and nursing note reviewed.  Constitutional:      General: He is not in acute distress. Cardiovascular:     Rate and Rhythm: Normal rate and regular rhythm.  Pulmonary:     Effort: Pulmonary effort is normal.     Breath sounds: Normal breath sounds.  Abdominal:     Palpations: Abdomen is soft.     Tenderness: There is no abdominal tenderness.  Musculoskeletal:     Lumbar back: Tenderness present. No deformity. Decreased range of motion.     Comments: Utilizing walker for stability  Neurological:     General: No focal deficit present.     Mental Status: He is alert and oriented to person, place, and time.     Motor: Weakness present.         Assessment & Plan:   1. Essential hypertension Appears stable. Meds refilled. Continue    3. Lumbar radiculopathy Management as per consultant.     Return in about 6 months (around 07/31/2023) for follow up.   Becky Sax, MD

## 2023-03-22 ENCOUNTER — Telehealth: Payer: Self-pay | Admitting: Family Medicine

## 2023-03-22 NOTE — Telephone Encounter (Signed)
Contacted Geo F Shimer to schedule their annual wellness visit. Appointment made for 03/23/23.  Rudell Cobb AWV direct phone # [phone]

## 2023-03-23 ENCOUNTER — Telehealth: Payer: Self-pay

## 2023-03-23 NOTE — Telephone Encounter (Signed)
This nurse attempted to call patient three times for AWV. Message left that we will call to reschedule for another time. 

## 2023-05-05 ENCOUNTER — Ambulatory Visit (INDEPENDENT_AMBULATORY_CARE_PROVIDER_SITE_OTHER): Payer: Medicare HMO

## 2023-05-05 VITALS — Ht 79.0 in | Wt 302.0 lb

## 2023-05-05 DIAGNOSIS — Z0000 Encounter for general adult medical examination without abnormal findings: Secondary | ICD-10-CM | POA: Diagnosis not present

## 2023-05-05 NOTE — Telephone Encounter (Signed)
Pt is calling back stated he is frustrated because the calls are going straight to voicemail. Pt is asking if a direct number can be given; this way, he can get through.  Please advise.

## 2023-05-05 NOTE — Progress Notes (Signed)
I connected with  [name] on 05/05/23 by a audio enabled telemedicine application and verified that I am speaking with the correct person using two identifiers.  Patient Location: Home  Provider Location: Office/Clinic  I discussed the limitations of evaluation and management by telemedicine. The patient expressed understanding and agreed to proceed. Subjective:   [name] is a 62 y.o. male who presents for Medicare Annual/Subsequent preventive examination.  Patient Medicare AWV questionnaire was completed by the patient on 05/01/2023; I have confirmed that all information answered by patient is correct and no changes since this date.    Review of Systems     Cardiac Risk Factors include: advanced age (>50men, >82 women);hypertension;male gender;obesity (BMI >30kg/m2);smoking/ tobacco exposure     Objective:    Today's Vitals   05/05/23 1550  Weight: (!) 302 lb (137 kg)  Height: 6\' 7"  (2.007 m)   Body mass index is 34.02 kg/m.     12/31/2020    3:16 PM 01/05/2017    9:40 AM 12/04/2016    6:52 PM 12/01/2016    4:41 PM 11/25/2016   10:27 AM 07/15/2014   11:38 PM  Advanced Directives  Does Patient Have a Medical Advance Directive? No No Yes No No No  Would patient like information on creating a medical advance directive? No - Patient declined  No - Patient declined No - Patient declined Yes (MAU/Ambulatory/Procedural Areas - Information given) No - patient declined information    Current Medications (verified) Outpatient Encounter Medications as of 05/05/2023  Medication Sig   amLODipine (NORVASC) 10 MG tablet Take 1 tablet (10 mg total) by mouth daily.   chlorthalidone (HYGROTON) 25 MG tablet TAKE 1 TABLET BY MOUTH EVERY DAY   hydrALAZINE (APRESOLINE) 25 MG tablet Take 1 tablet (25 mg total) by mouth 2 (two) times daily.   irbesartan (AVAPRO) 300 MG tablet Take 1 tablet (300 mg total) by mouth daily.   Multiple Vitamins-Minerals (MULTIVITAMIN WITH MINERALS) tablet Take 1  tablet by mouth daily.   No facility-administered encounter medications on file as of 05/05/2023.    Allergies (verified) Patient has no known allergies.   History: Past Medical History:  Diagnosis Date   Allergy    pollen   Arthritis    Diabetes mellitus without complication ([facility]) Resolved 2014 after weight loss   Hyperlipidemia Resolved 2014 after weight loss   Hypertension    Neuromuscular disorder ([facility]) 2018   pt states spinal cord injury due to spinal surgery, pt uses walker for mobility as of 02/20/21.   Weight loss    Past Surgical History:  Procedure Laterality Date   LAPAROSCOPIC GASTRIC SLEEVE RESECTION     LUMBAR LAMINECTOMY  12/01/2016   Lumbar decompression L2-5 LUMBAR LAMINECTOMY/DECOMPRESSION MICRODISCECTOMY 3 LEVELS (N/A)   LUMBAR LAMINECTOMY/DECOMPRESSION MICRODISCECTOMY N/A 12/01/2016   Procedure: Lumbar decompression L2-5 LUMBAR LAMINECTOMY/DECOMPRESSION MICRODISCECTOMY 3 LEVELS;  Surgeon: Venita Lick, MD;  Location: [facility];  Service: Orthopedics;  Laterality: N/A;   SPINE SURGERY N/A 2018   02/20/21- pt reports spinal cord injury during laminectemy , pt uses walker now   Family History  Problem Relation Age of Onset   Diabetes Mother    Hypertension Mother    Diabetes Father    Hypertension Father    Colon cancer Neg Hx    Colon polyps Neg Hx    Esophageal cancer Neg Hx    Rectal cancer Neg Hx    Stomach cancer Neg Hx    Social History  Socioeconomic History   Marital status: Married    Spouse name: Not on file   Number of children: 2   Years of education: Not on file   Highest education level: Not on file  Occupational History   Occupation: Retired  Tobacco Use   Smoking status: Light Smoker    Types: Cigars   Smokeless tobacco: Never   Tobacco comments:    from time to time     Maybe twice a month  Vaping Use   Vaping Use: Never used  Substance and Sexual Activity   Alcohol use: Yes    Alcohol/week: 4.0 standard drinks of alcohol     Types: 2 Cans of beer, 2 Shots of liquor per week    Comment: drinks beer socially at times    Drug use: No   Sexual activity: Yes  Other Topics Concern   Not on file  Social History Narrative   Not on file   Social Determinants of Health   Financial Resource Strain: Low Risk  (05/01/2023)   Overall Financial Resource Strain (CARDIA)    Difficulty of Paying Living Expenses: Not hard at all  Food Insecurity: No Food Insecurity (05/01/2023)   Hunger Vital Sign    Worried About Running Out of Food in the Last Year: Never true    Ran Out of Food in the Last Year: Never true  Transportation Needs: No Transportation Needs (05/01/2023)   PRAPARE - Administrator, Civil Service (Medical): No    Lack of Transportation (Non-Medical): No  Physical Activity: Sufficiently Active (05/01/2023)   Exercise Vital Sign    Days of Exercise per Week: 5 days    Minutes of Exercise per Session: 60 min  Stress: No Stress Concern Present (05/01/2023)   [employer] of Occupational Health - Occupational Stress Questionnaire    Feeling of Stress : Not at all  Social Connections: Moderately Integrated (05/01/2023)   Social Connection and Isolation Panel [NHANES]    Frequency of Communication with Friends and Family: More than three times a week    Frequency of Social Gatherings with Friends and Family: More than three times a week    Attends Religious Services: Never    Database administrator or Organizations: Yes    Attends Engineer, structural: More than 4 times per year    Marital Status: Married    Tobacco Counseling Ready to quit: Not Answered Counseling given: Not Answered Tobacco comments: from time to time  Maybe twice a month   Clinical Intake:  Pre-visit preparation completed: Yes  Pain : No/denies pain     Nutritional Status: BMI > 30  Obese Nutritional Risks: None Diabetes: No  How often do you need to have someone help you when you read instructions, pamphlets,  or other written materials from your doctor or pharmacy?: 1 - Never  Diabetic? no  Interpreter Needed?: No  Information entered by :: NAllen LPN   Activities of Daily Living    05/01/2023   10:32 AM 03/22/2023    1:36 PM  In your present state of health, do you have any difficulty performing the following activities:  Hearing? 0 0  Vision? 0 1  Difficulty concentrating or making decisions? 0 0  Walking or climbing stairs? 1 1  Comment has walker   Dressing or bathing? 0 0  Doing errands, shopping? 0 0  Preparing Food and eating ? N N  Using the Toilet? N N  In the past  six months, have you accidently leaked urine? N N  Do you have problems with loss of bowel control? N N  Managing your Medications? N N  Managing your Finances? N N  Housekeeping or managing your Housekeeping? N N    Patient Care Team: Georganna Skeans, MD as PCP - General (Family Medicine)  Indicate any recent Medical Services you may have received from other than [facility] providers in the past year (date may be approximate).     Assessment:   This is a routine wellness examination for Toryn.  Hearing/Vision screen Vision Screening - Comments:: Regular eye exams, [facility]  Dietary issues and exercise activities discussed: Current Exercise Habits: Home exercise routine, Type of exercise: calisthenics;strength training/weights, Frequency (Times/Week): 5   Goals Addressed             This Visit's Progress    Patient Stated       05/05/2023, staying alive       Depression Screen    05/05/2023    3:55 PM 01/28/2023    9:44 AM 05/21/2022   12:59 PM 03/02/2022    1:39 PM 12/31/2020    3:13 PM 01/05/2017    9:41 AM 11/03/2016    4:17 PM  PHQ 2/9 Scores  PHQ - 2 Score 0 0 0 0 0 0 0  PHQ- 9 Score  0   0 0     Fall Risk    05/01/2023   10:32 AM 03/22/2023    1:36 PM 05/17/2022   12:17 PM 12/31/2020    3:13 PM 01/05/2017    9:41 AM  Fall Risk   Falls in the past year? 0 0 1 0 No  Number falls in past  yr: 0 0 0 0   Injury with Fall? 0 0 0 0   Risk for fall due to : Medication side effect      Follow up Falls prevention discussed;Education provided;Falls evaluation completed        FALL RISK PREVENTION PERTAINING TO THE HOME:  Any stairs in or around the home? Yes  If so, are there any without handrails? No  Home free of loose throw rugs in walkways, pet beds, electrical cords, etc? Yes  Adequate lighting in your home to reduce risk of falls? Yes   ASSISTIVE DEVICES UTILIZED TO PREVENT FALLS:  Life alert? No  Use of a cane, walker or w/c? Yes  Grab bars in the bathroom? Yes  Shower chair or bench in shower? Yes  Elevated toilet seat or a handicapped toilet? Yes   TIMED UP AND GO:  Was the test performed? No .      Cognitive Function:        05/05/2023    3:55 PM  6CIT Screen  What Year? 0 points  What month? 0 points  What time? 0 points  Count back from 20 0 points  Months in reverse 4 points  Repeat phrase 2 points  Total Score 6 points    Immunizations Immunization History  Administered Date(s) Administered   Moderna Sars-Covid-2 Vaccination 01/28/2020, 02/25/2020, 10/22/2020   Tdap 11/23/2015    TDAP status: Up to date  Flu Vaccine status: Declined, Education has been provided regarding the importance of this vaccine but patient still declined. Advised may receive this vaccine at local pharmacy or Health Dept. Aware to provide a copy of the vaccination record if obtained from local pharmacy or Health Dept. Verbalized acceptance and understanding.  Pneumococcal vaccine status: Declined,  Education has been provided regarding the importance of this vaccine but patient still declined. Advised may receive this vaccine at local pharmacy or Health Dept. Aware to provide a copy of the vaccination record if obtained from local pharmacy or Health Dept. Verbalized acceptance and understanding.   Covid-19 vaccine status: Completed vaccines  Qualifies for Shingles  Vaccine? Yes   Zostavax completed No   Shingrix Completed?: No.    Education has been provided regarding the importance of this vaccine. Patient has been advised to call insurance company to determine out of pocket expense if they have not yet received this vaccine. Advised may also receive vaccine at local pharmacy or Health Dept. Verbalized acceptance and understanding.  Screening Tests Health Maintenance  Topic Date Due   HIV Screening  Never done   Hepatitis C Screening  Never done   Colonoscopy  03/13/2022   COVID-19 Vaccine (4 - 2023-24 season) 07/23/2022   Medicare Annual Wellness (AWV)  05/04/2024   DTaP/Tdap/Td (2 - Td or Tdap) 11/22/2025   HPV VACCINES  Aged Out   INFLUENZA VACCINE  Discontinued   Zoster Vaccines- Shingrix  Discontinued    Health Maintenance  Health Maintenance Due  Topic Date Due   HIV Screening  Never done   Hepatitis C Screening  Never done   Colonoscopy  03/13/2022   COVID-19 Vaccine (4 - 2023-24 season) 07/23/2022    Colorectal cancer screening: Type of screening: Colonoscopy. Completed 03/13/2021. Repeat every 1 years  Lung Cancer Screening: (Low Dose CT Chest recommended if Age 11-80 years, 30 pack-year currently smoking OR have quit w/in 15years.) does not qualify.   Lung Cancer Screening Referral: no  Additional Screening:  Hepatitis C Screening: does qualify;   Vision Screening: Recommended annual ophthalmology exams for early detection of glaucoma and other disorders of the eye. Is the patient up to date with their annual eye exam?  Yes  Who is the provider or what is the name of the office in which the patient attends annual eye exams? [facility] If pt is not established with a provider, would they like to be referred to a provider to establish care? No .   Dental Screening: Recommended annual dental exams for proper oral hygiene  Community Resource Referral / Chronic Care Management: CRR required this visit?  No   CCM required  this visit?  No      Plan:     I have personally reviewed and noted the following in the patient's chart:   Medical and social history Use of alcohol, tobacco or illicit drugs  Current medications and supplements including opioid prescriptions. Patient is not currently taking opioid prescriptions. Functional ability and status Nutritional status Physical activity Advanced directives List of other physicians Hospitalizations, surgeries, and ER visits in previous 12 months Vitals Screenings to include cognitive, depression, and falls Referrals and appointments  In addition, I have reviewed and discussed with patient certain preventive protocols, quality metrics, and best practice recommendations. A written personalized care plan for preventive services as well as general preventive health recommendations were provided to patient.     Barb Merino, LPN   1/61/[id number]   Nurse Notes: none  Due to this being a virtual visit, the after visit summary with patients personalized plan was offered to patient via mail or my-chart.  Patient would like to access on my-chart

## 2023-05-05 NOTE — Patient Instructions (Signed)
[name] , Thank you for taking time to come for your Medicare Wellness Visit. I appreciate your ongoing commitment to your health goals. Please review the following plan we discussed and let me know if I can assist you in the future.   These are the goals we discussed:  Goals      Blood Pressure < 130/80     Patient Stated     05/05/2023, staying alive        This is a list of the screening recommended for you and due dates:  Health Maintenance  Topic Date Due   HIV Screening  Never done   Hepatitis C Screening  Never done   Colon Cancer Screening  03/13/2022   COVID-19 Vaccine (4 - 2023-24 season) 07/23/2022   Medicare Annual Wellness Visit  05/04/2024   DTaP/Tdap/Td vaccine (2 - Td or Tdap) 11/22/2025   HPV Vaccine  Aged Out   Flu Shot  Discontinued   Zoster (Shingles) Vaccine  Discontinued    Advanced directives: Advance directive discussed with you today.   Conditions/risks identified: smokes cigars sometimes  Next appointment: Follow up in one year for your annual wellness visit   Preventive Care 40-64 Years, Male Preventive care refers to lifestyle choices and visits with your health care provider that can promote health and wellness. What does preventive care include? A yearly physical exam. This is also called an annual well check. Dental exams once or twice a year. Routine eye exams. Ask your health care provider how often you should have your eyes checked. Personal lifestyle choices, including: Daily care of your teeth and gums. Regular physical activity. Eating a healthy diet. Avoiding tobacco and drug use. Limiting alcohol use. Practicing safe sex. Taking low-dose aspirin every day starting at age 43. What happens during an annual well check? The services and screenings done by your health care provider during your annual well check will depend on your age, overall health, lifestyle risk factors, and family history of disease. Counseling  Your health  care provider may ask you questions about your: Alcohol use. Tobacco use. Drug use. Emotional well-being. Home and relationship well-being. Sexual activity. Eating habits. Work and work Astronomer. Screening  You may have the following tests or measurements: Height, weight, and BMI. Blood pressure. Lipid and cholesterol levels. These may be checked every 5 years, or more frequently if you are over 18 years old. Skin check. Lung cancer screening. You may have this screening every year starting at age 50 if you have a 30-pack-year history of smoking and currently smoke or have quit within the past 15 years. Fecal occult blood test (FOBT) of the stool. You may have this test every year starting at age 79. Flexible sigmoidoscopy or colonoscopy. You may have a sigmoidoscopy every 5 years or a colonoscopy every 10 years starting at age 32. Prostate cancer screening. Recommendations will vary depending on your family history and other risks. Hepatitis C blood test. Hepatitis B blood test. Sexually transmitted disease (STD) testing. Diabetes screening. This is done by checking your blood sugar (glucose) after you have not eaten for a while (fasting). You may have this done every 1-3 years. Discuss your test results, treatment options, and if necessary, the need for more tests with your health care provider. Vaccines  Your health care provider may recommend certain vaccines, such as: Influenza vaccine. This is recommended every year. Tetanus, diphtheria, and acellular pertussis (Tdap, Td) vaccine. You may need a Td booster every 10 years. Zoster  vaccine. You may need this after age 27. Pneumococcal 13-valent conjugate (PCV13) vaccine. You may need this if you have certain conditions and have not been vaccinated. Pneumococcal polysaccharide (PPSV23) vaccine. You may need one or two doses if you smoke cigarettes or if you have certain conditions. Talk to your health care provider about which  screenings and vaccines you need and how often you need them. This information is not intended to replace advice given to you by your health care provider. Make sure you discuss any questions you have with your health care provider. Document Released: 12/05/2015 Document Revised: 07/28/2016 Document Reviewed: 09/09/2015 Elsevier Interactive Patient Education  2017 [employer].  Fall Prevention in the Home Falls can cause injuries. They can happen to people of all ages. There are many things you can do to make your home safe and to help prevent falls. What can I do on the outside of my home? Regularly fix the edges of walkways and driveways and fix any cracks. Remove anything that might make you trip as you walk through a door, such as a raised step or threshold. Trim any bushes or trees on the path to your home. Use bright outdoor lighting. Clear any walking paths of anything that might make someone trip, such as rocks or tools. Regularly check to see if handrails are loose or broken. Make sure that both sides of any steps have handrails. Any raised decks and porches should have guardrails on the edges. Have any leaves, snow, or ice cleared regularly. Use sand or salt on walking paths during winter. Clean up any spills in your garage right away. This includes oil or grease spills. What can I do in the bathroom? Use night lights. Install grab bars by the toilet and in the tub and shower. Do not use towel bars as grab bars. Use non-skid mats or decals in the tub or shower. If you need to sit down in the shower, use a plastic, non-slip stool. Keep the floor dry. Clean up any water that spills on the floor as soon as it happens. Remove soap buildup in the tub or shower regularly. Attach bath mats securely with double-sided non-slip rug tape. Do not have throw rugs and other things on the floor that can make you trip. What can I do in the bedroom? Use night lights. Make sure that you have a  light by your bed that is easy to reach. Do not use any sheets or blankets that are too big for your bed. They should not hang down onto the floor. Have a firm chair that has side arms. You can use this for support while you get dressed. Do not have throw rugs and other things on the floor that can make you trip. What can I do in the kitchen? Clean up any spills right away. Avoid walking on wet floors. Keep items that you use a lot in easy-to-reach places. If you need to reach something above you, use a strong step stool that has a grab bar. Keep electrical cords out of the way. Do not use floor polish or wax that makes floors slippery. If you must use wax, use non-skid floor wax. Do not have throw rugs and other things on the floor that can make you trip. What can I do with my stairs? Do not leave any items on the stairs. Make sure that there are handrails on both sides of the stairs and use them. Fix handrails that are broken or loose. Make  sure that handrails are as long as the stairways. Check any carpeting to make sure that it is firmly attached to the stairs. Fix any carpet that is loose or worn. Avoid having throw rugs at the top or bottom of the stairs. If you do have throw rugs, attach them to the floor with carpet tape. Make sure that you have a light switch at the top of the stairs and the bottom of the stairs. If you do not have them, ask someone to add them for you. What else can I do to help prevent falls? Wear shoes that: Do not have high heels. Have rubber bottoms. Are comfortable and fit you well. Are closed at the toe. Do not wear sandals. If you use a stepladder: Make sure that it is fully opened. Do not climb a closed stepladder. Make sure that both sides of the stepladder are locked into place. Ask someone to hold it for you, if possible. Clearly mark and make sure that you can see: Any grab bars or handrails. First and last steps. Where the edge of each step  is. Use tools that help you move around (mobility aids) if they are needed. These include: Canes. Walkers. Scooters. Crutches. Turn on the lights when you go into a dark area. Replace any light bulbs as soon as they burn out. Set up your furniture so you have a clear path. Avoid moving your furniture around. If any of your floors are uneven, fix them. If there are any pets around you, be aware of where they are. Review your medicines with your doctor. Some medicines can make you feel dizzy. This can increase your chance of falling. Ask your doctor what other things that you can do to help prevent falls. This information is not intended to replace advice given to you by your health care provider. Make sure you discuss any questions you have with your health care provider. Document Released: 09/04/2009 Document Revised: 04/15/2016 Document Reviewed: 12/13/2014 Elsevier Interactive Patient Education  2017 [employer].

## 2023-05-05 NOTE — Telephone Encounter (Addendum)
Pt called in frustrated, not sure what's going on, but he says he has been trying to keep his appt,but he says when he gets a call from [place], it doesn't ring or anything, while he is watching the phone, and he has it turned all the say up, but can't get anyone on the line when he tries to pick up.

## 2023-05-19 ENCOUNTER — Ambulatory Visit: Payer: Self-pay

## 2023-05-19 NOTE — Telephone Encounter (Signed)
Message from Randol Kern sent at 05/19/2023  9:03 AM EDT  Summary: Dizzy, low blood pressure   Pt says his blood pressure is low, says he starting sweating and was dizzy. Says his reading yesterday was 106/66.  Believes this is med related.  Best contact: [phone]         Chief Complaint: low BP  Symptoms: dizziness, sweating yesterday. No sx today. BP yesterday 105/65  rechecked during call 107/66.  Frequency: yesterday  Pertinent Negatives: Patient denies any chest pain, SOB, any sx at all Disposition: [] ED /[] Urgent Care (no appt availability in office) / [x] Appointment(In office/virtual)/ []  [facility]/ [] Home Care/ [] Refused Recommended Disposition /[] [facility]/ []  Follow-up with PCP Additional Notes: no appt's with PCP available[ scheduled with Theotis Barrio NP tomorrow am .   Reason for Disposition  [1] Fall in systolic BP > 20 mm Hg from normal AND [2] NOT dizzy, lightheaded, or weak  Answer Assessment - Initial Assessment Questions 1. BLOOD PRESSURE: "What is the blood pressure?" "Did you take at least two measurements 5 minutes apart?"     105/65  107/66 2. ONSET: "When did you take your blood pressure?"     During call  3. HOW: "How did you obtain the blood pressure?" (e.g., visiting nurse, automatic home BP monitor)     Watch  4. HISTORY: "Do you have a history of low blood pressure?" "What is your blood pressure normally?"     No  5. MEDICINES: "Are you taking any medications for blood pressure?" If Yes, ask: "Have they been changed recently?"     yes 6. PULSE RATE: "Do you know what your pulse rate is?"      *No Answer*  Protocols used: Blood Pressure - Low-A-AH

## 2023-05-20 ENCOUNTER — Encounter: Payer: Self-pay | Admitting: Family

## 2023-05-20 ENCOUNTER — Ambulatory Visit (INDEPENDENT_AMBULATORY_CARE_PROVIDER_SITE_OTHER): Payer: Medicare HMO | Admitting: Family

## 2023-05-20 VITALS — BP 147/91 | HR 73 | Temp 98.9°F | Ht 79.0 in | Wt 247.6 lb

## 2023-05-20 DIAGNOSIS — I10 Essential (primary) hypertension: Secondary | ICD-10-CM | POA: Diagnosis not present

## 2023-05-20 NOTE — Progress Notes (Signed)
[name], male    DOB: Apr 27, 1961  MRN: [id number]  CC: Blood Pressure Check  Subjective: [name] is a 62 y.o. male who presents for blood pressure check.  His concerns today include:  05/19/2023 per triage RN call note: Summary: Dizzy, low blood pressure     Pt says his blood pressure is low, says he starting sweating and was dizzy. Says his reading yesterday was 106/66.  Believes this is med related.  Best contact: [phone]          Chief Complaint: low BP  Symptoms: dizziness, sweating yesterday. No sx today. BP yesterday 105/65  rechecked during call 107/66.  Frequency: yesterday  Pertinent Negatives: Patient denies any chest pain, SOB, any sx at all Disposition: [] ED /[] Urgent Care (no appt availability in office) / [x] Appointment(In office/virtual)/ []  [facility]/ [] Home Care/ [] Refused Recommended Disposition /[] [facility]/ []  Follow-up with PCP Additional Notes: no appt's with PCP available[ scheduled with Theotis Barrio NP tomorrow am .   Reason for Disposition  [1] Fall in systolic BP > 20 mm Hg from normal AND [2] NOT dizzy, lightheaded, or weak  Answer Assessment - Initial Assessment Questions 1. BLOOD PRESSURE: "What is the blood pressure?" "Did you take at least two measurements 5 minutes apart?"     105/65  107/66 2. ONSET: "When did you take your blood pressure?"     During call  3. HOW: "How did you obtain the blood pressure?" (e.g., visiting nurse, automatic home BP monitor)     Watch  4. HISTORY: "Do you have a history of low blood pressure?" "What is your blood pressure normally?"     No  5. MEDICINES: "Are you taking any medications for blood pressure?" If Yes, ask: "Have they been changed recently?"     yes 6. PULSE RATE: "Do you know what your pulse rate is?"      *No Answer*  Protocols used: Blood Pressure - Low-A-AH  Today's visit 05/20/2023: Doing well on Amlodipine, Chlorthalidone, Hydralazine,  Irbesartan, no issues/concerns. Reports low blood pressure with dizziness was only on the one occasion. States EMS arrived to his home on that day and blood pressure was low. Reports since then not feeling dizzy. He does not complain of red flag symptoms such as but not limited to chest pain, shortness of breath, worst headache of life, nausea/vomiting. No further issues/concerns for discussion today.   Patient Active Problem List   Diagnosis Date Noted   Deep vein thrombosis (DVT) of distal vein of left lower extremity (HCC) 07/11/2018   Low back pain 01/03/2018   Pain and swelling of lower extremity 03/22/2017   Acute deep vein thrombosis (DVT) of femoral vein of left lower extremity (HCC) 03/22/2017   Prerenal azotemia 12/09/2016   Lumbar radiculopathy 12/04/2016   Weakness of both legs    Prediabetes    Bradycardia 07/17/2014   Essential hypertension 07/17/2014   Syncope 07/15/2014     Current Outpatient Medications on File Prior to Visit  Medication Sig Dispense Refill   amLODipine (NORVASC) 10 MG tablet Take 1 tablet (10 mg total) by mouth daily. 90 tablet 1   chlorthalidone (HYGROTON) 25 MG tablet TAKE 1 TABLET BY MOUTH EVERY DAY 90 tablet 1   hydrALAZINE (APRESOLINE) 25 MG tablet Take 1 tablet (25 mg total) by mouth 2 (two) times daily. 180 tablet 1   irbesartan (AVAPRO) 300 MG tablet Take 1 tablet (300 mg total) by mouth daily.  90 tablet 1   Multiple Vitamins-Minerals (MULTIVITAMIN WITH MINERALS) tablet Take 1 tablet by mouth daily.     No current facility-administered medications on file prior to visit.    No Known Allergies  Social History   Socioeconomic History   Marital status: Married    Spouse name: Not on file   Number of children: 2   Years of education: Not on file   Highest education level: Some college, no degree  Occupational History   Occupation: Retired  Tobacco Use   Smoking status: Light Smoker    Types: Cigars   Smokeless tobacco: Never    Tobacco comments:    from time to time     Maybe twice a month  Vaping Use   Vaping Use: Never used  Substance and Sexual Activity   Alcohol use: Yes    Alcohol/week: 4.0 standard drinks of alcohol    Types: 2 Cans of beer, 2 Shots of liquor per week    Comment: drinks beer socially at times    Drug use: No   Sexual activity: Yes  Other Topics Concern   Not on file  Social History Narrative   Not on file   Social Determinants of Health   Financial Resource Strain: Low Risk  (05/19/2023)   Overall Financial Resource Strain (CARDIA)    Difficulty of Paying Living Expenses: Not hard at all  Food Insecurity: No Food Insecurity (05/19/2023)   Hunger Vital Sign    Worried About Running Out of Food in the Last Year: Never true    Ran Out of Food in the Last Year: Never true  Transportation Needs: No Transportation Needs (05/19/2023)   PRAPARE - Administrator, Civil Service (Medical): No    Lack of Transportation (Non-Medical): No  Physical Activity: Insufficiently Active (05/19/2023)   Exercise Vital Sign    Days of Exercise per Week: 5 days    Minutes of Exercise per Session: 20 min  Stress: No Stress Concern Present (05/19/2023)   [employer] of Occupational Health - Occupational Stress Questionnaire    Feeling of Stress : Not at all  Social Connections: Socially Integrated (05/19/2023)   Social Connection and Isolation Panel [NHANES]    Frequency of Communication with Friends and Family: More than three times a week    Frequency of Social Gatherings with Friends and Family: More than three times a week    Attends Religious Services: More than 4 times per year    Active Member of [employer] or Organizations: Yes    Attends Engineer, structural: More than 4 times per year    Marital Status: Married  Catering manager Violence: Not At Risk (05/05/2023)   Humiliation, Afraid, Rape, and Kick questionnaire    Fear of Current or Ex-Partner: No    Emotionally Abused:  No    Physically Abused: No    Sexually Abused: No    Family History  Problem Relation Age of Onset   Diabetes Mother    Hypertension Mother    Diabetes Father    Hypertension Father    Colon cancer Neg Hx    Colon polyps Neg Hx    Esophageal cancer Neg Hx    Rectal cancer Neg Hx    Stomach cancer Neg Hx     Past Surgical History:  Procedure Laterality Date   LAPAROSCOPIC GASTRIC SLEEVE RESECTION     LUMBAR LAMINECTOMY  12/01/2016   Lumbar decompression L2-5 LUMBAR LAMINECTOMY/DECOMPRESSION MICRODISCECTOMY 3 LEVELS (N/A)  LUMBAR LAMINECTOMY/DECOMPRESSION MICRODISCECTOMY N/A 12/01/2016   Procedure: Lumbar decompression L2-5 LUMBAR LAMINECTOMY/DECOMPRESSION MICRODISCECTOMY 3 LEVELS;  Surgeon: Venita Lick, MD;  Location: [facility] OR;  Service: Orthopedics;  Laterality: N/A;   SPINE SURGERY N/A 2018   02/20/21- pt reports spinal cord injury during laminectemy , pt uses walker now    ROS: Review of Systems Negative except as stated above  PHYSICAL EXAM: BP (!) 147/91   Pulse 73   Temp 98.9 F (37.2 C) (Oral)   Ht 6\' 7"  (2.007 m)   Wt 247 lb 9.6 oz (112.3 kg)   SpO2 95%   BMI 27.89 kg/m   Physical Exam HENT:     Head: Normocephalic and atraumatic.     Nose: Nose normal.     Mouth/Throat:     Mouth: Mucous membranes are moist.     Pharynx: Oropharynx is clear.  Eyes:     Extraocular Movements: Extraocular movements intact.     Conjunctiva/sclera: Conjunctivae normal.     Pupils: Pupils are equal, round, and reactive to light.  Cardiovascular:     Rate and Rhythm: Normal rate and regular rhythm.     Pulses: Normal pulses.     Heart sounds: Normal heart sounds.  Pulmonary:     Effort: Pulmonary effort is normal.     Breath sounds: Normal breath sounds.  Musculoskeletal:     Cervical back: Normal range of motion and neck supple.  Neurological:     General: No focal deficit present.     Mental Status: He is alert and oriented to person, place, and time.   Psychiatric:        Mood and Affect: Mood normal.        Behavior: Behavior normal.      ASSESSMENT AND PLAN: 1. Primary hypertension - Continue Amlodipine, Chlorthalidone, Hydralazine, and Irbesartan as prescribed. No refills needed as of present.  - Counseled on blood pressure goal of less than 140/90, low-sodium, DASH diet, medication compliance, 150 minutes of moderate intensity exercise per week as tolerated. Discussed medication compliance, adverse effects. - Follow-up with primary provider in 1 week or sooner if needed.   Patient was given the opportunity to ask questions.  Patient verbalized understanding of the plan and was able to repeat key elements of the plan. Patient was given clear instructions to go to Emergency Department or return to medical center if symptoms don't improve, worsen, or new problems develop.The patient verbalized understanding.   Return in about 1 week (around 05/27/2023) for Follow-Up or next available with Georganna Skeans, MD.  Alex Fendt, NP

## 2023-05-31 ENCOUNTER — Encounter: Payer: Self-pay | Admitting: Family Medicine

## 2023-05-31 ENCOUNTER — Ambulatory Visit (INDEPENDENT_AMBULATORY_CARE_PROVIDER_SITE_OTHER): Payer: Medicare HMO | Admitting: Family Medicine

## 2023-05-31 VITALS — BP 114/78 | HR 91 | Temp 97.7°F | Resp 16 | Wt 247.0 lb

## 2023-05-31 DIAGNOSIS — I10 Essential (primary) hypertension: Secondary | ICD-10-CM | POA: Diagnosis not present

## 2023-06-02 ENCOUNTER — Encounter: Payer: Self-pay | Admitting: Family Medicine

## 2023-06-02 NOTE — Progress Notes (Signed)
Established Patient Office Visit  Subjective    Patient ID: Alex FAIRCLOTH, male    DOB: 1961/09/11  Age: 62 y.o. MRN: [id number]  CC:  Chief Complaint  Patient presents with   Follow-up   Hypertension    HPI [name] presents for follow up of hypertension. Patient denies acute complaints or concerns.    Outpatient Encounter Medications as of 05/31/2023  Medication Sig   amLODipine (NORVASC) 10 MG tablet Take 1 tablet (10 mg total) by mouth daily.   chlorthalidone (HYGROTON) 25 MG tablet TAKE 1 TABLET BY MOUTH EVERY DAY   hydrALAZINE (APRESOLINE) 25 MG tablet Take 1 tablet (25 mg total) by mouth 2 (two) times daily.   irbesartan (AVAPRO) 300 MG tablet Take 1 tablet (300 mg total) by mouth daily.   Multiple Vitamins-Minerals (MULTIVITAMIN WITH MINERALS) tablet Take 1 tablet by mouth daily.   No facility-administered encounter medications on file as of 05/31/2023.    Past Medical History:  Diagnosis Date   Allergy    pollen   Arthritis    Diabetes mellitus without complication (HCC) Resolved 2014 after weight loss   Hyperlipidemia Resolved 2014 after weight loss   Hypertension    Neuromuscular disorder (HCC) 2018   pt states spinal cord injury due to spinal surgery, pt uses walker for mobility as of 02/20/21.   Weight loss     Past Surgical History:  Procedure Laterality Date   LAPAROSCOPIC GASTRIC SLEEVE RESECTION     LUMBAR LAMINECTOMY  12/01/2016   Lumbar decompression L2-5 LUMBAR LAMINECTOMY/DECOMPRESSION MICRODISCECTOMY 3 LEVELS (N/A)   LUMBAR LAMINECTOMY/DECOMPRESSION MICRODISCECTOMY N/A 12/01/2016   Procedure: Lumbar decompression L2-5 LUMBAR LAMINECTOMY/DECOMPRESSION MICRODISCECTOMY 3 LEVELS;  Surgeon: Venita Lick, MD;  Location: [facility];  Service: Orthopedics;  Laterality: N/A;   SPINE SURGERY N/A 2018   02/20/21- pt reports spinal cord injury during laminectemy , pt uses walker now    Family History  Problem Relation Age of Onset   Diabetes Mother     Hypertension Mother    Diabetes Father    Hypertension Father    Colon cancer Neg Hx    Colon polyps Neg Hx    Esophageal cancer Neg Hx    Rectal cancer Neg Hx    Stomach cancer Neg Hx     Social History   Socioeconomic History   Marital status: Married    Spouse name: Not on file   Number of children: 2   Years of education: Not on file   Highest education level: Some college, no degree  Occupational History   Occupation: Retired  Tobacco Use   Smoking status: Light Smoker    Types: Cigars   Smokeless tobacco: Never   Tobacco comments:    from time to time     Maybe twice a month  Vaping Use   Vaping status: Never Used  Substance and Sexual Activity   Alcohol use: Yes    Alcohol/week: 4.0 standard drinks of alcohol    Types: 2 Cans of beer, 2 Shots of liquor per week    Comment: drinks beer socially at times    Drug use: No   Sexual activity: Yes  Other Topics Concern   Not on file  Social History Narrative   Not on file   Social Determinants of Health   Financial Resource Strain: Low Risk  (05/19/2023)   Overall Financial Resource Strain (CARDIA)    Difficulty of Paying Living Expenses: Not hard at all  Food Insecurity:  No Food Insecurity (05/19/2023)   Hunger Vital Sign    Worried About Running Out of Food in the Last Year: Never true    Ran Out of Food in the Last Year: Never true  Transportation Needs: No Transportation Needs (05/19/2023)   PRAPARE - Administrator, Civil Service (Medical): No    Lack of Transportation (Non-Medical): No  Physical Activity: Insufficiently Active (05/19/2023)   Exercise Vital Sign    Days of Exercise per Week: 5 days    Minutes of Exercise per Session: 20 min  Stress: No Stress Concern Present (05/19/2023)   [employer] of Occupational Health - Occupational Stress Questionnaire    Feeling of Stress : Not at all  Social Connections: Socially Integrated (05/19/2023)   Social Connection and Isolation Panel  [NHANES]    Frequency of Communication with Friends and Family: More than three times a week    Frequency of Social Gatherings with Friends and Family: More than three times a week    Attends Religious Services: More than 4 times per year    Active Member of [employer] or Organizations: Yes    Attends Banker Meetings: More than 4 times per year    Marital Status: Married  Catering manager Violence: Not At Risk (05/05/2023)   Humiliation, Afraid, Rape, and Kick questionnaire    Fear of Current or Ex-Partner: No    Emotionally Abused: No    Physically Abused: No    Sexually Abused: No    Review of Systems  All other systems reviewed and are negative.       Objective    BP 114/78   Pulse 91   Temp 97.7 F (36.5 C) (Oral)   Resp 16   Wt 247 lb (112 kg)   SpO2 96%   BMI 27.83 kg/m   Physical Exam Vitals and nursing note reviewed.  Constitutional:      General: He is not in acute distress. Cardiovascular:     Rate and Rhythm: Normal rate and regular rhythm.  Pulmonary:     Effort: Pulmonary effort is normal.     Breath sounds: Normal breath sounds.  Abdominal:     Palpations: Abdomen is soft.     Tenderness: There is no abdominal tenderness.  Neurological:     General: No focal deficit present.     Mental Status: He is alert and oriented to person, place, and time.         Assessment & Plan:   1. Primary hypertension Appears stable. Continue    No follow-ups on file.   [name], MD

## 2023-07-02 ENCOUNTER — Other Ambulatory Visit: Payer: Self-pay | Admitting: Family Medicine

## 2023-07-02 DIAGNOSIS — I10 Essential (primary) hypertension: Secondary | ICD-10-CM

## 2023-07-05 NOTE — Telephone Encounter (Signed)
Requested medications are due for refill today.  yes  Requested medications are on the active medications list.  yes  Last refill. 01/28/2023 #90 1 rf for both  Future visit scheduled.   yes  Notes to clinic.  Labs are expired.    Requested Prescriptions  Pending Prescriptions Disp Refills   chlorthalidone (HYGROTON) 25 MG tablet [Pharmacy Med Name: CHLORTHALIDONE 25 MG TABLET] 90 tablet 1    Sig: TAKE 1 TABLET BY MOUTH EVERY DAY     Cardiovascular: Diuretics - Thiazide Failed - 07/02/2023  3:15 PM      Failed - Cr in normal range and within 180 days    Creatinine, Ser  Date Value Ref Range Status  03/02/2022 1.15 0.76 - 1.27 mg/dL Final         Failed - K in normal range and within 180 days    Potassium  Date Value Ref Range Status  03/02/2022 3.7 3.5 - 5.2 mmol/L Final         Failed - Na in normal range and within 180 days    Sodium  Date Value Ref Range Status  03/02/2022 141 134 - 144 mmol/L Final         Passed - Last BP in normal range    BP Readings from Last 1 Encounters:  05/31/23 114/78         Passed - Valid encounter within last 6 months    Recent Outpatient Visits           1 month ago Primary hypertension   [place] [facility], MD   1 month ago Primary hypertension   [place] [facility] [place], [place]   5 months ago Lumbar radiculopathy   [place] [facility], MD   10 months ago Lumbar radiculopathy   [place] [facility] at [facility], [facility] Po[facility], MD   1 year ago Lumbar radiculopathy   [place] [facility] at [facility], Lauris Poag, MD       Future Appointments             In 3 weeks Georganna Skeans, MD [facility] at [facility]             irbesartan (AVAPRO) 300 MG tablet [Pharmacy Med Name: IRBESARTAN 300 MG TABLET] 90 tablet 1    Sig: TAKE 1 TABLET BY MOUTH EVERY DAY     Cardiovascular:   Angiotensin Receptor Blockers Failed - 07/02/2023  3:15 PM      Failed - Cr in normal range and within 180 days    Creatinine, Ser  Date Value Ref Range Status  03/02/2022 1.15 0.76 - 1.27 mg/dL Final         Failed - K in normal range and within 180 days    Potassium  Date Value Ref Range Status  03/02/2022 3.7 3.5 - 5.2 mmol/L Final         Passed - Patient is not pregnant      Passed - Last BP in normal range    BP Readings from Last 1 Encounters:  05/31/23 114/78         Passed - Valid encounter within last 6 months    Recent Outpatient Visits           1 month ago Primary hypertension    [facility] at [facility], MD  1 month ago Primary hypertension   [place] [facility] [place], [place]   5 months ago Lumbar radiculopathy   [place] [facility], MD   10 months ago Lumbar radiculopathy    Primary Care at [facility], MD   1 year ago Lumbar radiculopathy    Primary Care at [facility], MD       Future Appointments             In 3 weeks Georganna Skeans, MD [facility] at [facility]

## 2023-08-01 ENCOUNTER — Ambulatory Visit (INDEPENDENT_AMBULATORY_CARE_PROVIDER_SITE_OTHER): Payer: Medicare HMO | Admitting: Family Medicine

## 2023-08-01 ENCOUNTER — Encounter: Payer: Self-pay | Admitting: Family Medicine

## 2023-08-01 VITALS — BP 129/81 | HR 81 | Temp 98.3°F | Resp 16 | Ht 79.0 in | Wt 286.4 lb

## 2023-08-01 DIAGNOSIS — Z1159 Encounter for screening for other viral diseases: Secondary | ICD-10-CM

## 2023-08-01 DIAGNOSIS — Z13220 Encounter for screening for lipoid disorders: Secondary | ICD-10-CM

## 2023-08-01 DIAGNOSIS — Z130 Encounter for screening for diseases of the blood and blood-forming organs and certain disorders involving the immune mechanism: Secondary | ICD-10-CM

## 2023-08-01 DIAGNOSIS — Z125 Encounter for screening for malignant neoplasm of prostate: Secondary | ICD-10-CM

## 2023-08-01 DIAGNOSIS — Z114 Encounter for screening for human immunodeficiency virus [HIV]: Secondary | ICD-10-CM | POA: Diagnosis not present

## 2023-08-01 DIAGNOSIS — Z0000 Encounter for general adult medical examination without abnormal findings: Secondary | ICD-10-CM | POA: Diagnosis not present

## 2023-08-01 DIAGNOSIS — Z1211 Encounter for screening for malignant neoplasm of colon: Secondary | ICD-10-CM

## 2023-08-01 NOTE — Progress Notes (Unsigned)
Follow up b/p check

## 2023-08-02 ENCOUNTER — Encounter: Payer: Self-pay | Admitting: Family Medicine

## 2023-08-02 LAB — COMPREHENSIVE METABOLIC PANEL
ALT: 18 IU/L (ref 0–44)
AST: 20 IU/L (ref 0–40)
Albumin: 4 g/dL (ref 3.9–4.9)
Alkaline Phosphatase: 63 IU/L (ref 44–121)
BUN/Creatinine Ratio: 14 (ref 10–24)
BUN: 15 mg/dL (ref 8–27)
Bilirubin Total: 0.6 mg/dL (ref 0.0–1.2)
CO2: 23 mmol/L (ref 20–29)
Calcium: 9.4 mg/dL (ref 8.6–10.2)
Chloride: 100 mmol/L (ref 96–106)
Creatinine, Ser: 1.07 mg/dL (ref 0.76–1.27)
Globulin, Total: 2.6 g/dL (ref 1.5–4.5)
Glucose: 75 mg/dL (ref 70–99)
Potassium: 3.8 mmol/L (ref 3.5–5.2)
Sodium: 141 mmol/L (ref 134–144)
Total Protein: 6.6 g/dL (ref 6.0–8.5)
eGFR: 78 mL/min/{1.73_m2} (ref >59)

## 2023-08-02 LAB — CBC WITH DIFFERENTIAL/PLATELET
Basophils Absolute: 0 10*3/uL (ref 0.0–0.2)
Basos: 1 %
EOS (ABSOLUTE): 0.1 10*3/uL (ref 0.0–0.4)
Eos: 2 %
Hematocrit: 46.6 % (ref 37.5–51.0)
Hemoglobin: 14.7 g/dL (ref 13.0–17.7)
Immature Grans (Abs): 0 10*3/uL (ref 0.0–0.1)
Immature Granulocytes: 0 %
Lymphocytes Absolute: 1.6 10*3/uL (ref 0.7–3.1)
Lymphs: 35 %
MCH: 29.2 pg (ref 26.6–33.0)
MCHC: 31.5 g/dL (ref 31.5–35.7)
MCV: 93 fL (ref 79–97)
Monocytes Absolute: 0.5 10*3/uL (ref 0.1–0.9)
Monocytes: 11 %
Neutrophils Absolute: 2.4 10*3/uL (ref 1.4–7.0)
Neutrophils: 51 %
Platelets: 194 10*3/uL (ref 150–450)
RBC: 5.04 x10E6/uL (ref 4.14–5.80)
RDW: 15.2 % (ref 11.6–15.4)
WBC: 4.7 10*3/uL (ref 3.4–10.8)

## 2023-08-02 LAB — HIV ANTIBODY (ROUTINE TESTING W REFLEX): HIV Screen 4th Generation wRfx: NONREACTIVE

## 2023-08-02 LAB — HEPATITIS C ANTIBODY: Hep C Virus Ab: NONREACTIVE

## 2023-08-02 LAB — PSA: Prostate Specific Ag, Serum: 1.2 ng/mL (ref 0.0–4.0)

## 2023-08-02 NOTE — Progress Notes (Signed)
Established Patient Office Visit  Subjective    [name], male    DOB: 07-31-1961  Age: 62 y.o. MRN: [id number]  CC:  Chief Complaint  Patient presents with   Follow-up    B/p check    HPI [name] presents for routine annual exam. Patient denies acute complaints or concerns.   Outpatient Encounter Medications as of 08/01/2023  Medication Sig   amLODipine (NORVASC) 10 MG tablet Take 1 tablet (10 mg total) by mouth daily.   chlorthalidone (HYGROTON) 25 MG tablet TAKE 1 TABLET BY MOUTH EVERY DAY   hydrALAZINE (APRESOLINE) 25 MG tablet Take 1 tablet (25 mg total) by mouth 2 (two) times daily.   irbesartan (AVAPRO) 300 MG tablet Take 1 tablet (300 mg total) by mouth daily.   Multiple Vitamins-Minerals (MULTIVITAMIN WITH MINERALS) tablet Take 1 tablet by mouth daily.   No facility-administered encounter medications on file as of 08/01/2023.    Past Medical History:  Diagnosis Date   Allergy    pollen   Arthritis    Diabetes mellitus without complication (HCC) Resolved 2014 after weight loss   Hyperlipidemia Resolved 2014 after weight loss   Hypertension    Neuromuscular disorder (HCC) 2018   pt states spinal cord injury due to spinal surgery, pt uses walker for mobility as of 02/20/21.   Weight loss     Past Surgical History:  Procedure Laterality Date   LAPAROSCOPIC GASTRIC SLEEVE RESECTION     LUMBAR LAMINECTOMY  12/01/2016   Lumbar decompression L2-5 LUMBAR LAMINECTOMY/DECOMPRESSION MICRODISCECTOMY 3 LEVELS (N/A)   LUMBAR LAMINECTOMY/DECOMPRESSION MICRODISCECTOMY N/A 12/01/2016   Procedure: Lumbar decompression L2-5 LUMBAR LAMINECTOMY/DECOMPRESSION MICRODISCECTOMY 3 LEVELS;  Surgeon: Venita Lick, MD;  Location: [facility] OR;  Service: Orthopedics;  Laterality: N/A;   SPINE SURGERY N/A 2018   02/20/21- pt reports spinal cord injury during laminectemy , pt uses walker now    Family History  Problem Relation Age of Onset   Diabetes Mother    Hypertension  Mother    Diabetes Father    Hypertension Father    Colon cancer Neg Hx    Colon polyps Neg Hx    Esophageal cancer Neg Hx    Rectal cancer Neg Hx    Stomach cancer Neg Hx     Social History   Socioeconomic History   Marital status: Married    Spouse name: Not on file   Number of children: 2   Years of education: Not on file   Highest education level: Some college, no degree  Occupational History   Occupation: Retired  Tobacco Use   Smoking status: Light Smoker    Types: Cigars   Smokeless tobacco: Never   Tobacco comments:    from time to time     Maybe twice a month  Vaping Use   Vaping status: Never Used  Substance and Sexual Activity   Alcohol use: Yes    Alcohol/week: 4.0 standard drinks of alcohol    Types: 2 Cans of beer, 2 Shots of liquor per week    Comment: drinks beer socially at times    Drug use: No   Sexual activity: Yes  Other Topics Concern   Not on file  Social History Narrative   Not on file   Social Determinants of Health   Financial Resource Strain: Low Risk  (05/19/2023)   Overall Financial Resource Strain (CARDIA)    Difficulty of Paying Living Expenses: Not hard at all  Food Insecurity:  No Food Insecurity (05/19/2023)   Hunger Vital Sign    Worried About Running Out of Food in the Last Year: Never true    Ran Out of Food in the Last Year: Never true  Transportation Needs: No Transportation Needs (05/19/2023)   PRAPARE - Administrator, Civil Service (Medical): No    Lack of Transportation (Non-Medical): No  Physical Activity: Insufficiently Active (05/19/2023)   Exercise Vital Sign    Days of Exercise per Week: 5 days    Minutes of Exercise per Session: 20 min  Stress: No Stress Concern Present (05/19/2023)   [employer] of Occupational Health - Occupational Stress Questionnaire    Feeling of Stress : Not at all  Social Connections: Socially Integrated (05/19/2023)   Social Connection and Isolation Panel [NHANES]     Frequency of Communication with Friends and Family: More than three times a week    Frequency of Social Gatherings with Friends and Family: More than three times a week    Attends Religious Services: More than 4 times per year    Active Member of [employer] or Organizations: Yes    Attends Banker Meetings: More than 4 times per year    Marital Status: Married  Catering manager Violence: Not At Risk (05/05/2023)   Humiliation, Afraid, Rape, and Kick questionnaire    Fear of Current or Ex-Partner: No    Emotionally Abused: No    Physically Abused: No    Sexually Abused: No    Review of Systems  All other systems reviewed and are negative.       Objective    BP 129/81 (BP Location: Right Arm, Patient Position: Sitting, Cuff Size: Large)   Pulse 81   Temp 98.3 F (36.8 C) (Oral)   Resp 16   Ht 6\' 7"  (2.007 m)   Wt 286 lb 6.4 oz (129.9 kg)   SpO2 95%   BMI 32.26 kg/m   Physical Exam Vitals and nursing note reviewed.  Constitutional:      General: He is not in acute distress. HENT:     Head: Normocephalic and atraumatic.     Right Ear: Tympanic membrane, ear canal and external ear normal.     Left Ear: Tympanic membrane, ear canal and external ear normal.     Nose: Nose normal.     Mouth/Throat:     Mouth: Mucous membranes are moist.     Pharynx: Oropharynx is clear.  Eyes:     Conjunctiva/sclera: Conjunctivae normal.     Pupils: Pupils are equal, round, and reactive to light.  Neck:     Thyroid: No thyromegaly.  Cardiovascular:     Rate and Rhythm: Normal rate and regular rhythm.     Heart sounds: Normal heart sounds. No murmur heard. Pulmonary:     Effort: Pulmonary effort is normal.     Breath sounds: Normal breath sounds.  Abdominal:     General: There is no distension.     Palpations: Abdomen is soft. There is no mass.     Tenderness: There is no abdominal tenderness.     Hernia: There is no hernia in the left inguinal area or right inguinal area.   Genitourinary:    Penis: Normal.      Testes: Normal.  Musculoskeletal:        General: Normal range of motion.     Cervical back: Normal range of motion and neck supple.     Right lower leg: No  edema.     Left lower leg: No edema.  Skin:    General: Skin is warm and dry.  Neurological:     General: No focal deficit present.     Mental Status: He is alert and oriented to person, place, and time. Mental status is at baseline.  Psychiatric:        Mood and Affect: Mood normal.        Behavior: Behavior normal.         Assessment & Plan:   Annual physical exam -     Comprehensive metabolic panel  Screening for deficiency anemia -     CBC with Differential/Platelet  Screening for lipid disorders  Screening for colon cancer -     Ambulatory referral to Gastroenterology  Screening for HIV (human immunodeficiency virus) -     HIV Antibody (routine testing w rflx)  Need for hepatitis C screening test -     Hepatitis C antibody  Screening for prostate cancer -     PSA     No follow-ups on file.   [name], MD

## 2023-08-17 ENCOUNTER — Encounter: Payer: Self-pay | Admitting: Internal Medicine

## 2023-08-30 ENCOUNTER — Encounter: Payer: Self-pay | Admitting: Internal Medicine

## 2023-08-30 ENCOUNTER — Ambulatory Visit (AMBULATORY_SURGERY_CENTER): Payer: Medicare HMO | Admitting: *Deleted

## 2023-08-30 VITALS — Ht 79.0 in | Wt 280.0 lb

## 2023-08-30 DIAGNOSIS — Z860100 Personal history of colon polyps, unspecified: Secondary | ICD-10-CM

## 2023-08-30 MED ORDER — NA SULFATE-K SULFATE-MG SULF 17.5-3.13-1.6 GM/177ML PO SOLN
1.0000 | Freq: Once | ORAL | 0 refills | Status: AC
Start: 2023-08-30 — End: 2023-08-30

## 2023-08-30 NOTE — Progress Notes (Signed)
[name] name and DOB verified at the beginning of the pre-visit wit 2 identifiers  Pt denies any difficulty with ambulating,sitting, laying down or rolling side to side  Gave both [facility] main # and MD on call # prior to instructions.   No egg or soy allergy known to patient   No issues known to pt with past sedation with any surgeries or procedures  Pt denies having issues being intubated  Pt has no issues moving head neck or swallowing  No FH of Malignant Hyperthermia  Pt is not on diet pills or shots  Pt is not on home 02   Pt is not on blood thinners   Pt denies issues with constipation    Pt is not on dialysis  Pt denise any abnormal heart rhythms   Pt denies any upcoming cardiac testing  Pt encouraged to use to use Singlecare or Goodrx to reduce cost   Patient's chart reviewed by Cathlyn Parsons CNRA prior to pre-visit and patient appropriate for the [facility].  Pre-visit completed and red dot placed by patient's name on their procedure day (on provider's schedule).  .  Visit by phone  Pt states weight is 280 lb  Instructed pt why it is important to and  to call if they have any changes in health or new medications. Directed them to the # given and on instructions.     Instructions reviewed with pt and pt states understanding. Instructed to review again prior to procedure. Pt states they will.   Instructions sent by mail with coupon and by my chart

## 2023-09-09 ENCOUNTER — Other Ambulatory Visit: Payer: Self-pay | Admitting: Family Medicine

## 2023-09-09 DIAGNOSIS — I10 Essential (primary) hypertension: Secondary | ICD-10-CM

## 2023-09-09 NOTE — Telephone Encounter (Signed)
E-Prescribing Status: Transmission to pharmacy failed (09/06/2023 11:01 AM EDT). Resending irbesartan to [facility] pharmacy.  Requested Prescriptions  Pending Prescriptions Disp Refills   irbesartan (AVAPRO) 300 MG tablet [Pharmacy Med Name: IRBESARTAN 300 MG TABLET] 90 tablet 0    Sig: TAKE 1 TABLET BY MOUTH EVERY DAY     Cardiovascular:  Angiotensin Receptor Blockers Passed - 09/09/2023  1:35 AM      Passed - Cr in normal range and within 180 days    Creatinine, Ser  Date Value Ref Range Status  08/01/2023 1.07 0.76 - 1.27 mg/dL Final         Passed - K in normal range and within 180 days    Potassium  Date Value Ref Range Status  08/01/2023 3.8 3.5 - 5.2 mmol/L Final         Passed - Patient is not pregnant      Passed - Last BP in normal range    BP Readings from Last 1 Encounters:  08/01/23 129/81         Passed - Valid encounter within last 6 months    Recent Outpatient Visits           1 month ago Annual physical exam   [place] [facility], MD   3 months ago Primary hypertension   [place] [facility], MD   3 months ago Primary hypertension   [place] [facility] [place], [place]   7 months ago Lumbar radiculopathy   [place] [facility], MD   1 year ago Lumbar radiculopathy   [place] [facility], MD       Future Appointments             In 4 months Georganna Skeans, MD [facility] at [facility]

## 2023-09-15 ENCOUNTER — Encounter: Payer: Medicare HMO | Admitting: Internal Medicine

## 2023-09-22 ENCOUNTER — Ambulatory Visit: Payer: Medicare HMO | Admitting: Internal Medicine

## 2023-09-22 ENCOUNTER — Encounter: Payer: Self-pay | Admitting: Internal Medicine

## 2023-09-22 VITALS — BP 125/69 | HR 49 | Temp 97.5°F | Resp 12 | Ht 79.0 in | Wt 280.0 lb

## 2023-09-22 DIAGNOSIS — D122 Benign neoplasm of ascending colon: Secondary | ICD-10-CM | POA: Diagnosis not present

## 2023-09-22 DIAGNOSIS — Z09 Encounter for follow-up examination after completed treatment for conditions other than malignant neoplasm: Secondary | ICD-10-CM

## 2023-09-22 DIAGNOSIS — I10 Essential (primary) hypertension: Secondary | ICD-10-CM | POA: Diagnosis not present

## 2023-09-22 DIAGNOSIS — D123 Benign neoplasm of transverse colon: Secondary | ICD-10-CM

## 2023-09-22 DIAGNOSIS — K635 Polyp of colon: Secondary | ICD-10-CM | POA: Diagnosis not present

## 2023-09-22 DIAGNOSIS — Z860100 Personal history of colon polyps, unspecified: Secondary | ICD-10-CM

## 2023-09-22 MED ORDER — SODIUM CHLORIDE 0.9 % IV SOLN: 500.0000 mL | INTRAVENOUS | Status: DC

## 2023-09-22 NOTE — Patient Instructions (Signed)

## 2023-09-22 NOTE — Progress Notes (Signed)
Vss nad trans to pacu 

## 2023-09-22 NOTE — Progress Notes (Signed)
HISTORY OF PRESENT ILLNESS:  [name] is a 62 y.o. male who underwent colonoscopy for the purposes of screening April 2022.  The examination was compromised by poor prep.  He was noted to have a small tubular adenoma which was removed.  Offer follow-up.  REVIEW OF SYSTEMS:  All non-GI ROS negative except for  Past Medical History:  Diagnosis Date   Hypertension    Neuromuscular disorder (HCC) 2018   pt states spinal cord injury due to spinal surgery, pt uses walker for mobility as of 02/20/21.   Weight loss     Past Surgical History:  Procedure Laterality Date   COLONOSCOPY     LAPAROSCOPIC GASTRIC SLEEVE RESECTION     LUMBAR LAMINECTOMY  12/01/2016   Lumbar decompression L2-5 LUMBAR LAMINECTOMY/DECOMPRESSION MICRODISCECTOMY 3 LEVELS (N/A)   LUMBAR LAMINECTOMY/DECOMPRESSION MICRODISCECTOMY N/A 12/01/2016   Procedure: Lumbar decompression L2-5 LUMBAR LAMINECTOMY/DECOMPRESSION MICRODISCECTOMY 3 LEVELS;  Surgeon: Venita Lick, MD;  Location: [facility] OR;  Service: Orthopedics;  Laterality: N/A;   SPINE SURGERY N/A 2018   02/20/21- pt reports spinal cord injury during laminectemy , pt uses walker now    Social History Airik F Koob  reports that he has been smoking cigars. He has never used smokeless tobacco. He reports current alcohol use of about 4.0 standard drinks of alcohol per week. He reports that he does not use drugs.  family history includes Diabetes in his father and mother; Hypertension in his father and mother.  No Known Allergies     PHYSICAL EXAMINATION:  Vital signs: BP (!) 149/87   Pulse (!) 48   Temp (!) 97.5 F (36.4 C)   Resp 10   Ht 6\' 7"  (2.007 m)   Wt 280 lb (127 kg)   SpO2 100%   BMI 31.54 kg/m  General: Well-developed, well-nourished, no acute distress HEENT: Sclerae are anicteric, conjunctiva pink. Oral mucosa intact Lungs: Clear Heart: Regular Abdomen: soft, nontender, nondistended, no obvious ascites, no peritoneal signs, normal bowel sounds. No  organomegaly. Extremities: No edema Psychiatric: alert and oriented x3. Cooperative    ASSESSMENT:  1.  History of diminutive adenoma 2.  Index colonoscopy with poor prep   PLAN:  Surveillance colonoscopy with more extensive prep

## 2023-09-22 NOTE — Progress Notes (Signed)
Called to room to assist during endoscopic procedure.  Patient ID and intended procedure confirmed with present staff. Received instructions for my participation in the procedure from the performing physician.  

## 2023-09-22 NOTE — Op Note (Signed)
[facility] Patient Name: [name] Procedure Date: 09/22/2023 3:41 PM MRN: [id number] Endoscopist: Wilhemina Bonito. Marina Goodell , MD, [id number] Age: 62 Referring MD:  Date of Birth: 1960/12/16 Gender: Male Account #: [id number] Procedure:                Colonoscopy with cold snare polypectomy x 3 Indications:              High risk colon cancer surveillance: Personal                            history of non-advanced adenoma. Index exam April                            2022 was compromised by poor prep. Medicines:                Monitored Anesthesia Care Procedure:                Pre-Anesthesia Assessment:                           - Prior to the procedure, a History and Physical                            was performed, and patient medications and                            allergies were reviewed. The patient's tolerance of                            previous anesthesia was also reviewed. The risks                            and benefits of the procedure and the sedation                            options and risks were discussed with the patient.                            All questions were answered, and informed consent                            was obtained. Prior Anticoagulants: The patient has                            taken no anticoagulant or antiplatelet agents. ASA                            Grade Assessment: II - A patient with mild systemic                            disease. After reviewing the risks and benefits,                            the patient was deemed in satisfactory condition to  undergo the procedure.                           After obtaining informed consent, the colonoscope                            was passed under direct vision. Throughout the                            procedure, the patient's blood pressure, pulse, and                            oxygen saturations were monitored continuously. The                             CF [id number] #[id number] was introduced through the anus                            and advanced to the the cecum, identified by                            appendiceal orifice and ileocecal valve. The                            ileocecal valve, appendiceal orifice, and rectum                            were photographed. The quality of the bowel                            preparation was excellent. The colonoscopy was                            performed without difficulty. The patient tolerated                            the procedure well. The bowel preparation used was                            EXTENSIVE with MiraLAX followed by SUPREP via split                            dose instruction. Scope In: 3:52:33 PM Scope Out: 4:06:42 PM Scope Withdrawal Time: 0 hours 8 minutes 48 seconds  Total Procedure Duration: 0 hours 14 minutes 9 seconds  Findings:                 Three polyps were found in the transverse colon and                            ascending colon. The polyps were 3 to 8 mm in size.                            These polyps were removed with a cold snare.  Resection and retrieval were complete.                           A few diverticula were found in the ascending colon.                           The exam was otherwise without abnormality on                            direct and retroflexion views. Complications:            No immediate complications. Estimated blood loss:                            None. Estimated Blood Loss:     Estimated blood loss: none. Impression:               - Three 3 to 8 mm polyps in the transverse colon                            and in the ascending colon, removed with a cold                            snare. Resected and retrieved.                           - Diverticulosis in the ascending colon.                           - The examination was otherwise normal on direct                            and retroflexion  views. Recommendation:           - Repeat colonoscopy in 5 years for surveillance.                            SAME EXTENSIVE PREP.                           - Patient has a contact number available for                            emergencies. The signs and symptoms of potential                            delayed complications were discussed with the                            patient. Return to normal activities tomorrow.                            Written discharge instructions were provided to the                            patient.                           -  Resume previous diet.                           - Continue present medications.                           - Await pathology results. Wilhemina Bonito. Marina Goodell, MD 09/22/2023 4:13:04 PM This report has been signed electronically.

## 2023-09-23 ENCOUNTER — Telehealth: Payer: Self-pay

## 2023-09-23 NOTE — Telephone Encounter (Signed)
Left message on answering machine. 

## 2023-09-27 ENCOUNTER — Encounter: Payer: Self-pay | Admitting: Internal Medicine

## 2023-09-27 LAB — SURGICAL PATHOLOGY

## 2023-10-08 ENCOUNTER — Other Ambulatory Visit: Payer: Self-pay | Admitting: Family Medicine

## 2023-10-08 DIAGNOSIS — I10 Essential (primary) hypertension: Secondary | ICD-10-CM

## 2023-10-09 ENCOUNTER — Other Ambulatory Visit: Payer: Self-pay | Admitting: Family Medicine

## 2023-10-09 DIAGNOSIS — I10 Essential (primary) hypertension: Secondary | ICD-10-CM

## 2023-12-02 DIAGNOSIS — N529 Male erectile dysfunction, unspecified: Secondary | ICD-10-CM | POA: Diagnosis not present

## 2023-12-05 ENCOUNTER — Telehealth: Payer: Self-pay | Admitting: Family Medicine

## 2023-12-05 NOTE — Telephone Encounter (Signed)
 Marland Kitchen

## 2023-12-06 ENCOUNTER — Other Ambulatory Visit: Payer: Self-pay | Admitting: Family Medicine

## 2023-12-06 DIAGNOSIS — I10 Essential (primary) hypertension: Secondary | ICD-10-CM

## 2024-01-30 ENCOUNTER — Ambulatory Visit (INDEPENDENT_AMBULATORY_CARE_PROVIDER_SITE_OTHER): Payer: Medicare HMO | Admitting: Family Medicine

## 2024-01-30 DIAGNOSIS — I10 Essential (primary) hypertension: Secondary | ICD-10-CM

## 2024-01-30 MED ORDER — HYDRALAZINE HCL 50 MG PO TABS: 50.0000 mg | ORAL_TABLET | Freq: Two times a day (BID) | ORAL | 1 refills | Status: DC

## 2024-01-30 MED ORDER — IRBESARTAN 300 MG PO TABS: 300.0000 mg | ORAL_TABLET | Freq: Every day | ORAL | 1 refills | Status: DC

## 2024-01-30 MED ORDER — AMLODIPINE BESYLATE 10 MG PO TABS: 10.0000 mg | ORAL_TABLET | Freq: Every day | ORAL | 1 refills | Status: DC

## 2024-01-30 MED ORDER — CHLORTHALIDONE 25 MG PO TABS: ORAL_TABLET | ORAL | 1 refills | Status: DC

## 2024-01-30 MED ORDER — HYDRALAZINE HCL 25 MG PO TABS: 25.0000 mg | ORAL_TABLET | Freq: Two times a day (BID) | ORAL | 1 refills | Status: DC

## 2024-01-30 NOTE — Progress Notes (Unsigned)
 Patient is here for their 6 month follow-up Patient has no concerns today Care gaps have been discussed with patient

## 2024-01-31 ENCOUNTER — Encounter: Payer: Self-pay | Admitting: Family Medicine

## 2024-01-31 NOTE — Progress Notes (Signed)
 Established Patient Office Visit  Subjective    [name], male    DOB: 10/19/1961  Age: 63 y.o. MRN: [id number]  CC:  Chief Complaint  Patient presents with   Medical Management of Chronic Issues    HPI [name] presents for for follow up of hypertension. Patient denies acute complaints or concerns.   Outpatient Encounter Medications as of 01/30/2024  Medication Sig   hydrALAZINE (APRESOLINE) 50 MG tablet Take 1 tablet (50 mg total) by mouth in the morning and at bedtime.   tadalafil (CIALIS) 20 MG tablet Take 20 mg by mouth daily as needed.   tadalafil (CIALIS) 5 MG tablet Take 5 mg by mouth daily.   amLODipine (NORVASC) 10 MG tablet Take 1 tablet (10 mg total) by mouth daily.   chlorthalidone (HYGROTON) 25 MG tablet TAKE 1 TABLET BY MOUTH EVERY DAY   irbesartan (AVAPRO) 300 MG tablet Take 1 tablet (300 mg total) by mouth daily.   Multiple Vitamins-Minerals (MULTIVITAMIN WITH MINERALS) tablet Take 1 tablet by mouth daily.   [DISCONTINUED] amLODipine (NORVASC) 10 MG tablet TAKE 1 TABLET BY MOUTH EVERY DAY   [DISCONTINUED] chlorthalidone (HYGROTON) 25 MG tablet TAKE 1 TABLET BY MOUTH EVERY DAY   [DISCONTINUED] hydrALAZINE (APRESOLINE) 25 MG tablet TAKE 1 TABLET BY MOUTH TWICE A DAY   [DISCONTINUED] hydrALAZINE (APRESOLINE) 25 MG tablet Take 1 tablet (25 mg total) by mouth 2 (two) times daily.   [DISCONTINUED] irbesartan (AVAPRO) 300 MG tablet TAKE 1 TABLET BY MOUTH EVERY DAY   No facility-administered encounter medications on file as of 01/30/2024.    Past Medical History:  Diagnosis Date   Hypertension    Neuromuscular disorder (HCC) 2018   pt states spinal cord injury due to spinal surgery, pt uses walker for mobility as of 02/20/21.   Weight loss     Past Surgical History:  Procedure Laterality Date   COLONOSCOPY     LAPAROSCOPIC GASTRIC SLEEVE RESECTION     LUMBAR LAMINECTOMY  12/01/2016   Lumbar decompression L2-5 LUMBAR LAMINECTOMY/DECOMPRESSION  MICRODISCECTOMY 3 LEVELS (N/A)   LUMBAR LAMINECTOMY/DECOMPRESSION MICRODISCECTOMY N/A 12/01/2016   Procedure: Lumbar decompression L2-5 LUMBAR LAMINECTOMY/DECOMPRESSION MICRODISCECTOMY 3 LEVELS;  Surgeon: Venita Lick, MD;  Location: [facility] OR;  Service: Orthopedics;  Laterality: N/A;   SPINE SURGERY N/A 2018   02/20/21- pt reports spinal cord injury during laminectemy , pt uses walker now    Family History  Problem Relation Age of Onset   Diabetes Mother    Hypertension Mother    Diabetes Father    Hypertension Father    Colon cancer Neg Hx    Colon polyps Neg Hx    Esophageal cancer Neg Hx    Rectal cancer Neg Hx    Stomach cancer Neg Hx     Social History   Socioeconomic History   Marital status: Married    Spouse name: Not on file   Number of children: 2   Years of education: Not on file   Highest education level: Some college, no degree  Occupational History   Occupation: Retired  Tobacco Use   Smoking status: Light Smoker    Types: Cigars   Smokeless tobacco: Never   Tobacco comments:    from time to time     Maybe twice a month  Vaping Use   Vaping status: Never Used  Substance and Sexual Activity   Alcohol use: Yes    Alcohol/week: 4.0 standard drinks of alcohol  Types: 2 Cans of beer, 2 Shots of liquor per week    Comment: drinks beer socially at times    Drug use: No   Sexual activity: Yes  Other Topics Concern   Not on file  Social History Narrative   Not on file   Social Drivers of Health   Financial Resource Strain: Low Risk  (01/26/2024)   Overall Financial Resource Strain (CARDIA)    Difficulty of Paying Living Expenses: Not hard at all  Food Insecurity: No Food Insecurity (01/26/2024)   Hunger Vital Sign    Worried About Running Out of Food in the Last Year: Never true    Ran Out of Food in the Last Year: Never true  Transportation Needs: No Transportation Needs (01/26/2024)   PRAPARE - Administrator, Civil Service (Medical): No     Lack of Transportation (Non-Medical): No  Physical Activity: Sufficiently Active (01/26/2024)   Exercise Vital Sign    Days of Exercise per Week: 5 days    Minutes of Exercise per Session: 120 min  Stress: No Stress Concern Present (01/26/2024)   [employer] of Occupational Health - Occupational Stress Questionnaire    Feeling of Stress : Not at all  Social Connections: Socially Integrated (01/26/2024)   Social Connection and Isolation Panel [NHANES]    Frequency of Communication with Friends and Family: More than three times a week    Frequency of Social Gatherings with Friends and Family: More than three times a week    Attends Religious Services: More than 4 times per year    Active Member of [employer] or Organizations: Yes    Attends Banker Meetings: More than 4 times per year    Marital Status: Married  Catering manager Violence: Not At Risk (05/05/2023)   Humiliation, Afraid, Rape, and Kick questionnaire    Fear of Current or Ex-Partner: No    Emotionally Abused: No    Physically Abused: No    Sexually Abused: No    Review of Systems  All other systems reviewed and are negative.       Objective    BP (!) 166/104   Pulse 66   Temp 98.1 F (36.7 C) (Oral)   Resp 16   Wt 285 lb (129.3 kg)   SpO2 97%   BMI 32.11 kg/m   Physical Exam Vitals and nursing note reviewed.  Constitutional:      General: He is not in acute distress. Cardiovascular:     Rate and Rhythm: Normal rate and regular rhythm.  Pulmonary:     Effort: Pulmonary effort is normal.     Breath sounds: Normal breath sounds.  Abdominal:     Palpations: Abdomen is soft.     Tenderness: There is no abdominal tenderness.  Neurological:     General: No focal deficit present.     Mental Status: He is alert and oriented to person, place, and time.         Assessment & Plan:   1. Uncontrolled hypertension Elevated readings meds refilled. Hydralazine increased from 25 mg to 50 mg BID -  amLODipine (NORVASC) 10 MG tablet; Take 1 tablet (10 mg total) by mouth daily.  Dispense: 90 tablet; Refill: 1 - chlorthalidone (HYGROTON) 25 MG tablet; TAKE 1 TABLET BY MOUTH EVERY DAY  Dispense: 90 tablet; Refill: 1 - irbesartan (AVAPRO) 300 MG tablet; Take 1 tablet (300 mg total) by mouth daily.  Dispense: 90 tablet; Refill: 1    Return  in about 2 weeks (around 02/13/2024) for follow up.   [name], MD

## 2024-02-16 ENCOUNTER — Encounter: Payer: Self-pay | Admitting: Family Medicine

## 2024-02-16 ENCOUNTER — Ambulatory Visit (INDEPENDENT_AMBULATORY_CARE_PROVIDER_SITE_OTHER): Admitting: Family Medicine

## 2024-02-16 VITALS — BP 134/81 | HR 76 | Resp 16 | Wt 278.0 lb

## 2024-02-16 DIAGNOSIS — I10 Essential (primary) hypertension: Secondary | ICD-10-CM | POA: Diagnosis not present

## 2024-02-16 DIAGNOSIS — M5416 Radiculopathy, lumbar region: Secondary | ICD-10-CM

## 2024-02-16 NOTE — Progress Notes (Signed)
Patient is here for 2wk follow-up BP Patent has uncontrolled hypertension Provider is  aware of readings  

## 2024-02-16 NOTE — Progress Notes (Signed)
 Established Patient Office Visit  Subjective    [name], male    DOB: Feb 12, 1961  Age: 63 y.o. MRN: [id number]  CC:  Chief Complaint  Patient presents with   Medical Management of Chronic Issues    HPI [name] presents for follow up of hypertension. Patient reports med compliance and denies acute complaints.   Outpatient Encounter Medications as of 02/16/2024  Medication Sig   amLODipine (NORVASC) 10 MG tablet Take 1 tablet (10 mg total) by mouth daily.   chlorthalidone (HYGROTON) 25 MG tablet TAKE 1 TABLET BY MOUTH EVERY DAY   hydrALAZINE (APRESOLINE) 50 MG tablet Take 1 tablet (50 mg total) by mouth in the morning and at bedtime.   irbesartan (AVAPRO) 300 MG tablet Take 1 tablet (300 mg total) by mouth daily.   Multiple Vitamins-Minerals (MULTIVITAMIN WITH MINERALS) tablet Take 1 tablet by mouth daily.   tadalafil (CIALIS) 20 MG tablet Take 20 mg by mouth daily as needed.   tadalafil (CIALIS) 5 MG tablet Take 5 mg by mouth daily.   No facility-administered encounter medications on file as of 02/16/2024.    Past Medical History:  Diagnosis Date   Hypertension    Neuromuscular disorder (HCC) 2018   pt states spinal cord injury due to spinal surgery, pt uses walker for mobility as of 02/20/21.   Weight loss     Past Surgical History:  Procedure Laterality Date   COLONOSCOPY     LAPAROSCOPIC GASTRIC SLEEVE RESECTION     LUMBAR LAMINECTOMY  12/01/2016   Lumbar decompression L2-5 LUMBAR LAMINECTOMY/DECOMPRESSION MICRODISCECTOMY 3 LEVELS (N/A)   LUMBAR LAMINECTOMY/DECOMPRESSION MICRODISCECTOMY N/A 12/01/2016   Procedure: Lumbar decompression L2-5 LUMBAR LAMINECTOMY/DECOMPRESSION MICRODISCECTOMY 3 LEVELS;  Surgeon: Venita Lick, MD;  Location: [facility] OR;  Service: Orthopedics;  Laterality: N/A;   SPINE SURGERY N/A 2018   02/20/21- pt reports spinal cord injury during laminectemy , pt uses walker now    Family History  Problem Relation Age of Onset   Diabetes  Mother    Hypertension Mother    Diabetes Father    Hypertension Father    Colon cancer Neg Hx    Colon polyps Neg Hx    Esophageal cancer Neg Hx    Rectal cancer Neg Hx    Stomach cancer Neg Hx     Social History   Socioeconomic History   Marital status: Married    Spouse name: Not on file   Number of children: 2   Years of education: Not on file   Highest education level: Some college, no degree  Occupational History   Occupation: Retired  Tobacco Use   Smoking status: Light Smoker    Types: Cigars   Smokeless tobacco: Never   Tobacco comments:    from time to time     Maybe twice a month  Vaping Use   Vaping status: Never Used  Substance and Sexual Activity   Alcohol use: Yes    Alcohol/week: 4.0 standard drinks of alcohol    Types: 2 Cans of beer, 2 Shots of liquor per week    Comment: drinks beer socially at times    Drug use: No   Sexual activity: Yes  Other Topics Concern   Not on file  Social History Narrative   Not on file   Social Drivers of Health   Financial Resource Strain: Low Risk  (01/26/2024)   Overall Financial Resource Strain (CARDIA)    Difficulty of Paying Living Expenses: Not  hard at all  Food Insecurity: No Food Insecurity (01/26/2024)   Hunger Vital Sign    Worried About Running Out of Food in the Last Year: Never true    Ran Out of Food in the Last Year: Never true  Transportation Needs: No Transportation Needs (01/26/2024)   PRAPARE - Administrator, Civil Service (Medical): No    Lack of Transportation (Non-Medical): No  Physical Activity: Sufficiently Active (01/26/2024)   Exercise Vital Sign    Days of Exercise per Week: 5 days    Minutes of Exercise per Session: 120 min  Stress: No Stress Concern Present (01/26/2024)   [employer] of Occupational Health - Occupational Stress Questionnaire    Feeling of Stress : Not at all  Social Connections: Socially Integrated (01/26/2024)   Social Connection and Isolation Panel  [NHANES]    Frequency of Communication with Friends and Family: More than three times a week    Frequency of Social Gatherings with Friends and Family: More than three times a week    Attends Religious Services: More than 4 times per year    Active Member of [employer] or Organizations: Yes    Attends Banker Meetings: More than 4 times per year    Marital Status: Married  Catering manager Violence: Not At Risk (05/05/2023)   Humiliation, Afraid, Rape, and Kick questionnaire    Fear of Current or Ex-Partner: No    Emotionally Abused: No    Physically Abused: No    Sexually Abused: No    Review of Systems  All other systems reviewed and are negative.       Objective    BP 134/81   Pulse 76   Resp 16   Wt 278 lb (126.1 kg)   SpO2 98%   BMI 31.32 kg/m   Physical Exam Vitals and nursing note reviewed.  Constitutional:      General: He is not in acute distress. Cardiovascular:     Rate and Rhythm: Normal rate and regular rhythm.  Pulmonary:     Effort: Pulmonary effort is normal.     Breath sounds: Normal breath sounds.  Abdominal:     Palpations: Abdomen is soft.     Tenderness: There is no abdominal tenderness.  Musculoskeletal:     Comments: Utilizing walker for stablility  Neurological:     General: No focal deficit present.     Mental Status: He is alert and oriented to person, place, and time.         Assessment & Plan:   Essential hypertension  Lumbar radiculopathy     Return in about 6 months (around 08/18/2024) for follow up, physical.   Alex Raymond, MD

## 2024-03-02 DIAGNOSIS — N529 Male erectile dysfunction, unspecified: Secondary | ICD-10-CM | POA: Diagnosis not present

## 2024-07-02 DIAGNOSIS — N529 Male erectile dysfunction, unspecified: Secondary | ICD-10-CM | POA: Diagnosis not present

## 2024-07-09 ENCOUNTER — Other Ambulatory Visit: Payer: Self-pay | Admitting: Family Medicine

## 2024-07-09 DIAGNOSIS — I10 Essential (primary) hypertension: Secondary | ICD-10-CM

## 2024-08-13 ENCOUNTER — Ambulatory Visit

## 2024-08-14 ENCOUNTER — Telehealth: Payer: Self-pay

## 2024-08-14 NOTE — Telephone Encounter (Signed)
 Copied from CRM [id number]. Topic: General - Other >> Aug 13, 2024 11:23 AM Willma R wrote: Reason for CRM: Patient is calling in regards to his AWV. States every time they try to call his phone doesn't ring, the call goes straight to his voicemail. He missed a call this morning. Contacted CAL who tried to send a teams message to the nurse but didn't get a response. Advised CAL I can only send a CRM to the clinic.   Patient is looking to see if there are any other options to getting through to the nurse for his AWV.  Can be reached at [phone]

## 2024-08-20 ENCOUNTER — Ambulatory Visit (INDEPENDENT_AMBULATORY_CARE_PROVIDER_SITE_OTHER): Admitting: Family Medicine

## 2024-08-20 ENCOUNTER — Encounter: Payer: Self-pay | Admitting: Family Medicine

## 2024-08-20 VITALS — BP 157/95 | HR 93 | Ht 79.0 in | Wt 270.0 lb

## 2024-08-20 DIAGNOSIS — Z13228 Encounter for screening for other metabolic disorders: Secondary | ICD-10-CM | POA: Diagnosis not present

## 2024-08-20 DIAGNOSIS — Z130 Encounter for screening for diseases of the blood and blood-forming organs and certain disorders involving the immune mechanism: Secondary | ICD-10-CM

## 2024-08-20 DIAGNOSIS — Z1329 Encounter for screening for other suspected endocrine disorder: Secondary | ICD-10-CM

## 2024-08-20 DIAGNOSIS — Z136 Encounter for screening for cardiovascular disorders: Secondary | ICD-10-CM

## 2024-08-20 DIAGNOSIS — Z0000 Encounter for general adult medical examination without abnormal findings: Secondary | ICD-10-CM | POA: Diagnosis not present

## 2024-08-20 NOTE — Progress Notes (Signed)
 Established Patient Office Visit  Subjective    [name], male    DOB: 05/22/1961  Age: 63 y.o. MRN: [id number]  CC:  Chief Complaint  Patient presents with   Annual Exam    HPI [name] presents for routine annual exam. Patient denies cute complaints.   Outpatient Encounter Medications as of 08/20/2024  Medication Sig   amLODipine  (NORVASC ) 10 MG tablet Take 1 tablet (10 mg total) by mouth daily.   chlorthalidone  (HYGROTON ) 25 MG tablet TAKE 1 TABLET BY MOUTH EVERY DAY   hydrALAZINE  (APRESOLINE ) 50 MG tablet TAKE 1 TABLET (50 MG) BY MOUTH IN THE MORNING AND AT BEDTIME   irbesartan  (AVAPRO ) 300 MG tablet Take 1 tablet (300 mg total) by mouth daily.   Multiple Vitamins-Minerals (MULTIVITAMIN WITH MINERALS) tablet Take 1 tablet by mouth daily.   tadalafil (CIALIS) 20 MG tablet Take 20 mg by mouth daily as needed. (Patient not taking: Reported on 08/20/2024)   tadalafil (CIALIS) 5 MG tablet Take 5 mg by mouth daily.   No facility-administered encounter medications on file as of 08/20/2024.    Past Medical History:  Diagnosis Date   Hypertension    Neuromuscular disorder (HCC) 2018   pt states spinal cord injury due to spinal surgery, pt uses walker for mobility as of 02/20/21.   Weight loss     Past Surgical History:  Procedure Laterality Date   COLONOSCOPY     LAPAROSCOPIC GASTRIC SLEEVE RESECTION     LUMBAR LAMINECTOMY  12/01/2016   Lumbar decompression L2-5 LUMBAR LAMINECTOMY/DECOMPRESSION MICRODISCECTOMY 3 LEVELS (N/A)   LUMBAR LAMINECTOMY/DECOMPRESSION MICRODISCECTOMY N/A 12/01/2016   Procedure: Lumbar decompression L2-5 LUMBAR LAMINECTOMY/DECOMPRESSION MICRODISCECTOMY 3 LEVELS;  Surgeon: Donaciano Sprang, MD;  Location: [facility] OR;  Service: Orthopedics;  Laterality: N/A;   SPINE SURGERY N/A 2018   02/20/21- pt reports spinal cord injury during laminectemy , pt uses walker now    Family History  Problem Relation Age of Onset   Diabetes Mother     Hypertension Mother    Diabetes Father    Hypertension Father    Colon cancer Neg Hx    Colon polyps Neg Hx    Esophageal cancer Neg Hx    Rectal cancer Neg Hx    Stomach cancer Neg Hx     Social History   Socioeconomic History   Marital status: Married    Spouse name: Not on file   Number of children: 2   Years of education: Not on file   Highest education level: 12th grade  Occupational History   Occupation: Retired  Tobacco Use   Smoking status: Light Smoker    Types: Cigars   Smokeless tobacco: Never   Tobacco comments:    from time to time     Maybe twice a month  Vaping Use   Vaping status: Never Used  Substance and Sexual Activity   Alcohol use: Yes    Alcohol/week: 4.0 standard drinks of alcohol    Types: 2 Cans of beer, 2 Shots of liquor per week    Comment: drinks beer socially at times    Drug use: No   Sexual activity: Yes  Other Topics Concern   Not on file  Social History Narrative   Not on file   Social Drivers of Health   Financial Resource Strain: Low Risk  (08/09/2024)   Overall Financial Resource Strain (CARDIA)    Difficulty of Paying Living Expenses: Not hard at all  Food  Insecurity: No Food Insecurity (08/09/2024)   Hunger Vital Sign    Worried About Running Out of Food in the Last Year: Never true    Ran Out of Food in the Last Year: Never true  Transportation Needs: No Transportation Needs (08/09/2024)   PRAPARE - Administrator, Civil Service (Medical): No    Lack of Transportation (Non-Medical): No  Physical Activity: Sufficiently Active (08/09/2024)   Exercise Vital Sign    Days of Exercise per Week: 5 days    Minutes of Exercise per Session: 120 min  Stress: No Stress Concern Present (08/09/2024)   [employer] of Occupational Health - Occupational Stress Questionnaire    Feeling of Stress: Not at all  Social Connections: Socially Integrated (08/09/2024)   Social Connection and Isolation Panel    Frequency of  Communication with Friends and Family: More than three times a week    Frequency of Social Gatherings with Friends and Family: More than three times a week    Attends Religious Services: More than 4 times per year    Active Member of [employer] or Organizations: Yes    Attends Banker Meetings: More than 4 times per year    Marital Status: Married  Catering manager Violence: Not At Risk (05/05/2023)   Humiliation, Afraid, Rape, and Kick questionnaire    Fear of Current or Ex-Partner: No    Emotionally Abused: No    Physically Abused: No    Sexually Abused: No    Review of Systems  All other systems reviewed and are negative.       Objective    BP (!) 157/95   Pulse 93   Ht 6' 7 (2.007 m)   Wt 270 lb (122.5 kg)   SpO2 95%   BMI 30.42 kg/m   Physical Exam Vitals and nursing note reviewed.  Constitutional:      General: He is not in acute distress. HENT:     Head: Normocephalic and atraumatic.     Right Ear: Tympanic membrane, ear canal and external ear normal.     Left Ear: Tympanic membrane, ear canal and external ear normal.     Nose: Nose normal.     Mouth/Throat:     Mouth: Mucous membranes are moist.     Pharynx: Oropharynx is clear.  Eyes:     Conjunctiva/sclera: Conjunctivae normal.     Pupils: Pupils are equal, round, and reactive to light.  Neck:     Thyroid: No thyromegaly.  Cardiovascular:     Rate and Rhythm: Normal rate and regular rhythm.     Heart sounds: Normal heart sounds. No murmur heard. Pulmonary:     Effort: Pulmonary effort is normal.     Breath sounds: Normal breath sounds.  Abdominal:     General: There is no distension.     Palpations: Abdomen is soft. There is no mass.     Tenderness: There is no abdominal tenderness.     Hernia: There is no hernia in the left inguinal area or right inguinal area.  Genitourinary:    Testes: Normal.  Musculoskeletal:        General: Normal range of motion.     Cervical back: Normal range  of motion and neck supple.     Right lower leg: No edema.     Left lower leg: No edema.     Comments: Utilizing walker for stabilization  Skin:    General: Skin is warm and dry.  Neurological:     General: No focal deficit present.     Mental Status: He is alert and oriented to person, place, and time. Mental status is at baseline.     Motor: Weakness (bilateral lowere extremities) present.     Gait: Gait abnormal.  Psychiatric:        Mood and Affect: Mood normal.        Behavior: Behavior normal.         Assessment & Plan:   Annual physical exam -     Comprehensive metabolic panel with GFR  Screening for deficiency anemia -     CBC with Differential/Platelet  Encounter for screening for cardiovascular disorders -     Lipid panel  Screening for endocrine/metabolic/immunity disorders -     Hemoglobin A1c -     VITAMIN D  25 Hydroxy (Vit-D Deficiency, Fractures)     Return in about 3 months (around 11/19/2024) for follow up, chronic med issues.   Tanda Raguel SQUIBB, MD

## 2024-08-21 LAB — CBC WITH DIFFERENTIAL/PLATELET
Basophils Absolute: 0 x10E3/uL (ref 0.0–0.2)
Basos: 1 %
EOS (ABSOLUTE): 0.1 x10E3/uL (ref 0.0–0.4)
Eos: 2 %
Hematocrit: 49.9 % (ref 37.5–51.0)
Hemoglobin: 16 g/dL (ref 13.0–17.7)
Immature Grans (Abs): 0 x10E3/uL (ref 0.0–0.1)
Immature Granulocytes: 0 %
Lymphocytes Absolute: 2.2 x10E3/uL (ref 0.7–3.1)
Lymphs: 35 %
MCH: 29.3 pg (ref 26.6–33.0)
MCHC: 32.1 g/dL (ref 31.5–35.7)
MCV: 91 fL (ref 79–97)
Monocytes Absolute: 0.5 x10E3/uL (ref 0.1–0.9)
Monocytes: 8 %
Neutrophils Absolute: 3.5 x10E3/uL (ref 1.4–7.0)
Neutrophils: 54 %
Platelets: 179 x10E3/uL (ref 150–450)
RBC: 5.47 x10E6/uL (ref 4.14–5.80)
RDW: 13.7 % (ref 11.6–15.4)
WBC: 6.4 x10E3/uL (ref 3.4–10.8)

## 2024-08-21 LAB — COMPREHENSIVE METABOLIC PANEL WITH GFR
ALT: 22 IU/L (ref 0–44)
AST: 25 IU/L (ref 0–40)
Albumin: 4.4 g/dL (ref 3.9–4.9)
Alkaline Phosphatase: 63 IU/L (ref 47–123)
BUN/Creatinine Ratio: 18 (ref 10–24)
BUN: 19 mg/dL (ref 8–27)
Bilirubin Total: 1.2 mg/dL (ref 0.0–1.2)
CO2: 26 mmol/L (ref 20–29)
Calcium: 10.2 mg/dL (ref 8.6–10.2)
Chloride: 101 mmol/L (ref 96–106)
Creatinine, Ser: 1.08 mg/dL (ref 0.76–1.27)
Globulin, Total: 2.3 g/dL (ref 1.5–4.5)
Glucose: 96 mg/dL (ref 70–99)
Potassium: 3.6 mmol/L (ref 3.5–5.2)
Sodium: 140 mmol/L (ref 134–144)
Total Protein: 6.7 g/dL (ref 6.0–8.5)
eGFR: 77 mL/min/1.73 (ref >59)

## 2024-08-21 LAB — LIPID PANEL
Chol/HDL Ratio: 2.9 ratio (ref 0.0–5.0)
Cholesterol, Total: 191 mg/dL (ref 100–199)
HDL: 65 mg/dL (ref >39)
LDL Chol Calc (NIH): 115 mg/dL — ABNORMAL HIGH (ref 0–99)
Triglycerides: 61 mg/dL (ref 0–149)
VLDL Cholesterol Cal: 11 mg/dL (ref 5–40)

## 2024-08-21 LAB — HEMOGLOBIN A1C
Est. average glucose Bld gHb Est-mCnc: 117 mg/dL
Hgb A1c MFr Bld: 5.7 % — ABNORMAL HIGH (ref 4.8–5.6)

## 2024-08-21 LAB — VITAMIN D 25 HYDROXY (VIT D DEFICIENCY, FRACTURES): Vit D, 25-Hydroxy: 32.8 ng/mL (ref 30.0–100.0)

## 2024-08-22 ENCOUNTER — Other Ambulatory Visit: Payer: Self-pay | Admitting: Family Medicine

## 2024-08-22 DIAGNOSIS — I10 Essential (primary) hypertension: Secondary | ICD-10-CM

## 2024-08-22 NOTE — Telephone Encounter (Signed)
 Patient has an appt on 11/27/24

## 2024-08-23 ENCOUNTER — Ambulatory Visit: Payer: Self-pay | Admitting: Family Medicine

## 2024-10-06 ENCOUNTER — Other Ambulatory Visit: Payer: Self-pay | Admitting: Family Medicine

## 2024-10-06 DIAGNOSIS — I10 Essential (primary) hypertension: Secondary | ICD-10-CM

## 2024-10-11 ENCOUNTER — Ambulatory Visit (INDEPENDENT_AMBULATORY_CARE_PROVIDER_SITE_OTHER)

## 2024-10-11 DIAGNOSIS — Z0000 Encounter for general adult medical examination without abnormal findings: Secondary | ICD-10-CM | POA: Diagnosis not present

## 2024-10-11 NOTE — Progress Notes (Signed)
 Chief Complaint  Patient presents with   Medicare Wellness     Subjective:   [name] is a 63 y.o. male who presents for a Medicare Annual Wellness Visit.  Allergies (verified) Patient has no known allergies.   History: Past Medical History:  Diagnosis Date   Hypertension    Neuromuscular disorder (HCC) 2018   pt states spinal cord injury due to spinal surgery, pt uses walker for mobility as of 02/20/21.   Weight loss    Past Surgical History:  Procedure Laterality Date   COLONOSCOPY     LAPAROSCOPIC GASTRIC SLEEVE RESECTION     LUMBAR LAMINECTOMY  12/01/2016   Lumbar decompression L2-5 LUMBAR LAMINECTOMY/DECOMPRESSION MICRODISCECTOMY 3 LEVELS (N/A)   LUMBAR LAMINECTOMY/DECOMPRESSION MICRODISCECTOMY N/A 12/01/2016   Procedure: Lumbar decompression L2-5 LUMBAR LAMINECTOMY/DECOMPRESSION MICRODISCECTOMY 3 LEVELS;  Surgeon: Donaciano Sprang, MD;  Location: [facility] OR;  Service: Orthopedics;  Laterality: N/A;   SPINE SURGERY N/A 2018   02/20/21- pt reports spinal cord injury during laminectemy , pt uses walker now   Family History  Problem Relation Age of Onset   Diabetes Mother    Hypertension Mother    Diabetes Father    Hypertension Father    Colon cancer Neg Hx    Colon polyps Neg Hx    Esophageal cancer Neg Hx    Rectal cancer Neg Hx    Stomach cancer Neg Hx    Social History   Occupational History   Occupation: Retired  Tobacco Use   Smoking status: Light Smoker    Types: Cigars   Smokeless tobacco: Never   Tobacco comments:    from time to time     Maybe twice a month  Vaping Use   Vaping status: Never Used  Substance and Sexual Activity   Alcohol use: Yes    Alcohol/week: 4.0 standard drinks of alcohol    Types: 2 Cans of beer, 2 Shots of liquor per week    Comment: drinks beer socially at times    Drug use: No   Sexual activity: Yes   Tobacco Counseling Ready to quit: Not Answered Counseling given: Not Answered Tobacco comments: from time to time   Maybe twice a month  SDOH Screenings   Food Insecurity: No Food Insecurity (10/07/2024)  Housing: Low Risk  (10/07/2024)  Transportation Needs: No Transportation Needs (10/07/2024)  Utilities: Not At Risk (10/11/2024)  Alcohol Screen: Low Risk  (10/07/2024)  Depression (PHQ2-9): Low Risk  (10/11/2024)  Financial Resource Strain: Low Risk  (10/07/2024)  Physical Activity: Sufficiently Active (10/07/2024)  Social Connections: Socially Integrated (10/07/2024)  Stress: No Stress Concern Present (10/07/2024)  Tobacco Use: High Risk (10/11/2024)  Health Literacy: Adequate Health Literacy (10/11/2024)   See flowsheets for full screening details  Depression Screen PHQ 2 & 9 Depression Scale- Over the past 2 weeks, how often have you been bothered by any of the following problems? Little interest or pleasure in doing things: 0 Feeling down, depressed, or hopeless (PHQ Adolescent also includes...irritable): 0 PHQ-2 Total Score: 0 Trouble falling or staying asleep, or sleeping too much: 0 Feeling tired or having little energy: 0 Poor appetite or overeating (PHQ Adolescent also includes...weight loss): 0 Feeling bad about yourself - or that you are a failure or have let yourself or your family down: 0 Trouble concentrating on things, such as reading the newspaper or watching television (PHQ Adolescent also includes...like school work): 0 Moving or speaking so slowly that other people could have noticed. Or the opposite -  being so fidgety or restless that you have been moving around a lot more than usual: 0 Thoughts that you would be better off dead, or of hurting yourself in some way: 0 PHQ-9 Total Score: 0 If you checked off any problems, how difficult have these problems made it for you to do your work, take care of things at home, or get along with other people?: Not difficult at all  Depression Treatment Depression Interventions/Treatment : EYV7-0 Score <4 Follow-up Not Indicated      Goals Addressed             This Visit's Progress    Patient Stated       10/11/2024, stay healthy       Visit info / Clinical Intake: Medicare Wellness Visit Type:: Subsequent Annual Wellness Visit Persons participating in visit:: patient Medicare Wellness Visit Mode:: Video Because this visit was a virtual/telehealth visit:: unable to obtan vitals due to lack of equipment If Telephone or Video please confirm:: I connected with the patient using audio enabled telemedicine application and verified that I am speaking with the correct person using two identifiers; I discussed the limitations of evaluation and management by telemedicine; The patient expressed understanding and agreed to proceed Patient Location:: home Provider Location:: office Information given by:: patient Interpreter Needed?: No Pre-visit prep was completed: yes AWV questionnaire completed by patient prior to visit?: yes Date:: 10/07/24 Living arrangements:: lives with spouse/significant other Patient's Overall Health Status Rating: excellent Typical amount of pain: none Does pain affect daily life?: no Are you currently prescribed opioids?: no  Dietary Habits and Nutritional Risks How many meals a day?: 2 Eats fruit and vegetables daily?: yes Most meals are obtained by: preparing own meals; eating out In the last 2 weeks, have you had any of the following?: none Diabetic:: no  Functional Status Activities of Daily Living (to include ambulation/medication): Independent Ambulation: Independent Medication Administration: Independent Home Management: Independent Manage your own finances?: yes Primary transportation is: driving Concerns about vision?: no *vision screening is required for WTM* Concerns about hearing?: no  Fall Screening Falls in the past year?: 0 Number of falls in past year: 0 Was there an injury with Fall?: 0 Fall Risk Category Calculator: 0 Patient Fall Risk Level: Low Fall  Risk  Fall Risk Patient at Risk for Falls Due to: Medication side effect Fall risk Follow up: Falls evaluation completed; Falls prevention discussed  Home and Transportation Safety: All rugs have non-skid backing?: yes All stairs or steps have railings?: yes Grab bars in the bathtub or shower?: yes Have non-skid surface in bathtub or shower?: yes Good home lighting?: yes Regular seat belt use?: yes Hospital stays in the last year:: no  Cognitive Assessment Difficulty concentrating, remembering, or making decisions? : no Will 6CIT or Mini Cog be Completed: yes What year is it?: 0 points What month is it?: 0 points Give patient an address phrase to remember (5 components): [address] About what time is it?: 0 points Count backwards from 20 to 1: 0 points Say the months of the year in reverse: 0 points Repeat the address phrase from earlier: 0 points 6 CIT Score: 0 points  Advance Directives (For Healthcare) Does Patient Have a Medical Advance Directive?: Yes Type of Advance Directive: Healthcare Power of [place]; Living will Copy of Healthcare Power of Attorney in Chart?: No - copy requested Copy of Living Will in Chart?: No - copy requested  Reviewed/Updated  Reviewed/Updated: Reviewed All (Medical, Surgical,  Family, Medications, Allergies, Care Teams, Patient Goals)        Objective:    Today's Vitals   There is no height or weight on file to calculate BMI.  Current Medications (verified) Outpatient Encounter Medications as of 10/11/2024  Medication Sig   amLODipine  (NORVASC ) 10 MG tablet TAKE 1 TABLET BY MOUTH EVERY DAY   chlorthalidone  (HYGROTON ) 25 MG tablet TAKE 1 TABLET BY MOUTH EVERY DAY   hydrALAZINE  (APRESOLINE ) 50 MG tablet TAKE 1 TABLET (50 MG) BY MOUTH IN THE MORNING AND AT BEDTIME   irbesartan  (AVAPRO ) 300 MG tablet TAKE 1 TABLET BY MOUTH EVERY DAY   Multiple Vitamins-Minerals (MULTIVITAMIN WITH MINERALS) tablet Take 1 tablet by mouth daily.    tadalafil (CIALIS) 20 MG tablet Take 20 mg by mouth daily as needed. (Patient not taking: Reported on 08/20/2024)   No facility-administered encounter medications on file as of 10/11/2024.   Hearing/Vision screen Hearing Screening - Comments:: Denies hearing issues Vision Screening - Comments:: Regular eye exams, [facility] Immunizations and Health Maintenance Health Maintenance  Topic Date Due   Pneumococcal Vaccine: 50+ Years (1 of 2 - PCV) Never done   COVID-19 Vaccine (4 - 2025-26 season) 07/23/2024   Medicare Annual Wellness (AWV)  10/11/2025   DTaP/Tdap/Td (2 - Td or Tdap) 11/22/2025   Colonoscopy  09/21/2028   Hepatitis C Screening  Completed   HIV Screening  Completed   Hepatitis B Vaccines 19-59 Average Risk  Aged Out   HPV VACCINES  Aged Out   Meningococcal B Vaccine  Aged Out   Influenza Vaccine  Discontinued   Zoster Vaccines- Shingrix  Discontinued        Assessment/Plan:  This is a routine wellness examination for Cas.  Patient Care Team: Tanda Bleacher, MD as PCP - [facility], [facility]   I have personally reviewed and noted the following in the patient's chart:   Medical and social history Use of alcohol, tobacco or illicit drugs  Current medications and supplements including opioid prescriptions. Functional ability and status Nutritional status Physical activity Advanced directives List of other physicians Hospitalizations, surgeries, and ER visits in previous 12 months Vitals Screenings to include cognitive, depression, and falls Referrals and appointments  No orders of the defined types were placed in this encounter.  In addition, I have reviewed and discussed with patient certain preventive protocols, quality metrics, and best practice recommendations. A written personalized care plan for preventive services as well as general preventive health recommendations were provided to patient.   Ardella FORBES Dawn, LPN   [id number]/79/7974   Return in 1 year (on 10/11/2025).  After Visit Summary: (MyChart) Due to this being a telephonic visit, the after visit summary with patients personalized plan was offered to patient via MyChart   Nurse Notes: Declines vaccines.

## 2024-10-11 NOTE — Patient Instructions (Signed)
 [name],  Thank you for taking the time for your Medicare Wellness Visit. I appreciate your continued commitment to your health goals. Please review the care plan we discussed, and feel free to reach out if I can assist you further.  Please note that Annual Wellness Visits do not include a physical exam. Some assessments may be limited, especially if the visit was conducted virtually. If needed, we may recommend an in-person follow-up with your provider.  Ongoing Care Seeing your primary care provider every 3 to 6 months helps us  monitor your health and provide consistent, personalized care.   Referrals If a referral was made during today's visit and you haven't received any updates within two weeks, please contact the referred provider directly to check on the status.  Recommended Screenings:  Health Maintenance  Topic Date Due   Pneumococcal Vaccine for age over 59 (1 of 2 - PCV) Never done   Medicare Annual Wellness Visit  05/04/2024   COVID-19 Vaccine (4 - 2025-26 season) 07/23/2024   DTaP/Tdap/Td vaccine (2 - Td or Tdap) 11/22/2025   Colon Cancer Screening  09/21/2028   Hepatitis C Screening  Completed   HIV Screening  Completed   Hepatitis B Vaccine  Aged Out   HPV Vaccine  Aged Out   Meningitis B Vaccine  Aged Out   Flu Shot  Discontinued   Zoster (Shingles) Vaccine  Discontinued       10/11/2024   10:05 AM  Advanced Directives  Does Patient Have a Medical Advance Directive? Yes  Type of Estate Agent of [place];Living will  Copy of Healthcare Power of Attorney in Chart? No - copy requested    Vision: Annual vision screenings are recommended for early detection of glaucoma, cataracts, and diabetic retinopathy. These exams can also reveal signs of chronic conditions such as diabetes and high blood pressure.  Dental: Annual dental screenings help detect early signs of oral cancer, gum disease, and other conditions linked to overall health,  including heart disease and diabetes.  Please see the attached documents for additional preventive care recommendations.

## 2024-11-02 NOTE — Progress Notes (Signed)
 LONNIE RETH                                          MRN: [id number]   11/02/2024   The [facility] Quality Team Specialist reviewed this patient medical record for the purposes of chart review for care gap closure. The following were reviewed: abstraction for care gap closure-controlling blood pressure.    [facility] Quality Team

## 2024-11-27 ENCOUNTER — Ambulatory Visit: Admitting: Family Medicine

## 2024-12-19 NOTE — Progress Notes (Signed)
 DOVID BARTKO                                          MRN: [id number]   12/19/2024   The [facility] Quality Team Specialist reviewed this patient medical record for the purposes of chart review for care gap closure. The following were reviewed: chart review for care gap closure-controlling blood pressure.    [facility] Quality Team

## 2025-01-08 ENCOUNTER — Ambulatory Visit: Admitting: Family Medicine

## 2025-10-24 ENCOUNTER — Ambulatory Visit: Payer: Self-pay
